# Patient Record
Sex: Male | Born: 1960
Health system: Southern US, Community
[De-identification: ages and names within clinical notes are randomized; demographics above are authoritative.]

## PROBLEM LIST (undated history)

## (undated) DIAGNOSIS — M0630 Rheumatoid nodule, unspecified site: Secondary | ICD-10-CM

## (undated) DIAGNOSIS — D696 Thrombocytopenia, unspecified: Secondary | ICD-10-CM

## (undated) DIAGNOSIS — Z973 Presence of spectacles and contact lenses: Secondary | ICD-10-CM

## (undated) DIAGNOSIS — R7303 Prediabetes: Secondary | ICD-10-CM

## (undated) DIAGNOSIS — E119 Type 2 diabetes mellitus without complications: Secondary | ICD-10-CM

## (undated) DIAGNOSIS — G629 Polyneuropathy, unspecified: Secondary | ICD-10-CM

## (undated) DIAGNOSIS — N529 Male erectile dysfunction, unspecified: Secondary | ICD-10-CM

## (undated) DIAGNOSIS — M8580 Other specified disorders of bone density and structure, unspecified site: Secondary | ICD-10-CM

## (undated) DIAGNOSIS — R7989 Other specified abnormal findings of blood chemistry: Secondary | ICD-10-CM

## (undated) DIAGNOSIS — M0510 Rheumatoid lung disease with rheumatoid arthritis of unspecified site: Secondary | ICD-10-CM

## (undated) DIAGNOSIS — M8589 Other specified disorders of bone density and structure, multiple sites: Secondary | ICD-10-CM

## (undated) DIAGNOSIS — K219 Gastro-esophageal reflux disease without esophagitis: Secondary | ICD-10-CM

## (undated) DIAGNOSIS — M159 Polyosteoarthritis, unspecified: Secondary | ICD-10-CM

## (undated) DIAGNOSIS — S92302G Fracture of unspecified metatarsal bone(s), left foot, subsequent encounter for fracture with delayed healing: Secondary | ICD-10-CM

## (undated) DIAGNOSIS — I499 Cardiac arrhythmia, unspecified: Secondary | ICD-10-CM

## (undated) DIAGNOSIS — K635 Polyp of colon: Secondary | ICD-10-CM

## (undated) DIAGNOSIS — E785 Hyperlipidemia, unspecified: Secondary | ICD-10-CM

## (undated) DIAGNOSIS — S82891A Other fracture of right lower leg, initial encounter for closed fracture: Secondary | ICD-10-CM

## (undated) DIAGNOSIS — M069 Rheumatoid arthritis, unspecified: Secondary | ICD-10-CM

## (undated) DIAGNOSIS — Z79899 Other long term (current) drug therapy: Secondary | ICD-10-CM

## (undated) DIAGNOSIS — E559 Vitamin D deficiency, unspecified: Secondary | ICD-10-CM

## (undated) DIAGNOSIS — E663 Overweight: Secondary | ICD-10-CM

## (undated) HISTORY — PX: COLONOSCOPY: SHX5424

## (undated) HISTORY — DX: Presence of spectacles and contact lenses: Z97.3

## (undated) HISTORY — DX: Other long term (current) drug therapy: Z79.899

## (undated) HISTORY — DX: Cardiac arrhythmia, unspecified: I49.9

## (undated) HISTORY — DX: Thrombocytopenia, unspecified: D69.6

## (undated) HISTORY — DX: Polyneuropathy, unspecified: G62.9

## (undated) HISTORY — DX: Overweight: E66.3

## (undated) HISTORY — DX: Prediabetes: R73.03

## (undated) HISTORY — DX: Rheumatoid nodule, unspecified site: M06.30

## (undated) HISTORY — DX: Male erectile dysfunction, unspecified: N52.9

## (undated) HISTORY — DX: Other specified abnormal findings of blood chemistry: R79.89

## (undated) HISTORY — DX: Hyperlipidemia, unspecified: E78.5

## (undated) HISTORY — DX: Polyp of colon: K63.5

## (undated) HISTORY — DX: Other specified disorders of bone density and structure, multiple sites: M85.89

## (undated) HISTORY — DX: Rheumatoid arthritis, unspecified: M06.9

## (undated) HISTORY — DX: Rheumatoid lung disease with rheumatoid arthritis of unspecified site: M05.10

## (undated) HISTORY — DX: Other fracture of right lower leg, initial encounter for closed fracture: S82.891A

## (undated) HISTORY — DX: Gastro-esophageal reflux disease without esophagitis: K21.9

## (undated) HISTORY — DX: Type 2 diabetes mellitus without complications: E11.9

## (undated) HISTORY — DX: Fracture of unspecified metatarsal bone(s), left foot, subsequent encounter for fracture with delayed healing: S92.302G

## (undated) HISTORY — PX: NO PAST SURGERIES: SHX2092

## (undated) HISTORY — DX: Polyosteoarthritis, unspecified: M15.9

## (undated) HISTORY — DX: Other specified disorders of bone density and structure, unspecified site: M85.80

## (undated) HISTORY — DX: Vitamin D deficiency, unspecified: E55.9

## (undated) HISTORY — PX: COLONOSCOPY: SHX174

---

## 1996-07-09 DIAGNOSIS — M069 Rheumatoid arthritis, unspecified: Secondary | ICD-10-CM | POA: Insufficient documentation

## 1996-07-09 HISTORY — DX: Rheumatoid arthritis, unspecified: M06.9

## 2007-07-10 DIAGNOSIS — G629 Polyneuropathy, unspecified: Secondary | ICD-10-CM

## 2007-07-10 DIAGNOSIS — I701 Atherosclerosis of renal artery: Secondary | ICD-10-CM | POA: Insufficient documentation

## 2007-07-10 HISTORY — DX: Polyneuropathy, unspecified: G62.9

## 2009-07-09 DIAGNOSIS — K635 Polyp of colon: Secondary | ICD-10-CM

## 2009-07-09 HISTORY — DX: Polyp of colon: K63.5

## 2010-08-10 ENCOUNTER — Ambulatory Visit (HOSPITAL_COMMUNITY): Payer: 59 | Attending: Internal Medicine

## 2010-08-10 DIAGNOSIS — M069 Rheumatoid arthritis, unspecified: Secondary | ICD-10-CM | POA: Insufficient documentation

## 2010-09-07 ENCOUNTER — Other Ambulatory Visit: Payer: Self-pay | Admitting: Internal Medicine

## 2010-09-07 ENCOUNTER — Encounter (HOSPITAL_COMMUNITY): Payer: 59 | Attending: Internal Medicine

## 2010-09-07 DIAGNOSIS — M069 Rheumatoid arthritis, unspecified: Secondary | ICD-10-CM | POA: Insufficient documentation

## 2010-09-07 LAB — CBC
MCH: 30.7 pg (ref 26.0–34.0)
MCHC: 34.3 g/dL (ref 30.0–36.0)
MCV: 89.4 fL (ref 78.0–100.0)
Platelets: 132 10*3/uL — ABNORMAL LOW (ref 150–400)
RDW: 13 % (ref 11.5–15.5)

## 2010-09-07 LAB — COMPREHENSIVE METABOLIC PANEL
AST: 28 U/L (ref 0–37)
Albumin: 3.7 g/dL (ref 3.5–5.2)
BUN: 16 mg/dL (ref 6–23)
Calcium: 8.9 mg/dL (ref 8.4–10.5)
Chloride: 109 mEq/L (ref 96–112)
Creatinine, Ser: 1 mg/dL (ref 0.4–1.5)
GFR calc Af Amer: >60 mL/min (ref >60)
Total Bilirubin: 1 mg/dL (ref 0.3–1.2)
Total Protein: 6.5 g/dL (ref 6.0–8.3)

## 2010-10-05 ENCOUNTER — Encounter (HOSPITAL_COMMUNITY): Payer: 59 | Attending: Internal Medicine

## 2010-10-05 DIAGNOSIS — M069 Rheumatoid arthritis, unspecified: Secondary | ICD-10-CM | POA: Insufficient documentation

## 2010-11-02 ENCOUNTER — Encounter (HOSPITAL_COMMUNITY): Payer: 59 | Attending: Internal Medicine

## 2010-11-02 ENCOUNTER — Other Ambulatory Visit: Payer: Self-pay | Admitting: Internal Medicine

## 2010-11-02 DIAGNOSIS — M069 Rheumatoid arthritis, unspecified: Secondary | ICD-10-CM | POA: Insufficient documentation

## 2010-11-02 LAB — COMPREHENSIVE METABOLIC PANEL
ALT: 41 U/L (ref 0–53)
AST: 32 U/L (ref 0–37)
CO2: 27 mEq/L (ref 19–32)
Chloride: 108 mEq/L (ref 96–112)
Creatinine, Ser: 1.17 mg/dL (ref 0.4–1.5)
GFR calc Af Amer: >60 mL/min (ref >60)
GFR calc non Af Amer: >60 mL/min (ref >60)
Glucose, Bld: 126 mg/dL — ABNORMAL HIGH (ref 70–99)
Sodium: 142 mEq/L (ref 135–145)
Total Bilirubin: 0.8 mg/dL (ref 0.3–1.2)

## 2010-11-02 LAB — CBC
HCT: 44.3 % (ref 39.0–52.0)
Hemoglobin: 15.6 g/dL (ref 13.0–17.0)
MCH: 30.6 pg (ref 26.0–34.0)
RBC: 5.09 MIL/uL (ref 4.22–5.81)

## 2010-11-30 ENCOUNTER — Encounter (HOSPITAL_COMMUNITY): Payer: 59

## 2010-12-06 ENCOUNTER — Encounter (HOSPITAL_COMMUNITY): Payer: 59 | Attending: Internal Medicine

## 2010-12-06 DIAGNOSIS — M069 Rheumatoid arthritis, unspecified: Secondary | ICD-10-CM | POA: Insufficient documentation

## 2011-01-03 ENCOUNTER — Ambulatory Visit (HOSPITAL_COMMUNITY): Payer: 59 | Attending: Internal Medicine

## 2011-01-03 DIAGNOSIS — M069 Rheumatoid arthritis, unspecified: Secondary | ICD-10-CM | POA: Insufficient documentation

## 2011-01-31 ENCOUNTER — Encounter (HOSPITAL_COMMUNITY): Payer: 59

## 2011-07-10 DIAGNOSIS — M8580 Other specified disorders of bone density and structure, unspecified site: Secondary | ICD-10-CM | POA: Insufficient documentation

## 2011-07-10 DIAGNOSIS — R7989 Other specified abnormal findings of blood chemistry: Secondary | ICD-10-CM

## 2011-07-10 DIAGNOSIS — D696 Thrombocytopenia, unspecified: Secondary | ICD-10-CM

## 2011-07-10 DIAGNOSIS — N529 Male erectile dysfunction, unspecified: Secondary | ICD-10-CM

## 2011-07-10 DIAGNOSIS — K219 Gastro-esophageal reflux disease without esophagitis: Secondary | ICD-10-CM | POA: Insufficient documentation

## 2011-07-10 HISTORY — DX: Gastro-esophageal reflux disease without esophagitis: K21.9

## 2011-07-10 HISTORY — DX: Male erectile dysfunction, unspecified: N52.9

## 2011-07-10 HISTORY — DX: Other specified abnormal findings of blood chemistry: R79.89

## 2011-07-10 HISTORY — DX: Other specified disorders of bone density and structure, unspecified site: M85.80

## 2011-07-10 HISTORY — DX: Thrombocytopenia, unspecified: D69.6

## 2014-03-09 DIAGNOSIS — S92302G Fracture of unspecified metatarsal bone(s), left foot, subsequent encounter for fracture with delayed healing: Secondary | ICD-10-CM

## 2014-03-09 HISTORY — DX: Fracture of unspecified metatarsal bone(s), left foot, subsequent encounter for fracture with delayed healing: S92.302G

## 2016-01-25 LAB — PSA: PSA: 0.6

## 2016-01-27 LAB — MICROALBUMIN, URINE: Microalb, Ur: 3

## 2016-04-18 LAB — TSH: TSH: 1.9 u[IU]/mL (ref <=5.90)

## 2016-07-30 DIAGNOSIS — E119 Type 2 diabetes mellitus without complications: Secondary | ICD-10-CM | POA: Diagnosis not present

## 2016-07-30 DIAGNOSIS — E785 Hyperlipidemia, unspecified: Secondary | ICD-10-CM | POA: Diagnosis not present

## 2016-07-30 LAB — BASIC METABOLIC PANEL
BUN: 20 mg/dL (ref 4–21)
CREATININE: 0.9 mg/dL (ref 0.6–1.3)
Glucose: 170 mg/dL

## 2016-07-30 LAB — LIPID PANEL
Cholesterol: 190 mg/dL (ref 0–200)
HDL: 35 mg/dL (ref 35–70)
LDL Cholesterol: 104 mg/dL
Triglycerides: 253 mg/dL — AB (ref 40–160)

## 2016-07-30 LAB — HEMOGLOBIN A1C: HEMOGLOBIN A1C: 6.9

## 2016-08-09 DIAGNOSIS — E119 Type 2 diabetes mellitus without complications: Secondary | ICD-10-CM | POA: Diagnosis not present

## 2016-08-09 DIAGNOSIS — Z23 Encounter for immunization: Secondary | ICD-10-CM | POA: Diagnosis not present

## 2016-08-09 DIAGNOSIS — M8580 Other specified disorders of bone density and structure, unspecified site: Secondary | ICD-10-CM | POA: Diagnosis not present

## 2016-08-09 DIAGNOSIS — E785 Hyperlipidemia, unspecified: Secondary | ICD-10-CM | POA: Diagnosis not present

## 2016-11-21 ENCOUNTER — Ambulatory Visit (INDEPENDENT_AMBULATORY_CARE_PROVIDER_SITE_OTHER): Payer: 59 | Admitting: Family Medicine

## 2016-11-21 ENCOUNTER — Encounter: Payer: Self-pay | Admitting: Family Medicine

## 2016-11-21 ENCOUNTER — Ambulatory Visit: Payer: Self-pay | Admitting: Family Medicine

## 2016-11-21 VITALS — BP 114/75 | HR 53 | Temp 98.1°F | Resp 20 | Ht 72.0 in | Wt 216.0 lb

## 2016-11-21 DIAGNOSIS — E785 Hyperlipidemia, unspecified: Secondary | ICD-10-CM

## 2016-11-21 DIAGNOSIS — Z6829 Body mass index (BMI) 29.0-29.9, adult: Secondary | ICD-10-CM

## 2016-11-21 DIAGNOSIS — E559 Vitamin D deficiency, unspecified: Secondary | ICD-10-CM

## 2016-11-21 DIAGNOSIS — E119 Type 2 diabetes mellitus without complications: Secondary | ICD-10-CM | POA: Diagnosis not present

## 2016-11-21 DIAGNOSIS — R7303 Prediabetes: Secondary | ICD-10-CM

## 2016-11-21 DIAGNOSIS — Z79899 Other long term (current) drug therapy: Secondary | ICD-10-CM | POA: Diagnosis not present

## 2016-11-21 DIAGNOSIS — M8580 Other specified disorders of bone density and structure, unspecified site: Secondary | ICD-10-CM | POA: Diagnosis not present

## 2016-11-21 DIAGNOSIS — D696 Thrombocytopenia, unspecified: Secondary | ICD-10-CM | POA: Diagnosis not present

## 2016-11-21 DIAGNOSIS — G609 Hereditary and idiopathic neuropathy, unspecified: Secondary | ICD-10-CM

## 2016-11-21 DIAGNOSIS — M059 Rheumatoid arthritis with rheumatoid factor, unspecified: Secondary | ICD-10-CM

## 2016-11-21 DIAGNOSIS — K219 Gastro-esophageal reflux disease without esophagitis: Secondary | ICD-10-CM

## 2016-11-21 NOTE — Patient Instructions (Signed)
It was a pleasure to meet you! I will call you with lab results. I have referred you to rheumatology for your RA.      Please help [place] help you:  We are honored you have chosen [facility] for your Primary Care home. Below you will find basic instructions that you may need to access in the future. Please help [place] help you by reading the instructions, which cover many of the frequent questions we experience.   Prescription refills and request:  -In order to allow more efficient response time, please call your pharmacy for all refills. They will forward the request electronically to [place]. This allows for the quickest possible response. Request left on a nurse line can take longer to refill, since these are checked as time allows between office patients and other phone calls.  - refill request can take up to 3-5 working days to complete.  - If request is sent electronically and request is appropiate, it is usually completed in 1-2 business days.  - all patients will need to be seen routinely for all chronic medical conditions requiring prescription medications (see follow-up below). If you are overdue for follow up on your condition, you will be asked to make an appointment and we will call in enough medication to cover you until your appointment (up to 30 days).  - all controlled substances will require a face to face visit to request/refill.  - if you desire your prescriptions to go through a new pharmacy, and have an active script at original pharmacy, you will need to call your pharmacy and have scripts transferred to new pharmacy. This is completed between the pharmacy locations and not by your provider.    Results: If any images or labs were ordered, it can take up to 1 week to get results depending on the test ordered and the lab/facility running and resulting the test. - Normal or stable results, which do not need further discussion, may be released to your mychart immediately with  attached note to you. A call may not be generated for normal results. Please make certain to sign up for mychart. If you have questions on how to activate your mychart you can call the front office.  - If your results need further discussion, our office will attempt to contact you via phone, and if unable to reach you after 2 attempts, we will release your abnormal result to your mychart with instructions.  - All results will be automatically released in mychart after 1 week.  - Your provider will provide you with explanation and instruction on all relevant material in your results. Please keep in mind, results and labs may appear confusing or abnormal to the untrained eye, but it does not mean they are actually abnormal for you personally. If you have any questions about your results that are not covered, or you desire more detailed explanation than what was provided, you should make an appointment with your provider to do so.   Our office handles many outgoing and incoming calls daily. If we have not contacted you within 1 week about your results, please check your mychart to see if there is a message first and if not, then contact our office.  In helping with this matter, you help decrease call volume, and therefore allow [place] to be able to respond to patients needs more efficiently.   Acute office visits (sick visit):  An acute visit is intended for a new problem and are scheduled in  shorter time slots to allow schedule openings for patients with new problems. This is the appropriate visit to discuss a new problem. In order to provide you with excellent quality medical care with proper time for you to explain your problem, have an exam and receive treatment with instructions, these appointments should be limited to one new problem per visit. If you experience a new problem, in which you desire to be addressed, please make an acute office visit, we save openings on the schedule to accommodate you. Please do  not save your new problem for any other type of visit, let us take care of it properly and quickly for you.   Follow up visits:  Depending on your condition(s) your provider will need to see you routinely in order to provide you with quality care and prescribe medication(s). Most chronic conditions (Example: hypertension, Diabetes, depression/anxiety... etc), require visits a couple times a year. Your provider will instruct you on proper follow up for your personal medical conditions and history. Please make certain to make follow up appointments for your condition as instructed. Failing to do so could result in lapse in your medication treatment/refills. If you request a refill, and are overdue to be seen on a condition, we will always provide you with a 30 day script (once) to allow you time to schedule.    Medicare wellness (well visit): - we have a wonderful Nurse Maudie Mercury), that will meet with you and provide you will yearly medicare wellness visits. These visits should occur yearly (can not be scheduled less than 1 calendar year apart) and cover preventive health, immunizations, advance directives and screenings you are entitled to yearly through your medicare benefits. Do not miss out on your entitled benefits, this is when medicare will pay for these benefits to be ordered for you.  These are strongly encouraged by your provider and is the appropriate type of visit to make certain you are up to date with all preventive health benefits. If you have not had your medicare wellness exam in the last 12 months, please make certain to schedule one by calling the office and schedule your medicare wellness with Maudie Mercury as soon as possible.   Yearly physical (well visit):  - Adults are recommended to be seen yearly for physicals. Check with your insurance and date of your last physical, most insurances require one calendar year between physicals. Physicals include all preventive health topics, screenings, medical  exam and labs that are appropriate for gender/age and history. You may have fasting labs needed at this visit. This is a well visit (not a sick visit), new problems should not be covered during this visit (see acute visit).  - Pediatric patients are seen more frequently when they are younger. Your provider will advise you on well child visit timing that is appropriate for your their age. - This is not a medicare wellness visit. Medicare wellness exams do not have an exam portion to the visit. Some medicare companies allow for a physical, some do not allow a yearly physical. If your medicare allows a yearly physical you can schedule the medicare wellness with our nurse Maudie Mercury and have your physical with your provider after, on the same day. Please check with insurance for your full benefits.   Late Policy/No Shows:  - all new patients should arrive 15-30 minutes earlier than appointment to allow [place] time  to  obtain all personal demographics,  insurance information and for you to complete office paperwork. - All established patients  should arrive 10-15 minutes earlier than appointment time to update all information and be checked in .  - In our best efforts to run on time, if you are late for your appointment you will be asked to either reschedule or if able, we will work you back into the schedule. There will be a wait time to work you back in the schedule,  depending on availability.  - If you are unable to make it to your appointment as scheduled, please call 24 hours ahead of time to allow [place] to fill the time slot with someone else who needs to be seen. If you do not cancel your appointment ahead of time, you may be charged a no show fee.

## 2016-11-21 NOTE — Progress Notes (Signed)
Patient ID: Bruce Kelly, male  DOB: 1960/12/02, 56 y.o.   MRN: [id number] Patient Care Team    Relationship Specialty Notifications Start End  [name], DO PCP - General Family Medicine  11/21/16   [facility]  Optometry  11/22/16     Chief Complaint  Patient presents with  . Establish Care    Subjective:  Bruce Kelly is a 56 y.o.  male present for new patient establishment. All past medical history, surgical history, allergies, family history, immunizations, medications and social history were obtained and updated in the electronic medical record today. No prior records have been  received at this time.  All recent labs, ED visits and hospitalizations within the last year were reviewed. Patient reports Dr. Alyson Ingles (his prior PCP) is out on medical leave for at least a year and he is in need of a new PCP.   Rheumatoid arthritis: Patient has a history of rheumatoid arthritis which started approximately 15 years ago. He reports approximately 10 year history of low dose prednisone use. He is currently on methotrexate 10 mg once a week, folic acid 1 mg daily, Golimumab 50 mg/0.5 mL injection every 30 days. He has followed faithfully with rheumatology at [facility], Dr. Dossie Der with other appointments/labs every 3 months. Patient feels his rheumatoid arthritis is rather controlled, but would like a referral to a new rheumatologist to establish. His next appointment would've been scheduled for June.   Prediabetes: Patient reports a history of "prediabetes". He states his last A1c was 6.9 in January 2018. He reports his A1c's ranged between 5.9-6.9. He has never been on medications, and controls with diet alone. He follows every 2 years with [facility] care.  Hyperlipidemia: Patient has a history of hyperlipidemia, currently prescribed Crestor 5 mg 3 times a week.  Osteopenia: 10/24/2011 bone density scan located within the care everywhere  TAB in the EMR resulted with osteopenia. Major osteoporotic fracture risk 13%, hip pressure 3.7%. Lumbar spine -1.5, left femoral neck -1.9. Patient reports he was on Boniva for 6 months, and supplements daily with vitamin D and calcium.  GERD: Patient has history of GERD currently takes Prilosec 20 mg daily and that is controlled.  Health maintenance:  Colonoscopy: completed ~2011, by a doctor at [facility] (does not recall name), resutls 3 polyps removed per patient. follow up patient states 10 years. Immunizations: Patient is uncertain his immunization record. Requesting prior records.  Infectious disease screening: HIV  and hep C screenings unknown. Would believe he has had them considering he's on methotrexate and immune modulator. DEXA: last completed April 2013, result osteopenia (-1.5 lumbar spine, -1.9 left hip). Patient reports 6 months use of Boniva, and now supplementing with vitamin D and calcium only. Should be monitored every 5-10 years. PSA: 0.600 01/25/2016  Depression screen PHQ 2/9 11/21/2016  Decreased Interest 0  Down, Depressed, Hopeless 0  PHQ - 2 Score 0   No flowsheet data found.   Current Exercise Habits: Structured exercise class, Type of exercise: strength training/weights, Time (Minutes): 30, Frequency (Times/Week): 2, Weekly Exercise (Minutes/Week): 60, Intensity: Mild Exercise limited by: None identified Fall Risk  11/21/2016  Falls in the past year? No   Immunization History  Administered Date(s) Administered  . Influenza-Unspecified 07/28/2015    No exam data present  Past Medical History:  Diagnosis Date  . Closed right ankle fracture   . Colon polyp 2011   x3 polyps, pts reports 10 year follow up  .  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 Elevated ferritin 2013   479 -->  return to normal  . Erectile dysfunction 2013   Had been on Cialis  . GERD (gastroesophageal reflux disease) 2013  . Hyperlipidemia LDL  goal <130   . Osteopenia 2013   prior h/o of Chronic steroids for 10 years; was on Boniva for 6 months. Now takes calcium/vitamin D daily.  . Peripheral neuropathy 2009   Did not respond to gabapentin by prior records  . Prediabetes   . Rheumatoid arthritis ([place]) 1998   had been on chronic steroids for about 10 years  . Rheumatoid lung ([place])    pt reports having thoracentesis for rheumatoid lung many years ago.   . Thrombocytopenia ([place]) 2013   Mildly low at 130  . Wears glasses    [facility]   Allergies  Allergen Reactions  . Penicillins     Doesn't know  . Sulfa Antibiotics     Doesn't know   Past Surgical History:  Procedure Laterality Date  . NO PAST SURGERIES     Family History  Problem Relation Age of Onset  . Arthritis Mother   . Diabetes Father   . Heart disease Father   . Arthritis Sister   . Diabetes Sister   . Alcohol abuse Brother   . Drug abuse Brother   . Breast cancer Paternal Aunt   . Early death Maternal Grandfather    Social History   Social History  . Marital status: Married    Spouse name: Mickel Baas  . Number of children: N/A  . Years of education: 59   Occupational History  . Not on file.   Social History Main Topics  . Smoking status: Former Smoker    Packs/day: 1.50    Years: 10.00    Types: Cigarettes    Quit date: 1999  . Smokeless tobacco: Never Used  . Alcohol use 2.4 oz/week    2 Cans of beer, 2 Shots of liquor per week  . Drug use: No  . Sexual activity: Yes    Partners: Female    Birth control/ protection: Pill   Other Topics Concern  . Not on file   Social History Narrative   Drinks caffeine, takes a daily vitamin.   Masters degree, Materials engineer.   Exercises routinely.   Wears his seatbelt, firearms locked in the home.   Feels  safe in his relationships.   Allergies as of 11/21/2016      Reactions   Penicillins    Doesn't know   Sulfa Antibiotics    Doesn't know      Medication List       Accurate as of  11/21/16 11:59 PM. Always use your most recent med list.          aspirin EC 81 MG tablet Take 81 mg by mouth daily.   b complex vitamins capsule Take 1 capsule by mouth daily.   calcium-vitamin D 500-200 MG-UNIT tablet Commonly known as:  OSCAL WITH D Take 1 tablet by mouth.   folic acid 1 MG tablet Commonly known as:  FOLVITE Take 1 tablet by mouth daily.   meloxicam 7.5 MG tablet Commonly known as:  MOBIC Take 2 tablets by mouth daily.   methotrexate 2.5 MG tablet Commonly known as:  RHEUMATREX Take 4 tablets by mouth once a week.   multivitamin capsule Take 1 capsule by mouth daily. Takes womens  for extra calcium   omeprazole 20 MG capsule Commonly known as:  PRILOSEC Take 1 capsule (20 mg total) by mouth daily.   rosuvastatin 5 MG tablet Commonly known as:  CRESTOR 3 times week   SIMPONI 50 MG/0.5ML Sosy Generic drug:  Golimumab Inject 50 Units into the skin every 30 (thirty) days.       All past medical history, surgical history, allergies, family history, immunizations andmedications were updated in the EMR today and reviewed under the history and medication portions of their EMR.    Recent Results (from the past 2160 hour(s))  Hemoglobin A1c     Status: Abnormal   Collection Time: 11/21/16  1:55 PM  Result Value Ref Range   Hgb A1c MFr Bld 7.5 (H) <5.7 %    Comment:   For someone without known diabetes, a hemoglobin A1c value of 6.5% or greater indicates that they may have diabetes and this should be confirmed with a follow-up test.   For someone with known diabetes, a value <7% indicates that their diabetes is well controlled and a value greater than or equal to 7% indicates suboptimal control. A1c targets should be individualized based on duration of diabetes, age, comorbid conditions, and other considerations.   Currently, no consensus exists for use of hemoglobin A1c for diagnosis of diabetes for children.      Mean Plasma Glucose 169 mg/dL     Patient was never admitted.   ROS: 14 pt review of systems performed and negative (unless mentioned in an HPI)  Objective: BP 114/75 (BP Location: Right Arm, Patient Position: Sitting, Cuff Size: Large)   Pulse (!) 53   Temp 98.1 F (36.7 C)   Resp 20   Ht 6' (1.829 m)   Wt 216 lb (98 kg)   SpO2 98%   BMI 29.29 kg/m  Gen: Afebrile. No acute distress. Nontoxic in appearance, well-developed, well-nourished,  pleasant Caucasian male HENT: AT. [place].  MMM, no oral lesions, adequate dentition.  Eyes:Pupils Equal Round Reactive to light, Extraocular movements intact,  Conjunctiva without redness, discharge or icterus. Neck/lymp/endocrine: Supple, no lymphadenopathy CV: RRR, no edema, +2/4 P posterior tibialis pulses. No carotid bruits. No JVD. Chest: CTAB, no wheeze, rhonchi or crackles.  Abd: Soft. NTND. BS present.  Skin: Warm and well-perfused. Skin intact. Neuro/Msk:  Normal gait. PERLA. EOMi. Alert. Oriented x3.   Psych: Normal affect, dress and demeanor. Normal speech. Normal thought content and judgment.   Assessment/plan: Bruce Kelly is a 56 y.o. male present for new patient establishment  Rheumatoid arthritis with positive rheumatoid factor, involving unspecified site [facility]) High-risk medication use-methotrexate - Approximately 15-20 year history of rheumatoid arthritis, with approximately 10 year history of steroid use. - Continue methotrexate 10 mg weekly, folate 1 mg daily,Golimumab monthly injections. - Patient desires referral to a new rheumatologist, will placed today and hopefully get him in before June which would've been his normal follow-up time. - Ambulatory referral to Rheumatology  Prediabetes/BMI 29--> type 2 diabetes Patient reports prediabetes history, KPN has a diagnosis of type 2 diabetes. With last A1c in January 2018 6.9,  meeting criteria. Patient reports he is diet controlled, no meds at this time.  Idiopathic peripheral neuropathy Seems to be  long-standing since 2009.  Did not respond to gabapentin by prior records.  Osteopenia, unspecified location/vitamin D deficiency DEXA scan April 2013, likely needs repeated every 5-10 years. Continue calcium and vitamin D supplementation Monitor vitamin D at physical yearly  Hyperlipidemia LDL goal <130 Last labs  January 2018 with continued elevated cholesterol. Had been intolerant to Lipitor. - rosuvastatin (CRESTOR) 5 MG tablet; 3 times week  Dispense: 45 tablet; Refill: 0  Gastroesophageal reflux disease, esophagitis presence not specified - omeprazole (PRILOSEC) 20 MG capsule; Take 1 capsule (20 mg total) by mouth daily.  Dispense: 90 capsule; Refill: 0   Note is dictated utilizing voice recognition software. Although note has been proof read prior to signing, occasional typographical errors still can be missed. If any questions arise, please do not hesitate to call for verification.   Greater than 45 minutes was spent with patient, greater than 50% of that time was spent face-to-face with patient counseling and/or coordinating care.    Electronically signed by: Howard Pouch, DO [place]

## 2016-11-22 ENCOUNTER — Encounter: Payer: Self-pay | Admitting: *Deleted

## 2016-11-22 ENCOUNTER — Telehealth: Payer: Self-pay | Admitting: Family Medicine

## 2016-11-22 ENCOUNTER — Encounter: Payer: Self-pay | Admitting: Family Medicine

## 2016-11-22 DIAGNOSIS — R7303 Prediabetes: Secondary | ICD-10-CM | POA: Insufficient documentation

## 2016-11-22 DIAGNOSIS — E119 Type 2 diabetes mellitus without complications: Secondary | ICD-10-CM | POA: Insufficient documentation

## 2016-11-22 DIAGNOSIS — E663 Overweight: Secondary | ICD-10-CM

## 2016-11-22 DIAGNOSIS — E1169 Type 2 diabetes mellitus with other specified complication: Secondary | ICD-10-CM | POA: Insufficient documentation

## 2016-11-22 DIAGNOSIS — E559 Vitamin D deficiency, unspecified: Secondary | ICD-10-CM | POA: Insufficient documentation

## 2016-11-22 DIAGNOSIS — E785 Hyperlipidemia, unspecified: Secondary | ICD-10-CM | POA: Insufficient documentation

## 2016-11-22 DIAGNOSIS — Z79899 Other long term (current) drug therapy: Secondary | ICD-10-CM | POA: Insufficient documentation

## 2016-11-22 HISTORY — DX: Overweight: E66.3

## 2016-11-22 HISTORY — DX: Type 2 diabetes mellitus without complications: E11.9

## 2016-11-22 HISTORY — DX: Vitamin D deficiency, unspecified: E55.9

## 2016-11-22 LAB — HEMOGLOBIN A1C
HEMOGLOBIN A1C: 7.5 % — AB (ref <5.7)
Mean Plasma Glucose: 169 mg/dL

## 2016-11-22 MED ORDER — OMEPRAZOLE 20 MG PO CPDR: 20.0000 mg | DELAYED_RELEASE_CAPSULE | Freq: Every day | ORAL | 0 refills | Status: DC

## 2016-11-22 MED ORDER — ROSUVASTATIN CALCIUM 5 MG PO TABS: ORAL_TABLET | ORAL | 0 refills | Status: DC

## 2016-11-22 NOTE — Telephone Encounter (Signed)
Please call pt: - he established yesterday with this office and A1c was obtained.  - His A1c results are 7.5. I am sorry to report to him this is definitely diabetic range and would recommend not only diet lower in sugar and carbohydrates, increasing exercise, but also starting a medication called metformin. I understand this may be a little overwhelming for him. - I would also encourage him to attend a diabetes education class. If he is interested in this, I will place a referral for him. - If he is agreeable to starting metformin, I will call in the starter/lower dose and we will follow-up in 3 months on his diabetes with repeat A1c. Does he have a local pharmacy he would like [place] to call this into while we taper dose? - If he is agreeable to starting medications, please call in a blood glucose monitor/kit and please have him start checking his blood glucoses fasting in the morning and record these readings, and bring them to his next office visit in 3 months. If he is uncertain how to check his blood glucoses, he can come in for a nurse visit for them to instruct or diabetes education would likely also instruct him. - If he would like to discuss lab, other medications etc. prior to start, he can schedule an appointment to discuss.

## 2016-11-22 NOTE — Telephone Encounter (Signed)
Spoke with patient he is willing to start metformin and attend a diabetes education class. He states he already has a glucometer and will start recording readings.

## 2016-11-23 MED ORDER — METFORMIN HCL 500 MG PO TABS: 500.0000 mg | ORAL_TABLET | Freq: Two times a day (BID) | ORAL | 0 refills | Status: DC

## 2016-11-23 NOTE — Telephone Encounter (Signed)
metformin 500 mg BID prescribed.  DM education referral placed.  F/U 3 months and will taper metformin as needed.

## 2016-11-23 NOTE — Telephone Encounter (Signed)
Patient aware.

## 2016-12-20 ENCOUNTER — Encounter: Payer: Self-pay | Admitting: Registered"

## 2016-12-20 ENCOUNTER — Encounter: Payer: 59 | Attending: Family Medicine | Admitting: Registered"

## 2016-12-20 DIAGNOSIS — E119 Type 2 diabetes mellitus without complications: Secondary | ICD-10-CM | POA: Diagnosis present

## 2016-12-20 DIAGNOSIS — E118 Type 2 diabetes mellitus with unspecified complications: Secondary | ICD-10-CM

## 2016-12-20 DIAGNOSIS — Z713 Dietary counseling and surveillance: Secondary | ICD-10-CM | POA: Insufficient documentation

## 2016-12-20 NOTE — Progress Notes (Signed)
Diabetes Self-Management Education  Visit Type: First/Initial  Appt. Start Time: 1440 pm Appt. End Time: 1600  12/20/2016  Mr. Bruce Kelly, identified by name and date of birth, is a 56 y.o. male with a diagnosis of Diabetes: Type 2.   ASSESSMENT Pt states he wants to keep BG under control and not have to take medication if possible. Pt presented log of his weight and BG he has been tracking since May 18. In the last couple of weeks his fasting numbers have been low to mid 100s. Pt states he has made a lot of changes since getting this diagnosis. Pt states he avoids carbs, cut back on beer, and quit eating donuts at work. Pt states he has lost 11 lbs.    Pt states his medication, methyltrexate, makes it hard for his body to heal and RD advised that he start daily foot exams.  Pt states he would like to be more active than he is, but is limited by pain due to RA. Pt does find that the recumbant bike is easier on his body, but has a hard time finding time to go to gym. Patient states he gets daily activity walking his dog and walking around during work.  Individualized Plan for Diabetes Self-Management Training:   Learning Objective:  Patient will have a greater understanding of diabetes self-management. Patient education plan is to attend individual and/or group sessions per assessed needs and concerns.  Patient Instructions  Plan:  Aim for 3-4 Carb Choices per meal (45-60 grams)   Aim for 0-2 Carbs per snack if hungry  Include protein iwith your meals and snacks Consider reading food labels for Total Carbohydrate Continue daily activity as tolerated Continue checking BG at alternate times per day as directed by MD  Continue taking medication as directed by MD Consider taking omega 3 supplement - look into [employer] work on making all your healthy lifestyle changes  Expected Outcomes:  Demonstrated interest in learning. Expect positive outcomes  Education material  provided: Living Well with Diabetes, A1C conversion sheet, My Plate and Carbohydrate counting sheet  If problems or questions, patient to contact team via:  Phone and MyChart  Future DSME appointment: PRN

## 2016-12-20 NOTE — Patient Instructions (Signed)
Plan:  Aim for 3-4 Carb Choices per meal (45-60 grams)   Aim for 0-2 Carbs per snack if hungry  Include protein iwith your meals and snacks Consider reading food labels for Total Carbohydrate Continue daily activity as tolerated Continue checking BG at alternate times per day as directed by MD  Continue taking medication as directed by MD Consider taking omega 3 supplement - look into [employer] work on making all your healthy lifestyle changes

## 2017-01-15 ENCOUNTER — Other Ambulatory Visit: Payer: Self-pay | Admitting: Family Medicine

## 2017-01-15 DIAGNOSIS — K219 Gastro-esophageal reflux disease without esophagitis: Secondary | ICD-10-CM

## 2017-01-16 ENCOUNTER — Other Ambulatory Visit: Payer: Self-pay | Admitting: Family Medicine

## 2017-01-16 DIAGNOSIS — M150 Primary generalized (osteo)arthritis: Secondary | ICD-10-CM | POA: Diagnosis not present

## 2017-01-16 DIAGNOSIS — Z79899 Other long term (current) drug therapy: Secondary | ICD-10-CM | POA: Diagnosis not present

## 2017-01-16 DIAGNOSIS — M0579 Rheumatoid arthritis with rheumatoid factor of multiple sites without organ or systems involvement: Secondary | ICD-10-CM | POA: Diagnosis not present

## 2017-02-14 ENCOUNTER — Other Ambulatory Visit: Payer: Self-pay | Admitting: Family Medicine

## 2017-03-05 LAB — HM DIABETES EYE EXAM

## 2017-03-08 ENCOUNTER — Encounter: Payer: Self-pay | Admitting: *Deleted

## 2017-04-05 ENCOUNTER — Other Ambulatory Visit: Payer: Self-pay | Admitting: Family Medicine

## 2017-04-05 DIAGNOSIS — K219 Gastro-esophageal reflux disease without esophagitis: Secondary | ICD-10-CM

## 2017-04-05 MED ORDER — METFORMIN HCL 500 MG PO TABS: 500.0000 mg | ORAL_TABLET | Freq: Two times a day (BID) | ORAL | 0 refills | Status: DC

## 2017-04-05 NOTE — Telephone Encounter (Signed)
Spoke with patient scheduled follow up appt. Patient states he has been out of metformin for awhile. Sent 30 day supply to patients local pharmacy.

## 2017-04-05 NOTE — Telephone Encounter (Signed)
Please call pt and schedule an appt for his new onset diabetes follow up.  He was seen for establishment 11/21/2016, diagnosed with new diabetes, started on metformin. He needs to have his a1c completed, preferably before medication refill so we can adjust the dose if needed. If he is out we can send in a 30 day only.  I do not see an appt scheduled and I have received a refill request.

## 2017-04-08 ENCOUNTER — Ambulatory Visit: Payer: 59 | Admitting: Family Medicine

## 2017-04-15 ENCOUNTER — Ambulatory Visit (INDEPENDENT_AMBULATORY_CARE_PROVIDER_SITE_OTHER): Payer: 59 | Admitting: Family Medicine

## 2017-04-15 ENCOUNTER — Encounter: Payer: Self-pay | Admitting: Family Medicine

## 2017-04-15 VITALS — BP 116/75 | HR 50 | Temp 98.2°F | Resp 20 | Ht 72.0 in | Wt 193.8 lb

## 2017-04-15 DIAGNOSIS — E119 Type 2 diabetes mellitus without complications: Secondary | ICD-10-CM | POA: Diagnosis not present

## 2017-04-15 DIAGNOSIS — E785 Hyperlipidemia, unspecified: Secondary | ICD-10-CM

## 2017-04-15 DIAGNOSIS — Z23 Encounter for immunization: Secondary | ICD-10-CM | POA: Diagnosis not present

## 2017-04-15 DIAGNOSIS — Z6826 Body mass index (BMI) 26.0-26.9, adult: Secondary | ICD-10-CM

## 2017-04-15 DIAGNOSIS — Z79899 Other long term (current) drug therapy: Secondary | ICD-10-CM | POA: Diagnosis not present

## 2017-04-15 HISTORY — DX: Other long term (current) drug therapy: Z79.899

## 2017-04-15 LAB — HEPATIC FUNCTION PANEL
ALT: 22 U/L (ref 0–53)
AST: 20 U/L (ref 0–37)
Albumin: 4.2 g/dL (ref 3.5–5.2)
Alkaline Phosphatase: 66 U/L (ref 39–117)
BILIRUBIN DIRECT: 0.1 mg/dL (ref 0.0–0.3)
BILIRUBIN TOTAL: 1 mg/dL (ref 0.2–1.2)
Total Protein: 7.1 g/dL (ref 6.0–8.3)

## 2017-04-15 LAB — LIPID PANEL
CHOLESTEROL: 179 mg/dL (ref 0–200)
HDL: 46.1 mg/dL (ref >39.00)
LDL Cholesterol: 100 mg/dL — ABNORMAL HIGH (ref 0–99)
NONHDL: 133.02
Total CHOL/HDL Ratio: 4
Triglycerides: 163 mg/dL — ABNORMAL HIGH (ref 0.0–149.0)
VLDL: 32.6 mg/dL (ref 0.0–40.0)

## 2017-04-15 MED ORDER — BLOOD GLUCOSE METER KIT: PACK | 0 refills | Status: DC

## 2017-04-15 MED ORDER — ROSUVASTATIN CALCIUM 5 MG PO TABS: ORAL_TABLET | ORAL | 3 refills | Status: DC

## 2017-04-15 MED ORDER — METFORMIN HCL 500 MG PO TABS: 500.0000 mg | ORAL_TABLET | Freq: Two times a day (BID) | ORAL | 1 refills | Status: DC

## 2017-04-15 NOTE — Progress Notes (Signed)
.     Patient ID: Bruce Kelly, male  DOB: 08/25/1960, 56 y.o.   MRN: [id number] Patient Care Team    Relationship Specialty Notifications Start End  [name], DO PCP - General Family Medicine  11/21/16   [facility]  11/22/16     Chief Complaint  Patient presents with  . Diabetes    Subjective:  Bruce Kelly is a 56 y.o.  male present for   Diabetes: Patient with a history of prediabetes prior to May. Diagnosed with diabetes May 2018. He has attended the nutrition classes. He reports compliance with metformin 500 mg twice a day, with the exception of the 2 weeks he was out of the medication.  Denies numbness, tingling of extremities, hypo/hyperglycemic events or non-healing wounds. Pt reports BG ranges 74-173. Averaging approximately 110-120. Patient has been strictly watching his diet and exercising routinely. He has lost almost 30 pounds. PNA series: We'll offer her next appointment Flu shot: Flu shot completed today. (recommneded yearly) BMP: Within normal limits, completed at his rheumatologist. Foot exam: We'll complete next visit Eye exam: 03/05/2017, [facility]. A1c: 7.5 --> 5.8 today.   Hyperlipidemia: Patient has a history of hyperlipidemia with elevated triglycerides, currently prescribed Crestor 5 mg 3 times a week and has started fish oil since cholesterol was checked in January 2018. He is dieting, exercising and has lost 30 pounds.    Depression screen [facility] 2/9 04/15/2017 12/20/2016 11/21/2016  Decreased Interest 0 0 0  Down, Depressed, Hopeless 0 0 0  PHQ - 2 Score 0 0 0   No flowsheet data found.    Fall Risk  12/20/2016 11/21/2016  Falls in the past year? No No   Immunization History  Administered Date(s) Administered  . Influenza,inj,Quad PF,6+ Mos 04/15/2017  . Influenza-Unspecified 07/28/2015    No exam data present  Past Medical History:  Diagnosis Date  . Closed right ankle fracture   . Colon  polyp 2011   x3 polyps, pts reports 10 year follow up  . 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 Elevated ferritin 2013   479 -->  return to normal  . Erectile dysfunction 2013   Had been on Cialis  . GERD (gastroesophageal reflux disease) 2013  . Hyperlipidemia LDL goal <130   . Osteopenia 2013   prior h/o of Chronic steroids for 10 years; was on Boniva for 6 months. Now takes calcium/vitamin D daily.  . Peripheral neuropathy 2009   Did not respond to gabapentin by prior records  . Prediabetes   . Rheumatoid arthritis ([place]) 1998   had been on chronic steroids for about 10 years  . Rheumatoid lung ([place])    pt reports having thoracentesis for rheumatoid lung many years ago.   . Thrombocytopenia ([place]) 2013   Mildly low at 130  . Wears glasses    [facility]   Allergies  Allergen Reactions  . Penicillins     Doesn't know  . Sulfa Antibiotics     Doesn't know   Past Surgical History:  Procedure Laterality Date  . NO PAST SURGERIES     Family History  Problem Relation Age of Onset  . Arthritis Mother   . Diabetes Father   . Heart disease Father   . Arthritis Sister   . Diabetes Sister   . Alcohol abuse Brother   . Drug abuse Brother   . Breast cancer Paternal  Aunt   . Early death Maternal Grandfather    Social History   Social History  . Marital status: Married    Spouse name: Bruce Kelly  . Number of children: N/A  . Years of education: 32   Occupational History  . Not on file.   Social History Main Topics  . Smoking status: Former Smoker    Packs/day: 1.50    Years: 10.00    Types: Cigarettes    Quit date: 1999  . Smokeless tobacco: Never Used  . Alcohol use 2.4 oz/week    2 Cans of beer, 2 Shots of liquor per week  . Drug use: No  . Sexual activity: Yes    Partners: Female    Birth control/ protection: Pill   Other Topics Concern  . Not on file   Social History Narrative   Drinks caffeine, takes a daily  vitamin.   Masters degree, Materials engineer.   Exercises routinely.   Wears his seatbelt, firearms locked in the home.   Feels  safe in his relationships.   Allergies as of 04/15/2017      Reactions   Penicillins    Doesn't know   Sulfa Antibiotics    Doesn't know      Medication List       Accurate as of 04/15/17 11:59 PM. Always use your most recent med list.          aspirin EC 81 MG tablet Take 81 mg by mouth daily.   b complex vitamins capsule Take 1 capsule by mouth daily.   blood glucose meter kit and supplies Dispense based on patient and insurance  Check blood sugar once daily.   calcium-vitamin D 500-200 MG-UNIT tablet Commonly known as:  OSCAL WITH D Take 1 tablet by mouth.   folic acid 1 MG tablet Commonly known as:  FOLVITE Take 1 tablet by mouth daily.   meloxicam 7.5 MG tablet Commonly known as:  MOBIC Take 2 tablets by mouth daily.   metFORMIN 500 MG tablet Commonly known as:  GLUCOPHAGE Take 1 tablet (500 mg total) by mouth 2 (two) times daily with a meal.   methotrexate 2.5 MG tablet Commonly known as:  RHEUMATREX Take 4 tablets by mouth once a week.   multivitamin capsule Take 1 capsule by mouth daily. Takes womens for extra calcium   omeprazole 20 MG capsule Commonly known as:  PRILOSEC TAKE 1 CAPSULE BY MOUTH  DAILY   rosuvastatin 5 MG tablet Commonly known as:  CRESTOR 3 times week   SIMPONI 50 MG/0.5ML Sosy Generic drug:  Golimumab Inject 50 Units into the skin every 30 (thirty) days.       All past medical history, surgical history, allergies, family history, immunizations andmedications were updated in the EMR today and reviewed under the history and medication portions of their EMR.    Recent Results (from the past 2160 hour(s))  HM DIABETES EYE EXAM     Status: None   Collection Time: 03/05/17 12:00 AM  Result Value Ref Range   HM Diabetic Eye Exam No Retinopathy No Retinopathy  Lipid panel     Status: Abnormal    Collection Time: 04/15/17  1:36 PM  Result Value Ref Range   Cholesterol 179 0 - 200 mg/dL    Comment: ATP III Classification       Desirable:  < 200 mg/dL               Borderline High:  200 - 239  mg/dL          High:  > = 240 mg/dL   Triglycerides 163.0 (H) 0.0 - 149.0 mg/dL    Comment: Normal:  <150 mg/dLBorderline High:  150 - 199 mg/dL   HDL 46.10 >39.00 mg/dL   VLDL 32.6 0.0 - 40.0 mg/dL   LDL Cholesterol 100 (H) 0 - 99 mg/dL   Total CHOL/HDL Ratio 4     Comment:                Men          Women1/2 Average Risk     3.4          3.3Average Risk          5.0          4.42X Average Risk          9.6          7.13X Average Risk          15.0          11.0                       NonHDL 133.02     Comment: NOTE:  Non-HDL goal should be 30 mg/dL higher than patient's LDL goal (i.e. LDL goal of < 70 mg/dL, would have non-HDL goal of < 100 mg/dL)  Hepatic function panel     Status: None   Collection Time: 04/15/17  1:36 PM  Result Value Ref Range   Total Bilirubin 1.0 0.2 - 1.2 mg/dL   Bilirubin, Direct 0.1 0.0 - 0.3 mg/dL   Alkaline Phosphatase 66 39 - 117 U/L   AST 20 0 - 37 U/L   ALT 22 0 - 53 U/L   Total Protein 7.1 6.0 - 8.3 g/dL   Albumin 4.2 3.5 - 5.2 g/dL    Patient was never admitted.   ROS: 14 pt review of systems performed and negative (unless mentioned in an HPI)  Objective: BP 116/75 (BP Location: Right Arm, Patient Position: Sitting, Cuff Size: Large)   Pulse (!) 50   Temp 98.2 F (36.8 C)   Resp 20   Ht 6' (1.829 m)   Wt 193 lb 12 oz (87.9 kg)   SpO2 97%   BMI 26.28 kg/m  Gen: Afebrile. No acute distress.  HENT: AT. [place].  MMM.  Eyes:Pupils Equal Round Reactive to light, Extraocular movements intact,  Conjunctiva without redness, discharge or icterus. CV: RRR no murmur, no edema, +2/4 P posterior tibialis pulses Chest: CTAB, no wheeze or crackles Abd: Soft. NTND. BS present. No Masses palpated.  Skin: No rashes, purpura or petechiae. Intact. Neuro:  Normal  gait. PERLA. EOMi. Alert. Oriented.   Assessment/plan: Bruce Kelly is a 56 y.o. male present for  type 2 diabetes - A1c 5.8 today. Continue with appointment 500 mg twice a day. - Doing great with diet and exercise has lost almost 30 pounds. - Glucose monitor prescribed for him today with supplies. - Continue checking fasting glucose couple times a week. - Flu shot completed today. - Pneumonia series will be started next office visit. Patient is interested. - Microalbumin and foot exam will Be completed next office visit. - Follow-up 4 months   Hyperlipidemia LDL goal <130/statin use. - Recheck lipids and liver function today. - Continue fish oil supplementation. - Continue: rosuvastatin (CRESTOR) 5 MG tablet; 3 times week  Dispense: 45 tablet; Refill: 0     Note  is dictated utilizing voice recognition software. Although note has been proof read prior to signing, occasional typographical errors still can be missed. If any questions arise, please do not hesitate to call for verification.    Electronically signed by: Howard Pouch, DO [place]

## 2017-04-15 NOTE — Patient Instructions (Signed)
We will call you with lab results once available.  New glucose monitor prescribed for you.   Follow up in 4 months.   Your a1c is 5.8 today. This is really good. Great job on the weight loss and exercising.

## 2017-04-16 LAB — POCT GLYCOSYLATED HEMOGLOBIN (HGB A1C): HEMOGLOBIN A1C: 5.8

## 2017-06-03 DIAGNOSIS — M0579 Rheumatoid arthritis with rheumatoid factor of multiple sites without organ or systems involvement: Secondary | ICD-10-CM | POA: Diagnosis not present

## 2017-06-03 DIAGNOSIS — Z79899 Other long term (current) drug therapy: Secondary | ICD-10-CM | POA: Diagnosis not present

## 2017-06-03 DIAGNOSIS — M150 Primary generalized (osteo)arthritis: Secondary | ICD-10-CM | POA: Diagnosis not present

## 2017-06-05 ENCOUNTER — Other Ambulatory Visit: Payer: Self-pay | Admitting: Physician Assistant

## 2017-06-05 DIAGNOSIS — M8589 Other specified disorders of bone density and structure, multiple sites: Secondary | ICD-10-CM

## 2017-08-14 ENCOUNTER — Ambulatory Visit: Payer: 59 | Admitting: Family Medicine

## 2017-08-27 ENCOUNTER — Ambulatory Visit: Payer: 59 | Admitting: Family Medicine

## 2017-08-29 ENCOUNTER — Encounter: Payer: Self-pay | Admitting: Family Medicine

## 2017-08-29 ENCOUNTER — Ambulatory Visit (INDEPENDENT_AMBULATORY_CARE_PROVIDER_SITE_OTHER): Payer: 59 | Admitting: Family Medicine

## 2017-08-29 VITALS — BP 112/75 | HR 63 | Temp 97.5°F | Ht 72.0 in | Wt 197.1 lb

## 2017-08-29 DIAGNOSIS — E785 Hyperlipidemia, unspecified: Secondary | ICD-10-CM

## 2017-08-29 DIAGNOSIS — R000 Tachycardia, unspecified: Secondary | ICD-10-CM | POA: Diagnosis not present

## 2017-08-29 DIAGNOSIS — E663 Overweight: Secondary | ICD-10-CM | POA: Diagnosis not present

## 2017-08-29 DIAGNOSIS — Z79899 Other long term (current) drug therapy: Secondary | ICD-10-CM

## 2017-08-29 DIAGNOSIS — Z23 Encounter for immunization: Secondary | ICD-10-CM | POA: Diagnosis not present

## 2017-08-29 DIAGNOSIS — E119 Type 2 diabetes mellitus without complications: Secondary | ICD-10-CM | POA: Diagnosis not present

## 2017-08-29 LAB — POCT GLYCOSYLATED HEMOGLOBIN (HGB A1C): HEMOGLOBIN A1C: 6.4

## 2017-08-29 LAB — MICROALBUMIN / CREATININE URINE RATIO
Creatinine,U: 130.3 mg/dL
MICROALB/CREAT RATIO: 0.5 mg/g (ref 0.0–30.0)
Microalb, Ur: <0.7 mg/dL (ref 0.0–1.9)

## 2017-08-29 MED ORDER — METFORMIN HCL 500 MG PO TABS: 500.0000 mg | ORAL_TABLET | Freq: Two times a day (BID) | ORAL | 1 refills | Status: DC

## 2017-08-29 NOTE — Patient Instructions (Addendum)
Get back to diet and exercise. Your a1c increased to 6.4 today.  Keep medicine the same for now.  Keep track of your heart racing symptoms and if worsening come back sooner. Otherwise we can address at your next visit with your recordings.

## 2017-08-29 NOTE — Progress Notes (Signed)
.     Patient ID: Bruce Kelly, male  DOB: 1960-07-26, 57 y.o.   MRN: [id number] Patient Care Team    Relationship Specialty Notifications Start End  [name], DO PCP - General Family Medicine  11/21/16   [facility]  11/22/16     Chief Complaint  Patient presents with  . Follow-up    DM    Subjective:  Bruce Kelly is a 57 y.o.  male present for   Diabetes:  Diagnosed with diabetes May 2018, prediabetic for some time. He has attended the nutrition classes. He reports he he has been forgetting to take his metformin every once in a while and not following his diet as good as he was prior to his last visit. Patient denies dizziness, hyperglycemic or hypoglycemic events. Patient denies changes numbness, tingling in the extremities or nonhealing wounds of feet. He has mild tingling in bilateral large toes. He has declined medications for this at this time. PNA series: Prevnar 13 provided today 08/29/2017, Pneumovax will be offered in one year Flu shot: Flu shot completed 2018 (recommneded yearly) BMP: Within normal limits, completed at his rheumatologist. Foot exam: Completed today 08/29/2017 Eye exam: 03/05/2017, [facility]. A1c: 7.5 --> 5.8 --> 6.4 today.   Hyperlipidemia/heart racing: Patient has a history of hyperlipidemia with elevated triglycerides, currently prescribed Crestor 5 mg 3 times a week and has started fish oil since cholesterol. He is tolerating meds. Patient reports he has had sensations of his heart racing approximately 1-2x months, last about 1-2 hours. Saw cardiology  2 years ago for this condition and he was told all studies were normal. He reports having a stress test at that time. He denies any associated symptoms with his heart racing, no chest pain, no dizziness, no syncope, no shortness of breath.   Depression screen [facility] 2/9 04/15/2017 12/20/2016 11/21/2016  Decreased Interest 0 0 0  Down, Depressed, Hopeless 0  0 0  PHQ - 2 Score 0 0 0   No flowsheet data found.    Fall Risk  12/20/2016 11/21/2016  Falls in the past year? No No   Immunization History  Administered Date(s) Administered  . Influenza,inj,Quad PF,6+ Mos 04/15/2017  . Influenza-Unspecified 07/28/2015  . Pneumococcal Conjugate-13 08/29/2017    No exam data present  Past Medical History:  Diagnosis Date  . Closed right ankle fracture   . Colon polyp 2011   x3 polyps, pts reports 10 year follow up  . 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 Elevated ferritin 2013   479 -->  return to normal  . Erectile dysfunction 2013   Had been on Cialis  . GERD (gastroesophageal reflux disease) 2013  . Hyperlipidemia LDL goal <130   . Osteopenia 2013   prior h/o of Chronic steroids for 10 years; was on Boniva for 6 months. Now takes calcium/vitamin D daily.  . Peripheral neuropathy 2009   Did not respond to gabapentin by prior records  . Prediabetes   . Rheumatoid arthritis ([place]) 1998   had been on chronic steroids for about 10 years  . Rheumatoid lung ([place])    pt reports having thoracentesis for rheumatoid lung many years ago.   . Thrombocytopenia ([place]) 2013   Mildly low at 130  . Wears glasses    [facility]   Allergies  Allergen Reactions  . Penicillins     Doesn't know  . Sulfa  Antibiotics     Doesn't know   Past Surgical History:  Procedure Laterality Date  . NO PAST SURGERIES     Family History  Problem Relation Age of Onset  . Arthritis Mother   . Diabetes Father   . Heart disease Father   . Arthritis Sister   . Diabetes Sister   . Alcohol abuse Brother   . Drug abuse Brother   . Breast cancer Paternal Aunt   . Early death Maternal Grandfather    Social History   Socioeconomic History  . Marital status: Married    Spouse name: Mickel Baas  . Number of children: Not on file  . Years of education: 34  . Highest education level: Not on file  Social Needs  .  Financial resource strain: Not on file  . Food insecurity - worry: Not on file  . Food insecurity - inability: Not on file  . Transportation needs - medical: Not on file  . Transportation needs - non-medical: Not on file  Occupational History  . Not on file  Tobacco Use  . Smoking status: Former Smoker    Packs/day: 1.50    Years: 10.00    Pack years: 15.00    Types: Cigarettes    Last attempt to quit: 1999    Years since quitting: 20.1  . Smokeless tobacco: Never Used  Substance and Sexual Activity  . Alcohol use: Yes    Alcohol/week: 2.4 oz    Types: 2 Cans of beer, 2 Shots of liquor per week  . Drug use: No  . Sexual activity: Yes    Partners: Female    Birth control/protection: Pill  Other Topics Concern  . Not on file  Social History Narrative   Drinks caffeine, takes a daily vitamin.   Masters degree, Materials engineer.   Exercises routinely.   Wears his seatbelt, firearms locked in the home.   Feels  safe in his relationships.   Allergies as of 08/29/2017      Reactions   Penicillins    Doesn't know   Sulfa Antibiotics    Doesn't know      Medication List        Accurate as of 08/29/17 12:56 PM. Always use your most recent med list.          aspirin EC 81 MG tablet Take 81 mg by mouth daily.   b complex vitamins capsule Take 1 capsule by mouth daily.   blood glucose meter kit and supplies Dispense based on patient and insurance  Check blood sugar once daily.   calcium-vitamin D 500-200 MG-UNIT tablet Commonly known as:  OSCAL WITH D Take 1 tablet by mouth.   folic acid 1 MG tablet Commonly known as:  FOLVITE Take 1 tablet by mouth daily.   meloxicam 7.5 MG tablet Commonly known as:  MOBIC Take 2 tablets by mouth daily.   metFORMIN 500 MG tablet Commonly known as:  GLUCOPHAGE Take 1 tablet (500 mg total) by mouth 2 (two) times daily with a meal.   methotrexate 2.5 MG tablet Commonly known as:  RHEUMATREX Take 4 tablets by mouth once a week.     multivitamin capsule Take 1 capsule by mouth daily. Takes womens for extra calcium   omeprazole 20 MG capsule Commonly known as:  PRILOSEC TAKE 1 CAPSULE BY MOUTH  DAILY   rosuvastatin 5 MG tablet Commonly known as:  CRESTOR 3 times week   SIMPONI 50 MG/0.5ML Sosy Generic drug:  Golimumab Inject  50 Units into the skin every 30 (thirty) days.       All past medical history, surgical history, allergies, family history, immunizations andmedications were updated in the EMR today and reviewed under the history and medication portions of their EMR.    Recent Results (from the past 2160 hour(s))  POCT HgB A1C     Status: Abnormal   Collection Time: 08/29/17  8:21 AM  Result Value Ref Range   Hemoglobin A1C 6.4     Patient was never admitted.   ROS: 14 pt review of systems performed and negative (unless mentioned in an HPI)  Objective: BP 112/75 (BP Location: Left Arm, Patient Position: Sitting, Cuff Size: Normal)   Pulse 63   Temp (!) 97.5 F (36.4 C) (Oral)   Ht 6' (1.829 m)   Wt 197 lb 1.9 oz (89.4 kg)   SpO2 93%   BMI 26.73 kg/m  Gen: Afebrile. No acute distress. Nontoxic in appearance, well-developed, well-nourished, Caucasian male HENT: AT. [place].  MMM.  Eyes:Pupils Equal Round Reactive to light, Extraocular movements intact,  Conjunctiva without redness, discharge or icterus. CV: RRR no murmur, no edema Chest: CTAB, no wheeze or crackles Abd: Soft. NTND. BS present.  Skin: no rashes, purpura or petechiae.  Neuro:  Normal gait. PERLA. EOMi. Alert. Oriented x3  Psych: Normal affect, dress and demeanor. Normal speech. Normal thought content and judgment..  Diabetic Foot Exam - Simple   Simple Foot Form Diabetic Foot exam was performed with the following findings:  Yes 08/29/2017 10:26 AM  Visual Inspection No deformities, no ulcerations, no other skin breakdown bilaterally:  Yes Sensation Testing See comments:  Yes Pulse Check Posterior Tibialis and Dorsalis pulse  intact bilaterally:  Yes Comments Mild neuropathy bilateral medial large toe.      Assessment/plan: Bruce Kelly is a 57 y.o. male present for  type 2 diabetes - Stable. A1c 5.8--> 6.4 today. Continue with metformin 500 mg twice a day. - Doing great with diet and exercise has lost almost 30 pounds. Needs to refocus, and start watching diet closely again. - Continue checking fasting glucose couple times a week. - Flu shot completed 2018 - Prevnar completed today 08/29/2017 - Microalbumin completed today 08/29/2017 - Foot exam completed today 08/29/2017 - Follow-up 3-4 months  Heart racing /Hyperlipidemia LDL goal <130/statin use. - Heart racing symptoms are new to this provider. Seems to be recurrence from approximately 2 years ago with a normal cardio vascular workup ([facility]?). Currently happening 1-2 times a month without any associated symptoms. Patient is to monitor closely, make a diary of these events and surrounding circumstances which we discussed today. If they are worsening, increasing in frequency were associated with other acute symptoms he is to be seen immediately, otherwise will follow-up in 3 months at his next appointment. - stable HLD  - Continue fish oil supplementation. - Continue: rosuvastatin (CRESTOR) 5 MG tablet; 3 times week    Note is dictated utilizing voice recognition software. Although note has been proof read prior to signing, occasional typographical errors still can be missed. If any questions arise, please do not hesitate to call for verification.    Electronically signed by: Howard Pouch, DO [place]

## 2017-09-20 DIAGNOSIS — M150 Primary generalized (osteo)arthritis: Secondary | ICD-10-CM | POA: Diagnosis not present

## 2017-09-20 DIAGNOSIS — I499 Cardiac arrhythmia, unspecified: Secondary | ICD-10-CM | POA: Diagnosis not present

## 2017-09-20 DIAGNOSIS — M0579 Rheumatoid arthritis with rheumatoid factor of multiple sites without organ or systems involvement: Secondary | ICD-10-CM | POA: Diagnosis not present

## 2017-09-20 DIAGNOSIS — R002 Palpitations: Secondary | ICD-10-CM | POA: Diagnosis not present

## 2017-09-20 DIAGNOSIS — Z79899 Other long term (current) drug therapy: Secondary | ICD-10-CM | POA: Diagnosis not present

## 2017-09-23 ENCOUNTER — Ambulatory Visit (INDEPENDENT_AMBULATORY_CARE_PROVIDER_SITE_OTHER): Payer: 59 | Admitting: Family Medicine

## 2017-09-23 ENCOUNTER — Encounter: Payer: Self-pay | Admitting: Family Medicine

## 2017-09-23 VITALS — BP 110/74 | HR 63 | Temp 97.3°F | Ht 72.0 in | Wt 204.2 lb

## 2017-09-23 DIAGNOSIS — E785 Hyperlipidemia, unspecified: Secondary | ICD-10-CM | POA: Insufficient documentation

## 2017-09-23 DIAGNOSIS — Z79899 Other long term (current) drug therapy: Secondary | ICD-10-CM | POA: Insufficient documentation

## 2017-09-23 DIAGNOSIS — K635 Polyp of colon: Secondary | ICD-10-CM | POA: Insufficient documentation

## 2017-09-23 DIAGNOSIS — M069 Rheumatoid arthritis, unspecified: Secondary | ICD-10-CM

## 2017-09-23 DIAGNOSIS — G629 Polyneuropathy, unspecified: Secondary | ICD-10-CM

## 2017-09-23 DIAGNOSIS — N529 Male erectile dysfunction, unspecified: Secondary | ICD-10-CM

## 2017-09-23 DIAGNOSIS — I499 Cardiac arrhythmia, unspecified: Secondary | ICD-10-CM

## 2017-09-23 DIAGNOSIS — R7989 Other specified abnormal findings of blood chemistry: Secondary | ICD-10-CM | POA: Insufficient documentation

## 2017-09-23 HISTORY — DX: Hyperlipidemia, unspecified: E78.5

## 2017-09-23 HISTORY — DX: Cardiac arrhythmia, unspecified: I49.9

## 2017-09-23 HISTORY — DX: Other long term (current) drug therapy: Z79.899

## 2017-09-23 HISTORY — DX: Polyneuropathy, unspecified: G62.9

## 2017-09-23 HISTORY — DX: Rheumatoid arthritis, unspecified: M06.9

## 2017-09-23 HISTORY — DX: Male erectile dysfunction, unspecified: N52.9

## 2017-09-23 NOTE — Progress Notes (Signed)
Bruce Kelly , 07-27-1960, 57 y.o., male MRN: [id number] Patient Care Team    Relationship Specialty Notifications Start End  [name], DO PCP - General Family Medicine  11/21/16   [facility]  11/22/16     Chief Complaint  Patient presents with  . Follow-up    UC Visit     Subjective: Pt presents for an OV with complaints of intermittent racing heart/palpitations of 2-3 months duration.  Associated symptoms include very brief chest pressure and feelings of flutter in his chest when it occurs.  He feels this is worsening over the last month.  He was at his rheumatologist office last week when he felt the sensation, and it was confirmed by exam his heart rate was irregular.  He was sent to emergency room, he went to the urgent care and by that time he was in normal sinus rhythm.  He reports right when he was walking out of his rheumatology office his heart rate normalized.  He denies any increase in caffeine use or any caffeine use that morning.  He states the morning prior to his appointment was normal, he was not rushed.  He had breakfast and slept well the night before.  Had been seen approximately 2 years ago for similar condition and was told his studies were all normal including a stress test.  He denies dizziness, syncope or shortness of breath.  He takes a daily baby aspirin.  Depression screen [facility] 2/9 04/15/2017 12/20/2016 11/21/2016  Decreased Interest 0 0 0  Down, Depressed, Hopeless 0 0 0  PHQ - 2 Score 0 0 0    Allergies  Allergen Reactions  . Penicillins     Doesn't know  . Sulfa Antibiotics     Doesn't know   Social History   Tobacco Use  . Smoking status: Former Smoker    Packs/day: 1.50    Years: 10.00    Pack years: 15.00    Types: Cigarettes    Last attempt to quit: 1999    Years since quitting: 20.2  . Smokeless tobacco: Never Used  Substance Use Topics  . Alcohol use: Yes    Alcohol/week: 2.4 oz    Types: 2  Cans of beer, 2 Shots of liquor per week   Past Medical History:  Diagnosis Date  . Closed right ankle fracture   . Colon polyp 2011   x3 polyps, pts reports 10 year follow up  . 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 Elevated ferritin 2013   479 -->  return to normal  . Erectile dysfunction 2013   Had been on Cialis  . GERD (gastroesophageal reflux disease) 2013  . Hyperlipidemia LDL goal <130   . Osteopenia 2013   prior h/o of Chronic steroids for 10 years; was on Boniva for 6 months. Now takes calcium/vitamin D daily.  . Peripheral neuropathy 2009   Did not respond to gabapentin by prior records  . Prediabetes   . Rheumatoid arthritis ([place]) 1998   had been on chronic steroids for about 10 years  . Rheumatoid lung ([place])    pt reports having thoracentesis for rheumatoid lung many years ago.   . Thrombocytopenia ([place]) 2013   Mildly low at 130  . Wears glasses    [facility]   Past Surgical History:  Procedure Laterality Date  . NO PAST SURGERIES     Family History  Problem Relation Age of Onset  . Arthritis Mother   . Diabetes Father   . Heart disease Father   . Arthritis Sister   . Diabetes Sister   . Alcohol abuse Brother   . Drug abuse Brother   . Breast cancer Paternal Aunt   . Early death Maternal Grandfather    Allergies as of 09/23/2017      Reactions   Penicillins    Doesn't know   Sulfa Antibiotics    Doesn't know      Medication List        Accurate as of 09/23/17 12:35 PM. Always use your most recent med list.          aspirin EC 81 MG tablet Take 81 mg by mouth daily.   b complex vitamins capsule Take 1 capsule by mouth daily.   blood glucose meter kit and supplies Dispense based on patient and insurance  Check blood sugar once daily.   calcium-vitamin D 500-200 MG-UNIT tablet Commonly known as:  OSCAL WITH D Take 1 tablet by mouth.   folic acid 1 MG tablet Commonly known as:   FOLVITE Take 1 tablet by mouth daily.   meloxicam 7.5 MG tablet Commonly known as:  MOBIC Take 2 tablets by mouth daily.   metFORMIN 500 MG tablet Commonly known as:  GLUCOPHAGE Take 1 tablet (500 mg total) by mouth 2 (two) times daily with a meal.   methotrexate 2.5 MG tablet Commonly known as:  RHEUMATREX Take 4 tablets by mouth once a week.   multivitamin capsule Take 1 capsule by mouth daily. Takes womens for extra calcium   omeprazole 20 MG capsule Commonly known as:  PRILOSEC TAKE 1 CAPSULE BY MOUTH  DAILY   rosuvastatin 5 MG tablet Commonly known as:  CRESTOR 3 times week   SIMPONI 50 MG/0.5ML Sosy Generic drug:  Golimumab Inject 50 Units into the skin every 30 (thirty) days.       All past medical history, surgical history, allergies, family history, immunizations andmedications were updated in the EMR today and reviewed under the history and medication portions of their EMR.     ROS: Negative, with the exception of above mentioned in HPI   Objective:  BP 110/74 (BP Location: Left Arm, Patient Position: Sitting, Cuff Size: Normal)   Pulse 63   Temp (!) 97.3 F (36.3 C) (Oral)   Ht 6' (1.829 m)   Wt 204 lb 3.2 oz (92.6 kg)   SpO2 95%   BMI 27.69 kg/m  Body mass index is 27.69 kg/m. Gen: Afebrile. No acute distress. Nontoxic in appearance, well developed, well nourished.  HENT: AT. [place].  MMM Eyes:Pupils Equal Round Reactive to light, Extraocular movements intact,  Conjunctiva without redness, discharge or icterus. Neck/lymp/endocrine: Supple, no lymphadenopathy CV: RRR no murmur, no edema Chest: CTAB, no wheeze or crackles. Good air movement, normal resp effort.  Neuro:  Normal gait. PERLA. EOMi. Alert. Oriented x3  Psych: Normal affect, dress and demeanor. Normal speech. Normal thought content and judgment.  No exam data present No results found. No results found for this or any previous visit (from the past 24 hour(s)).  Assessment/Plan: [name] is a 57 y.o. male present for OV for  Irregular heart beat -Irregular heartbeat possibly paroxysmal A. fib.  Unable to capture on EKG last week when sent to urgent care from rheumatology.  Occurring more frequently.  Patient to continue monitoring, writing down events and length of time occurs plus any  possible triggers or symptoms during those times. - Ambulatory referral to Cardiology for further evaluation, will likely need an event or Holter monitor.   Reviewed expectations re: course of current medical issues.  Discussed self-management of symptoms.  Outlined signs and symptoms indicating need for more acute intervention.  Patient verbalized understanding and all questions were answered.  Patient received an After-Visit Summary.    Orders Placed This Encounter  Procedures  . Ambulatory referral to Cardiology     Note is dictated utilizing voice recognition software. Although note has been proof read prior to signing, occasional typographical errors still can be missed. If any questions arise, please do not hesitate to call for verification.   electronically signed by:  Howard Pouch, DO  [place]

## 2017-09-23 NOTE — Patient Instructions (Signed)
I have referred you to cardiology to try to capture events and make recommendation.  Continue aspirin daily.    Atrial Fibrillation Atrial fibrillation is a type of heartbeat that is irregular or fast (rapid). If you have this condition, your heart keeps quivering in a weird (chaotic) way. This condition can make it so your heart cannot pump blood normally. Having this condition gives a person more risk for stroke, heart failure, and other heart problems. There are different types of atrial fibrillation. Talk with your doctor to learn about the type that you have. Follow these instructions at home:  Take over-the-counter and prescription medicines only as told by your doctor.  If your doctor prescribed a blood-thinning medicine, take it exactly as told. Taking too much of it can cause bleeding. If you do not take enough of it, you will not have the protection that you need against stroke and other problems.  Do not use any tobacco products. These include cigarettes, chewing tobacco, and e-cigarettes. If you need help quitting, ask your doctor.  If you have apnea (obstructive sleep apnea), manage it as told by your doctor.  Do not drink alcohol.  Do not drink beverages that have caffeine. These include coffee, soda, and tea.  Maintain a healthy weight. Do not use diet pills unless your doctor says they are safe for you. Diet pills may make heart problems worse.  Follow diet instructions as told by your doctor.  Exercise regularly as told by your doctor.  Keep all follow-up visits as told by your doctor. This is important. Contact a doctor if:  You notice a change in the speed, rhythm, or strength of your heartbeat.  You are taking a blood-thinning medicine and you notice more bruising.  You get tired more easily when you move or exercise. Get help right away if:  You have pain in your chest or your belly (abdomen).  You have sweating or weakness.  You feel sick to your stomach  (nauseous).  You notice blood in your throw up (vomit), poop (stool), or pee (urine).  You are short of breath.  You suddenly have swollen feet and ankles.  You feel dizzy.  Your suddenly get weak or numb in your face, arms, or legs, especially if it happens on one side of your body.  You have trouble talking, trouble understanding, or both.  Your face or your eyelid droops on one side. These symptoms may be an emergency. Do not wait to see if the symptoms will go away. Get medical help right away. Call your local emergency services (911 in the [place] Do not drive yourself to the hospital. This information is not intended to replace advice given to you by your health care provider. Make sure you discuss any questions you have with your health care provider. Document Released: 04/03/2008 Document Revised: 12/01/2015 Document Reviewed: 10/20/2014 Elsevier Interactive Patient Education  [employer].

## 2017-09-25 DIAGNOSIS — R002 Palpitations: Secondary | ICD-10-CM | POA: Insufficient documentation

## 2017-09-25 NOTE — Progress Notes (Signed)
Cardiology Office Note:    Date:  09/26/2017   ID:  Bruce Bruce Kelly, DOB 1960-12-02, MRN [id number]  PCP:  [name], DO  Cardiologist:  Shirlee More, MD   Referring MD: [name], DO  ASSESSMENT:    1. Palpitation   2. High risk medication use   3. Type 2 diabetes mellitus without complication, without long-term current use of insulin ([place])   4. Hyperlipidemia LDL goal <130    PLAN:    In order of problems listed above:  1. His symptoms are significant persistent suggestive of and he is at risk of atrial fibrillation.  Unfortunately they are infrequent not captured we explored the options of event monitor versus iPhone adapter to capture EKG he prefers the latter will utilize it for 3 months and if he captures atrial fibrillation will contact me.  For completeness I will check his electrolytes magnesium I asked him to avoid over-the-counter medications that are proarrhythmic. 2. His biologic agent puts him at risk for adverse cardiovascular events of heart failure and asked him to have echocardiogram performed.  I do not think that he has heart failure cardiomyopathy or needs to interrupt his treatment providers his structure and function are normal. 3. Stable well managed with diet weight loss and metformin 4. Stable continue with statin  Next appointment 3 months   Medication Adjustments/Labs and Tests Ordered: Current medicines are reviewed at length with the patient today.  Concerns regarding medicines are outlined above.  Orders Placed This Encounter  Procedures  . Basic metabolic panel  . Magnesium  . ECHOCARDIOGRAM COMPLETE   No orders of the defined types were placed in this encounter.    Chief Complaint  Patient presents with  . Irregular Heart Beat    History of Present Illness:    Bruce Bruce Kelly is a 57 y.o. male with Bruce Kelly, T2 DM, and hyperlipidemiawho is being seen today for the evaluation of palpitation at the request of Kuneff, [place], DO.  He has  a long history Of palpitations dating back years the episodes occur every few months and last from hours and at times most of the day he describes the pulse is irregular fasting chaotic and unfortunately is never been captured.  He recently had an episode but by the time the EKG was done it had resolved.  That EKG was normal daily including QT interval.  He takes no over-the-counter proarrhythmic agents and was seen by a cardiologist several years ago not of the epic system who had a treadmill stress test done in his office that was normal.  He is at risk for atrial fibrillation with his diabetes and age and her risk for structural heart disease with biologic agent for rheumatoid arthritis he will utilize an iPhone adapter to capture symptomatic episodes and will undergo echocardiogram to exclude structural heart disease.  I do not think he needs interrupt his treatment for rheumatoid arthritis I will repeat an ischemia evaluation at this time.  Simponi adverse effects: HEART FAILURE  Heart failure can occur or get worse in people who use TNF blockers, including SIMPONI. If you develop new or worsening heart failure with SIMPONI, you may need treatment in a hospital, and it may result in death. Your doctor will closely monitor you if you have heart failure. Tell your doctor right away if you get new or worsening symptoms of heart failure like shortness of breath, swelling of your lower legs or feet, or sudden weight gain.   Past  Medical History:  Diagnosis Date  . Closed right ankle fracture   . Colon polyp 2011   x3 polyps, pts reports 10 year follow up  . 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 ED (erectile dysfunction) 09/23/2017  . Elevated ferritin 2013   479 -->  return to normal  . Encounter for long-term (current) use of other medications 09/23/2017  . Erectile dysfunction 2013   Had been on Cialis  . GERD (gastroesophageal reflux disease) 2013  .  Hyperlipidemia LDL goal <130   . Irregular heart rate 09/23/2017  . Neuropathy 09/23/2017  . On statin therapy 04/15/2017  . Osteopenia 2013   prior h/o of Chronic steroids for 10 years; was on Boniva for 6 months. Now takes calcium/vitamin D daily.  . Other and unspecified hyperlipidemia 09/23/2017  . Overweight (BMI 25.0-29.9) 11/22/2016  . Peripheral neuropathy 2009   Did not respond to gabapentin by prior records  . Prediabetes   . Bruce Kelly (rheumatoid arthritis) ([place]) 09/23/2017  . Rheumatoid arthritis ([place]) 1998   had been on chronic steroids for about 10 years  . Rheumatoid lung ([place])    pt reports having thoracentesis for rheumatoid lung many years ago.   . Thrombocytopenia ([place]) 2013   Mildly low at 130  . Type 2 diabetes mellitus without complication, without long-term current use of insulin (Bruce Kelly) 11/22/2016  . Vitamin D deficiency 11/22/2016  . Wears glasses    [facility]    Past Surgical History:  Procedure Laterality Date  . NO PAST SURGERIES      Current Medications: Current Meds  Medication Sig  . aspirin EC 81 MG tablet Take 81 mg by mouth daily.  Marland Kitchen b complex vitamins capsule Take 1 capsule by mouth daily.  . blood glucose meter kit and supplies Dispense based on patient and insurance  Check blood sugar once daily.  . calcium-vitamin D (OSCAL WITH D) 500-200 MG-UNIT tablet Take 1 tablet by mouth.  . folic acid (FOLVITE) 1 MG tablet Take 1 tablet by mouth daily.  . Golimumab (SIMPONI) 50 MG/0.5ML SOSY Inject 50 Units into the skin every 30 (thirty) days.  . meloxicam (MOBIC) 7.5 MG tablet Take 2 tablets by mouth daily.  . metFORMIN (GLUCOPHAGE) 500 MG tablet Take 1 tablet (500 mg total) by mouth 2 (two) times daily with a meal.  . methotrexate (RHEUMATREX) 2.5 MG tablet Take 4 tablets by mouth once a week.  . Multiple Vitamin (MULTIVITAMIN) capsule Take 1 capsule by mouth daily. Takes womens for extra calcium  . omeprazole (PRILOSEC) 20 MG capsule TAKE 1 CAPSULE BY  MOUTH  DAILY  . rosuvastatin (CRESTOR) 5 MG tablet 3 times week     Allergies:   Penicillins and Sulfa antibiotics   Social History   Socioeconomic History  . Marital status: Married    Spouse name: Mickel Baas  . Number of children: Not on file  . Years of education: 31  . Highest education level: Not on file  Occupational History  . Not on file  Social Needs  . Financial resource strain: Not on file  . Food insecurity:    Worry: Not on file    Inability: Not on file  . Transportation needs:    Medical: Not on file    Non-medical: Not on file  Tobacco Use  . Smoking status: Former Smoker    Packs/day: 1.50    Years: 10.00    Pack years: 15.00  Types: Cigarettes    Last attempt to quit: 1999    Years since quitting: 20.2  . Smokeless tobacco: Never Used  Substance and Sexual Activity  . Alcohol use: Yes    Alcohol/week: 2.4 oz    Types: 2 Cans of beer, 2 Shots of liquor per week  . Drug use: No  . Sexual activity: Yes    Partners: Female    Birth control/protection: Pill  Lifestyle  . Physical activity:    Days per week: Not on file    Minutes per session: Not on file  . Stress: Not on file  Relationships  . Social connections:    Talks on phone: Not on file    Gets together: Not on file    Attends religious service: Not on file    Active member of club or organization: Not on file    Attends meetings of clubs or organizations: Not on file    Relationship status: Not on file  Other Topics Concern  . Not on file  Social History Narrative   Drinks caffeine, takes a daily vitamin.   Masters degree, Materials engineer.   Exercises routinely.   Wears his seatbelt, firearms locked in the home.   Feels  safe in his relationships.     Family History: The patient's family history includes Alcohol abuse in his brother; Arthritis in his mother and sister; Breast cancer in his paternal aunt; Diabetes in his father and sister; Drug abuse in his brother; Early death in his  maternal grandfather; Heart disease in his father.  ROS:   Review of Systems  Constitution: Negative.  HENT: Negative.   Eyes: Negative.   Cardiovascular: Positive for irregular heartbeat and palpitations. Negative for chest pain, claudication, dyspnea on exertion, leg swelling, near-syncope, orthopnea, paroxysmal nocturnal dyspnea and syncope.  Respiratory: Negative.   Endocrine: Negative.   Hematologic/Lymphatic: Negative.   Skin: Negative.   Musculoskeletal: Positive for joint pain.  Gastrointestinal: Negative.   Genitourinary: Negative.   Neurological: Negative.   Psychiatric/Behavioral: Negative.   Allergic/Immunologic: Negative.    Please see the history of present illness.     All other systems reviewed and are negative.  EKGs/Labs/Other Studies Reviewed:    The following studies were reviewed today:   EKG:  EKG 09/20/17 [place] normal EKG QTc 383 m sce  Recent Labs: 04/15/2017: ALT 22  Recent Lipid Panel    Component Value Date/Time   CHOL 179 04/15/2017 1336   TRIG 163.0 (H) 04/15/2017 1336   HDL 46.10 04/15/2017 1336   CHOLHDL 4 04/15/2017 1336   VLDL 32.6 04/15/2017 1336   LDLCALC 100 (H) 04/15/2017 1336    Physical Exam:    VS:  BP 104/80 (BP Location: Left Arm, Patient Position: Sitting, Cuff Size: Normal)   Pulse (!) 56   Ht 6' (1.829 m)   Wt 203 lb (92.1 kg)   SpO2 97%   BMI 27.53 kg/m     Wt Readings from Last 3 Encounters:  09/26/17 203 lb (92.1 kg)  09/23/17 204 lb 3.2 oz (92.6 kg)  08/29/17 197 lb 1.9 oz (89.4 kg)     GEN:  Well nourished, well developed in no acute distress HEENT: Normal NECK: No JVD; No carotid bruits LYMPHATICS: No lymphadenopathy CARDIAC: RRR, no murmurs, rubs, gallops RESPIRATORY:  Clear to auscultation without rales, wheezing or rhonchi  ABDOMEN: Soft, non-tender, non-distended MUSCULOSKELETAL:  No edema; No deformity  SKIN: Warm and dry NEUROLOGIC:  Alert and oriented x 3 PSYCHIATRIC:  Normal affect      Signed, Shirlee More, MD  09/26/2017 11:28 AM    [facility]

## 2017-09-26 ENCOUNTER — Ambulatory Visit (INDEPENDENT_AMBULATORY_CARE_PROVIDER_SITE_OTHER): Payer: 59 | Admitting: Cardiology

## 2017-09-26 ENCOUNTER — Encounter: Payer: Self-pay | Admitting: Cardiology

## 2017-09-26 VITALS — BP 104/80 | HR 56 | Ht 72.0 in | Wt 203.0 lb

## 2017-09-26 DIAGNOSIS — R002 Palpitations: Secondary | ICD-10-CM | POA: Diagnosis not present

## 2017-09-26 DIAGNOSIS — Z79899 Other long term (current) drug therapy: Secondary | ICD-10-CM | POA: Diagnosis not present

## 2017-09-26 DIAGNOSIS — E119 Type 2 diabetes mellitus without complications: Secondary | ICD-10-CM | POA: Diagnosis not present

## 2017-09-26 DIAGNOSIS — E785 Hyperlipidemia, unspecified: Secondary | ICD-10-CM

## 2017-09-26 NOTE — Patient Instructions (Addendum)
Medication Instructions:  Your physician recommends that you continue on your current medications as directed. Please refer to the Current Medication list given to you today.   Labwork: Your physician recommends that you return for lab work TODAY BMET and Mag   Testing/Procedures: Your physician has requested that you have an echocardiogram. Echocardiography is a painless test that uses sound waves to create images of your heart. It provides your doctor with information about the size and shape of your heart and how well your heart's chambers and valves are working. This procedure takes approximately one hour. There are no restrictions for this procedure.    Follow-Up: Your physician wants you to follow-up in:3 months. You will receive a reminder letter in the mail two months in advance. If you don't receive a letter, please call our office to schedule the follow-up appointment.   Any Other Special Instructions Will Be Listed Below (If Applicable).     If you need a refill on your cardiac medications before your next appointment, please call your pharmacy.    1. Avoid all over-the-counter antihistamines except Claritin/Loratadine and Zyrtec/Cetrizine. 2. Avoid all combination including cold sinus allergies flu decongestant and sleep medications 3. You can use Robitussin DM Mucinex and Mucinex DM for cough. 4. can use Tylenol aspirin ibuprofen and naproxen but no combinations such as sleep or sinus.   KardiaMobile [email or web address]        FDA-cleared, clinical grade mobile EKG monitor: [name] is the most clinically-validated mobile EKG used by the world's leading cardiac care medical professionals With Basic service, know instantly if your heart rhythm is normal or if atrial fibrillation is detected, and email the last single EKG recording to yourself or your doctor Premium service, available for purchase through the Kardia app for $9.99 per month  or $99 per year, includes unlimited history and storage of your EKG recordings, a monthly EKG summary report to share with your doctor, along with the ability to track your blood pressure, activity and weight Includes one KardiaMobile phone clip FREE SHIPPING: Standard delivery 1-3 business days. Orders placed by 11:00am PST will ship that afternoon. Otherwise, will ship next business day. All orders ship via [employer] from [place], [place]

## 2017-09-26 NOTE — Addendum Note (Signed)
Addended by: Jerl Santos R on: 09/26/2017 04:56 PM   Modules accepted: Orders

## 2017-09-27 ENCOUNTER — Telehealth: Payer: Self-pay | Admitting: Cardiology

## 2017-09-27 LAB — BASIC METABOLIC PANEL
BUN/Creatinine Ratio: 18 (ref 9–20)
BUN: 17 mg/dL (ref 6–24)
CO2: 24 mmol/L (ref 20–29)
CREATININE: 0.94 mg/dL (ref 0.76–1.27)
Calcium: 9.4 mg/dL (ref 8.7–10.2)
Chloride: 101 mmol/L (ref 96–106)
GFR calc Af Amer: 103 mL/min/{1.73_m2} (ref >59)
GFR calc non Af Amer: 89 mL/min/{1.73_m2} (ref >59)
GLUCOSE: 140 mg/dL — AB (ref 65–99)
Potassium: 4.5 mmol/L (ref 3.5–5.2)
SODIUM: 140 mmol/L (ref 134–144)

## 2017-09-27 LAB — MAGNESIUM: Magnesium: 2 mg/dL (ref 1.6–2.3)

## 2017-09-27 NOTE — Telephone Encounter (Signed)
Left message to return call 

## 2017-09-27 NOTE — Telephone Encounter (Signed)
Advised patient we do not have a 12 digit code for the company to fax the EKG to our office. Advised patient he can have the company interpret it or not and then fax that result to the office. Patient verbalized understanding. No further questions.

## 2017-09-27 NOTE — Telephone Encounter (Signed)
Needs a 12 digit code for [employer]

## 2017-10-04 ENCOUNTER — Other Ambulatory Visit (HOSPITAL_BASED_OUTPATIENT_CLINIC_OR_DEPARTMENT_OTHER): Payer: 59

## 2017-10-25 ENCOUNTER — Ambulatory Visit (HOSPITAL_BASED_OUTPATIENT_CLINIC_OR_DEPARTMENT_OTHER)
Admission: RE | Admit: 2017-10-25 | Discharge: 2017-10-25 | Disposition: A | Discharge status: Home / Self Care | Payer: 59 | Source: Ambulatory Visit | Attending: Cardiology | Admitting: Cardiology

## 2017-10-25 DIAGNOSIS — R002 Palpitations: Secondary | ICD-10-CM | POA: Insufficient documentation

## 2017-10-25 DIAGNOSIS — I340 Nonrheumatic mitral (valve) insufficiency: Secondary | ICD-10-CM | POA: Insufficient documentation

## 2017-10-25 DIAGNOSIS — E785 Hyperlipidemia, unspecified: Secondary | ICD-10-CM | POA: Diagnosis not present

## 2017-10-25 DIAGNOSIS — E119 Type 2 diabetes mellitus without complications: Secondary | ICD-10-CM | POA: Diagnosis not present

## 2017-10-25 NOTE — Progress Notes (Signed)
Echocardiogram 2D Echocardiogram has been performed.  Bruce Kelly 10/25/2017, 9:05 AM

## 2017-11-06 ENCOUNTER — Telehealth: Payer: Self-pay

## 2017-11-06 NOTE — Telephone Encounter (Signed)
-----   Message from [name], MD sent at 11/06/2017 12:25 PM EDT ----- 50 mg BID needs an EKG 4-5 days after start ----- Message ----- From: Jossie Ng, RN Sent: 11/06/2017  11:34 AM To: [name], MD  What dose of flecainide. We never started pending the echo results.  ----- Message ----- From: [name], MD Sent: 09/30/2017   7:23 PM To: Jossie Ng, RN  His Kardia shows PAF, I would like to start on Flecanide if echo is good, when is it scheduled?

## 2017-11-06 NOTE — Telephone Encounter (Signed)
Left message to return call for echo results and starting flecainide 50 mg twice daily and EKG need for 4-5 days.

## 2017-11-07 MED ORDER — FLECAINIDE ACETATE 50 MG PO TABS: 50.0000 mg | ORAL_TABLET | Freq: Two times a day (BID) | ORAL | 11 refills | Status: DC

## 2017-11-07 NOTE — Addendum Note (Signed)
Addended by: Warner Mccreedy E on: 11/07/2017 02:04 PM   Modules accepted: Orders

## 2017-11-07 NOTE — Telephone Encounter (Signed)
Patient advised of stable echocardiogram results per Dr. Bettina Gavia. Patient advised to start flecainide 50 mg twice daily and return for an EKG 4-5 days after starting. Due to patients travel schedule for work he will start the medication on 11/14/17 and come in for an EKG on 11/18/17 at 8:30 am. Patient verbalized understanding. No further questions.

## 2017-11-18 ENCOUNTER — Ambulatory Visit (INDEPENDENT_AMBULATORY_CARE_PROVIDER_SITE_OTHER): Payer: 59 | Admitting: Cardiology

## 2017-11-18 VITALS — HR 54

## 2017-11-18 DIAGNOSIS — I499 Cardiac arrhythmia, unspecified: Secondary | ICD-10-CM

## 2017-11-18 NOTE — Patient Instructions (Signed)
Medication Instructions:  Your physician recommends that you continue on your current medications as directed. Please refer to the Current Medication list given to you today.  Labwork: None  Testing/Procedures: None  Follow-Up: Your physician recommends that you schedule a follow-up appointment in: as directed previously  Any Other Special Instructions Will Be Listed Below (If Applicable).     If you need a refill on your cardiac medications before your next appointment, please call your pharmacy.   [facility]  Beautifull Cisar A, RN, BSN  

## 2017-11-18 NOTE — Progress Notes (Signed)
Patient presented today for EKG post initiation of flecainide 50 mg BID. Patient states that he is feeling well and tolerating the new medication. EKG was reviewed by Dr. Bettina Gavia. No changes to be made; follow up as previously directed.

## 2017-12-05 ENCOUNTER — Ambulatory Visit (INDEPENDENT_AMBULATORY_CARE_PROVIDER_SITE_OTHER): Payer: 59 | Admitting: Family Medicine

## 2017-12-05 ENCOUNTER — Encounter: Payer: Self-pay | Admitting: Family Medicine

## 2017-12-05 VITALS — BP 109/73 | HR 52 | Temp 97.5°F | Resp 20 | Ht 72.0 in | Wt 208.2 lb

## 2017-12-05 DIAGNOSIS — Z23 Encounter for immunization: Secondary | ICD-10-CM

## 2017-12-05 DIAGNOSIS — I482 Chronic atrial fibrillation: Secondary | ICD-10-CM

## 2017-12-05 DIAGNOSIS — K219 Gastro-esophageal reflux disease without esophagitis: Secondary | ICD-10-CM | POA: Diagnosis not present

## 2017-12-05 DIAGNOSIS — Z125 Encounter for screening for malignant neoplasm of prostate: Secondary | ICD-10-CM

## 2017-12-05 DIAGNOSIS — E119 Type 2 diabetes mellitus without complications: Secondary | ICD-10-CM

## 2017-12-05 DIAGNOSIS — Z1159 Encounter for screening for other viral diseases: Secondary | ICD-10-CM | POA: Diagnosis not present

## 2017-12-05 DIAGNOSIS — E559 Vitamin D deficiency, unspecified: Secondary | ICD-10-CM | POA: Diagnosis not present

## 2017-12-05 DIAGNOSIS — R002 Palpitations: Secondary | ICD-10-CM | POA: Diagnosis not present

## 2017-12-05 DIAGNOSIS — Z0000 Encounter for general adult medical examination without abnormal findings: Secondary | ICD-10-CM | POA: Diagnosis not present

## 2017-12-05 DIAGNOSIS — E663 Overweight: Secondary | ICD-10-CM | POA: Diagnosis not present

## 2017-12-05 DIAGNOSIS — E785 Hyperlipidemia, unspecified: Secondary | ICD-10-CM | POA: Diagnosis not present

## 2017-12-05 DIAGNOSIS — Z79899 Other long term (current) drug therapy: Secondary | ICD-10-CM

## 2017-12-05 DIAGNOSIS — Z114 Encounter for screening for human immunodeficiency virus [HIV]: Secondary | ICD-10-CM | POA: Diagnosis not present

## 2017-12-05 DIAGNOSIS — G609 Hereditary and idiopathic neuropathy, unspecified: Secondary | ICD-10-CM

## 2017-12-05 DIAGNOSIS — I4821 Permanent atrial fibrillation: Secondary | ICD-10-CM

## 2017-12-05 LAB — LIPID PANEL
Cholesterol: 173 mg/dL (ref 0–200)
HDL: 43.4 mg/dL (ref >39.00)
LDL Cholesterol: 103 mg/dL — ABNORMAL HIGH (ref 0–99)
NonHDL: 129.91
Total CHOL/HDL Ratio: 4
Triglycerides: 135 mg/dL (ref 0.0–149.0)
VLDL: 27 mg/dL (ref 0.0–40.0)

## 2017-12-05 LAB — CBC WITH DIFFERENTIAL/PLATELET
BASOS ABS: 0 10*3/uL (ref 0.0–0.1)
Basophils Relative: 0.9 % (ref 0.0–3.0)
Eosinophils Absolute: 0.2 10*3/uL (ref 0.0–0.7)
Eosinophils Relative: 3.4 % (ref 0.0–5.0)
HEMATOCRIT: 43.6 % (ref 39.0–52.0)
Hemoglobin: 15.2 g/dL (ref 13.0–17.0)
LYMPHS PCT: 28.5 % (ref 12.0–46.0)
Lymphs Abs: 1.5 10*3/uL (ref 0.7–4.0)
MCHC: 34.8 g/dL (ref 30.0–36.0)
MCV: 90.9 fl (ref 78.0–100.0)
MONOS PCT: 9.5 % (ref 3.0–12.0)
Monocytes Absolute: 0.5 10*3/uL (ref 0.1–1.0)
NEUTROS ABS: 3 10*3/uL (ref 1.4–7.7)
NEUTROS PCT: 57.7 % (ref 43.0–77.0)
PLATELETS: 141 10*3/uL — AB (ref 150.0–400.0)
RBC: 4.8 Mil/uL (ref 4.22–5.81)
RDW: 13.7 % (ref 11.5–15.5)
WBC: 5.2 10*3/uL (ref 4.0–10.5)

## 2017-12-05 LAB — COMPREHENSIVE METABOLIC PANEL
ALBUMIN: 4.1 g/dL (ref 3.5–5.2)
ALK PHOS: 66 U/L (ref 39–117)
ALT: 24 U/L (ref 0–53)
AST: 22 U/L (ref 0–37)
BILIRUBIN TOTAL: 0.6 mg/dL (ref 0.2–1.2)
BUN: 20 mg/dL (ref 6–23)
CO2: 28 mEq/L (ref 19–32)
Calcium: 9.3 mg/dL (ref 8.4–10.5)
Chloride: 104 mEq/L (ref 96–112)
Creatinine, Ser: 0.95 mg/dL (ref 0.40–1.50)
GFR: 86.42 mL/min (ref >60.00)
GLUCOSE: 142 mg/dL — AB (ref 70–99)
Potassium: 4.5 mEq/L (ref 3.5–5.1)
SODIUM: 139 meq/L (ref 135–145)
TOTAL PROTEIN: 7.2 g/dL (ref 6.0–8.3)

## 2017-12-05 LAB — PSA: PSA: 0.45 ng/mL (ref 0.10–4.00)

## 2017-12-05 LAB — VITAMIN D 25 HYDROXY (VIT D DEFICIENCY, FRACTURES): VITD: 26.17 ng/mL — ABNORMAL LOW (ref 30.00–100.00)

## 2017-12-05 LAB — HEMOGLOBIN A1C: Hgb A1c MFr Bld: 6.8 % — ABNORMAL HIGH (ref 4.6–6.5)

## 2017-12-05 LAB — TSH: TSH: 1.67 u[IU]/mL (ref 0.35–4.50)

## 2017-12-05 NOTE — Patient Instructions (Signed)
I will refill your medicine once we get your results back.  Check with rheumatology on the Shingrix vaccine.   Health Maintenance, Male A healthy lifestyle and preventive care is important for your health and wellness. Ask your health care provider about what schedule of regular examinations is right for you. What should I know about weight and diet? Eat a Healthy Diet  Eat plenty of vegetables, fruits, whole grains, low-fat dairy products, and lean protein.  Do not eat a lot of foods high in solid fats, added sugars, or salt.  Maintain a Healthy Weight Regular exercise can help you achieve or maintain a healthy weight. You should:  Do at least 150 minutes of exercise each week. The exercise should increase your heart rate and make you sweat (moderate-intensity exercise).  Do strength-training exercises at least twice a week.  Watch Your Levels of Cholesterol and Blood Lipids  Have your blood tested for lipids and cholesterol every 5 years starting at 59 years of age. If you are at high risk for heart disease, you should start having your blood tested when you are 59 years old. You may need to have your cholesterol levels checked more often if: ? Your lipid or cholesterol levels are high. ? You are older than 59 years of age. ? You are at high risk for heart disease.  What should I know about cancer screening? Many types of cancers can be detected early and may often be prevented. Lung Cancer  You should be screened every year for lung cancer if: ? You are a current smoker who has smoked for at least 30 years. ? You are a former smoker who has quit within the past 15 years.  Talk to your health care provider about your screening options, when you should start screening, and how often you should be screened.  Colorectal Cancer  Routine colorectal cancer screening usually begins at 59 years of age and should be repeated every 5-10 years until you are 59 years old. You may need to  be screened more often if early forms of precancerous polyps or small growths are found. Your health care provider may recommend screening at an earlier age if you have risk factors for colon cancer.  Your health care provider may recommend using home test kits to check for hidden blood in the stool.  A small camera at the end of a tube can be used to examine your colon (sigmoidoscopy or colonoscopy). This checks for the earliest forms of colorectal cancer.  Prostate and Testicular Cancer  Depending on your age and overall health, your health care provider may do certain tests to screen for prostate and testicular cancer.  Talk to your health care provider about any symptoms or concerns you have about testicular or prostate cancer.  Skin Cancer  Check your skin from head to toe regularly.  Tell your health care provider about any new moles or changes in moles, especially if: ? There is a change in a mole's size, shape, or color. ? You have a mole that is larger than a pencil eraser.  Always use sunscreen. Apply sunscreen liberally and repeat throughout the day.  Protect yourself by wearing long sleeves, pants, a wide-brimmed hat, and sunglasses when outside.  What should I know about heart disease, diabetes, and high blood pressure?  If you are 75-13 years of age, have your blood pressure checked every 3-5 years. If you are 80 years of age or older, have your blood pressure checked  every year. You should have your blood pressure measured twice-once when you are at a hospital or clinic, and once when you are not at a hospital or clinic. Record the average of the two measurements. To check your blood pressure when you are not at a hospital or clinic, you can use: ? An automated blood pressure machine at a pharmacy. ? A home blood pressure monitor.  Talk to your health care provider about your target blood pressure.  If you are between 36-72 years old, ask your health care provider if  you should take aspirin to prevent heart disease.  Have regular diabetes screenings by checking your fasting blood sugar level. ? If you are at a normal weight and have a low risk for diabetes, have this test once every three years after the age of 35. ? If you are overweight and have a high risk for diabetes, consider being tested at a younger age or more often.  A one-time screening for abdominal aortic aneurysm (AAA) by ultrasound is recommended for men aged 69-75 years who are current or former smokers. What should I know about preventing infection? Hepatitis B If you have a higher risk for hepatitis B, you should be screened for this virus. Talk with your health care provider to find out if you are at risk for hepatitis B infection. Hepatitis C Blood testing is recommended for:  Everyone born from [birth year] through 1967.  Anyone with known risk factors for hepatitis C.  Sexually Transmitted Diseases (STDs)  You should be screened each year for STDs including gonorrhea and chlamydia if: ? You are sexually active and are younger than 59 years of age. ? You are older than 59 years of age and your health care provider tells you that you are at risk for this type of infection. ? Your sexual activity has changed since you were last screened and you are at an increased risk for chlamydia or gonorrhea. Ask your health care provider if you are at risk.  Talk with your health care provider about whether you are at high risk of being infected with HIV. Your health care provider may recommend a prescription medicine to help prevent HIV infection.  What else can I do?  Schedule regular health, dental, and eye exams.  Stay current with your vaccines (immunizations).  Do not use any tobacco products, such as cigarettes, chewing tobacco, and e-cigarettes. If you need help quitting, ask your health care provider.  Limit alcohol intake to no more than 2 drinks per day. One drink equals 12 ounces of  beer, 5 ounces of wine, or 1 ounces of hard liquor.  Do not use street drugs.  Do not share needles.  Ask your health care provider for help if you need support or information about quitting drugs.  Tell your health care provider if you often feel depressed.  Tell your health care provider if you have ever been abused or do not feel safe at home. This information is not intended to replace advice given to you by your health care provider. Make sure you discuss any questions you have with your health care provider. Document Released: 12/22/2007 Document Revised: 02/22/2016 Document Reviewed: 03/29/2015 [employer]vier Interactive Patient Education  [employer].

## 2017-12-05 NOTE — Progress Notes (Signed)
Patient ID: Bruce Kelly, male  DOB: Dec 07, 1960, 57 y.o.   MRN: [id number] Patient Care Team    Relationship Specialty Notifications Start End  [name], DO PCP - General Family Medicine  11/21/16   Wilnette Kales [facility]  Optometry  11/22/16   Marvene Staff  Family Medicine  09/26/17   [name], MD Consulting Physician Cardiology  12/06/17     Chief Complaint  Patient presents with  . Annual Exam    Subjective:  Bruce Kelly is a 57 y.o. male present for CPE. All past medical history, surgical history, allergies, family history, immunizations, medications and social history were updated in the electronic medical record today. All recent labs, ED visits and hospitalizations within the last year were reviewed.  Atrial fibrillation: Started on flecainide May 2. Following with Dr. Bettina Gavia. Still having a.fib by monitor and symptoms.   Diabetes:   Patient reports he has been have to travel a lot for work and has had a poor diet and has missed many doses of metformin.  Diagnosed with diabetes May 2018, prediabetic for some time. He has attended the nutrition classes. Patient denies dizziness, hyperglycemic or hypoglycemic events. Patient denies numbness, tingling in the extremities or nonhealing wounds of feet.  PNA series: Prevnar 13 provided today 08/29/2017, Pneumovax will be offered in one year Flu shot: Flu shot completed 2018 (recommneded yearly) BMP: Within normal limits, completed at his rheumatologist. Foot exam: Completed today 08/29/2017 Eye exam: 03/05/2017, [facility]. A1c: 7.5 --> 5.8 --> 6.4 --> repeated with labs   Health maintenance:  Colonoscopy: last screen 2011, recommend follow up 10 years. Immunizations:  tdap updated today, influenza up-to-date 2018, Prevnar completed 08/29/2017, Pneumovax due February 2020, discussed Shingrix vaccination and he will check with rheumatology. Infectious disease screening: HIV and Hep C and  agreeable to testing today. PSA:  Lab Results  Component Value Date   PSA 0.45 12/05/2017   PSA 0.600 01/25/2016  , pt was counseled on prostate cancer screenings.  Assistive device: None Oxygen use: None* Patient has a Dental home. Hospitalizations/ED visits: Reviewed  Depression screen [facility] 2/9 12/05/2017 04/15/2017 12/20/2016 11/21/2016  Decreased Interest 0 0 0 0  Down, Depressed, Hopeless 0 0 0 0  PHQ - 2 Score 0 0 0 0   No flowsheet data found.   Current Exercise Habits: The patient does not participate in regular exercise at present Exercise limited by: None identified Fall Risk  12/05/2017 12/05/2017 12/20/2016 11/21/2016  Falls in the past year? No No No No      Immunization History  Administered Date(s) Administered  . Influenza,inj,Quad PF,6+ Mos 04/15/2017  . Influenza-Unspecified 07/28/2015  . Pneumococcal Conjugate-13 08/29/2017  . Tdap 12/05/2017     Past Medical History:  Diagnosis Date  . Closed right ankle fracture   . Colon polyp 2011   x3 polyps, pts reports 10 year follow up  . 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 ED (erectile dysfunction) 09/23/2017  . Elevated ferritin 2013   479 -->  return to normal  . Encounter for long-term (current) use of other medications 09/23/2017  . Erectile dysfunction 2013   Had been on Cialis  . GERD (gastroesophageal reflux disease) 2013  . Hyperlipidemia LDL goal <130   . Irregular heart rate 09/23/2017  . Neuropathy 09/23/2017  . On statin therapy 04/15/2017  . Osteopenia 2013   prior h/o of Chronic  steroids for 10 years; was on Boniva for 6 months. Now takes calcium/vitamin D daily.  . Other and unspecified hyperlipidemia 09/23/2017  . Overweight (BMI 25.0-29.9) 11/22/2016  . Peripheral neuropathy 2009   Did not respond to gabapentin by prior records  . Prediabetes   . RA (rheumatoid arthritis) ([place]) 09/23/2017  . Rheumatoid arthritis ([place]) 1998   had been on chronic  steroids for about 10 years  . Rheumatoid lung ([place])    pt reports having thoracentesis for rheumatoid lung many years ago.   . Thrombocytopenia ([place]) 2013   Mildly low at 130  . Type 2 diabetes mellitus without complication, without long-term current use of insulin ([place]) 11/22/2016  . Vitamin D deficiency 11/22/2016  . Wears glasses    [facility]   Allergies  Allergen Reactions  . Penicillins     Doesn't know  . Sulfa Antibiotics     Doesn't know   Past Surgical History:  Procedure Laterality Date  . NO PAST SURGERIES     Family History  Problem Relation Age of Onset  . Arthritis Mother   . Diabetes Father   . Heart disease Father   . Arthritis Sister   . Diabetes Sister   . Alcohol abuse Brother   . Drug abuse Brother   . Breast cancer Paternal Aunt   . Early death Maternal Grandfather    Social History   Socioeconomic History  . Marital status: Married    Spouse name: Mickel Baas  . Number of children: Not on file  . Years of education: 69  . Highest education level: Not on file  Occupational History  . Not on file  Social Needs  . Financial resource strain: Not on file  . Food insecurity:    Worry: Not on file    Inability: Not on file  . Transportation needs:    Medical: Not on file    Non-medical: Not on file  Tobacco Use  . Smoking status: Former Smoker    Packs/day: 1.50    Years: 10.00    Pack years: 15.00    Types: Cigarettes    Last attempt to quit: 1999    Years since quitting: 20.4  . Smokeless tobacco: Never Used  Substance and Sexual Activity  . Alcohol use: Yes    Alcohol/week: 2.4 oz    Types: 2 Cans of beer, 2 Shots of liquor per week  . Drug use: No  . Sexual activity: Yes    Partners: Female    Birth control/protection: Pill  Lifestyle  . Physical activity:    Days per week: Not on file    Minutes per session: Not on file  . Stress: Not on file  Relationships  . Social connections:    Talks on phone: Not on file    Gets  together: Not on file    Attends religious service: Not on file    Active member of club or organization: Not on file    Attends meetings of clubs or organizations: Not on file    Relationship status: Not on file  . Intimate partner violence:    Fear of current or ex partner: Not on file    Emotionally abused: Not on file    Physically abused: Not on file    Forced sexual activity: Not on file  Other Topics Concern  . Not on file  Social History Narrative   Drinks caffeine, takes a daily vitamin.   Masters degree, Materials engineer.  Exercises routinely.   Wears his seatbelt, firearms locked in the home.   Feels  safe in his relationships.   Allergies as of 12/05/2017      Reactions   Penicillins    Doesn't know   Sulfa Antibiotics    Doesn't know      Medication List        Accurate as of 12/05/17 11:59 PM. Always use your most recent med list.          aspirin EC 81 MG tablet Take 81 mg by mouth daily.   b complex vitamins capsule Take 1 capsule by mouth daily.   blood glucose meter kit and supplies Dispense based on patient and insurance  Check blood sugar once daily.   calcium-vitamin D 500-200 MG-UNIT tablet Commonly known as:  OSCAL WITH D Take 1 tablet by mouth.   flecainide 50 MG tablet Commonly known as:  TAMBOCOR Take 1 tablet (50 mg total) by mouth 2 (two) times daily.   folic acid 1 MG tablet Commonly known as:  FOLVITE Take 1 tablet by mouth daily.   meloxicam 7.5 MG tablet Commonly known as:  MOBIC Take 2 tablets by mouth daily.   metFORMIN 500 MG tablet Commonly known as:  GLUCOPHAGE Take 1 tablet (500 mg total) by mouth 2 (two) times daily with a meal.   methotrexate 2.5 MG tablet Commonly known as:  RHEUMATREX Take 4 tablets by mouth once a week.   multivitamin capsule Take 1 capsule by mouth daily. Takes womens for extra calcium   omeprazole 20 MG capsule Commonly known as:  PRILOSEC TAKE 1 CAPSULE BY MOUTH  DAILY   rosuvastatin 5  MG tablet Commonly known as:  CRESTOR 3 times week   SIMPONI 50 MG/0.5ML Sosy Generic drug:  Golimumab Inject 50 Units into the skin every 30 (thirty) days.      All past medical history, surgical history, allergies, family history, immunizations andmedications were updated in the EMR today and reviewed under the history and medication portions of their EMR.     Recent Results (from the past 2160 hour(s))  Basic metabolic panel     Status: Abnormal   Collection Time: 09/26/17  9:19 AM  Result Value Ref Range   Glucose 140 (H) 65 - 99 mg/dL   BUN 17 6 - 24 mg/dL   Creatinine, Ser 0.94 0.76 - 1.27 mg/dL   GFR calc non Af Amer 89 >59 mL/min/1.73   GFR calc Af Amer 103 >59 mL/min/1.73   BUN/Creatinine Ratio 18 9 - 20   Sodium 140 134 - 144 mmol/L   Potassium 4.5 3.5 - 5.2 mmol/L   Chloride 101 96 - 106 mmol/L   CO2 24 20 - 29 mmol/L   Calcium 9.4 8.7 - 10.2 mg/dL  Magnesium     Status: None   Collection Time: 09/26/17  9:19 AM  Result Value Ref Range   Magnesium 2.0 1.6 - 2.3 mg/dL  CBC w/Diff     Status: Abnormal   Collection Time: 12/05/17  9:10 AM  Result Value Ref Range   WBC 5.2 4.0 - 10.5 K/uL   RBC 4.80 4.22 - 5.81 Mil/uL   Hemoglobin 15.2 13.0 - 17.0 g/dL   HCT 43.6 39.0 - 52.0 %   MCV 90.9 78.0 - 100.0 fl   MCHC 34.8 30.0 - 36.0 g/dL   RDW 13.7 11.5 - 15.5 %   Platelets 141.0 (L) 150.0 - 400.0 K/uL   Neutrophils Relative % 57.7  43.0 - 77.0 %   Lymphocytes Relative 28.5 12.0 - 46.0 %   Monocytes Relative 9.5 3.0 - 12.0 %   Eosinophils Relative 3.4 0.0 - 5.0 %   Basophils Relative 0.9 0.0 - 3.0 %   Neutro Abs 3.0 1.4 - 7.7 K/uL   Lymphs Abs 1.5 0.7 - 4.0 K/uL   Monocytes Absolute 0.5 0.1 - 1.0 K/uL   Eosinophils Absolute 0.2 0.0 - 0.7 K/uL   Basophils Absolute 0.0 0.0 - 0.1 K/uL  Lipid panel     Status: Abnormal   Collection Time: 12/05/17  9:10 AM  Result Value Ref Range   Cholesterol 173 0 - 200 mg/dL    Comment: ATP III Classification       Desirable:  <  200 mg/dL               Borderline High:  200 - 239 mg/dL          High:  > = 240 mg/dL   Triglycerides 135.0 0.0 - 149.0 mg/dL    Comment: Normal:  <150 mg/dLBorderline High:  150 - 199 mg/dL   HDL 43.40 >39.00 mg/dL   VLDL 27.0 0.0 - 40.0 mg/dL   LDL Cholesterol 103 (H) 0 - 99 mg/dL   Total CHOL/HDL Ratio 4     Comment:                Men          Women1/2 Average Risk     3.4          3.3Average Risk          5.0          4.42X Average Risk          9.6          7.13X Average Risk          15.0          11.0                       NonHDL 129.91     Comment: NOTE:  Non-HDL goal should be 30 mg/dL higher than patient's LDL goal (i.e. LDL goal of < 70 mg/dL, would have non-HDL goal of < 100 mg/dL)  HgB A1c     Status: Abnormal   Collection Time: 12/05/17  9:10 AM  Result Value Ref Range   Hgb A1c MFr Bld 6.8 (H) 4.6 - 6.5 %    Comment: Glycemic Control Guidelines for People with Diabetes:Non Diabetic:  <6%Goal of Therapy: <7%Additional Action Suggested:  >8%   PSA     Status: None   Collection Time: 12/05/17  9:10 AM  Result Value Ref Range   PSA 0.45 0.10 - 4.00 ng/mL    Comment: Test performed using Access Hybritech PSA Assay, a parmagnetic partical, chemiluminecent immunoassay.  HIV antibody (with reflex)     Status: None   Collection Time: 12/05/17  9:10 AM  Result Value Ref Range   HIV 1&2 Ab, 4th Generation NON-REACTIVE NON-REACTI    Comment: HIV-1 antigen and HIV-1/HIV-2 antibodies were not detected. There is no laboratory evidence of HIV infection. Marland Kitchen PLEASE NOTE: This information has been disclosed to you from records whose confidentiality may be protected by state law.  If your state requires such protection, then the state law prohibits you from making any further disclosure of the information without the specific written consent of the person to whom it pertains, or  as otherwise permitted by law. A general authorization for the release of medical or other information is  NOT sufficient for this purpose. . For additional information please refer to http://education.questdiagn[facility].com/faq/FAQ106 (This link is being provided for informational/ educational purposes only.) . Marland Kitchen The performance of this assay has not been clinically validated in patients less than 83 years old. .   Hepatitis C Antibody     Status: None   Collection Time: 12/05/17  9:10 AM  Result Value Ref Range   Hepatitis C Ab NON-REACTIVE NON-REACTI   SIGNAL TO CUT-OFF 0.02 <1.00    Comment: . HCV antibody was non-reactive. There is no laboratory  evidence of HCV infection. . In most cases, no further action is required. However, if recent HCV exposure is suspected, a test for HCV RNA (test code [id number]) is suggested. . For additional information please refer to http://education.quest[facility].com/faq/FAQ22v1 (This link is being provided for informational/ educational purposes only.) .   Vitamin D (25 hydroxy)     Status: Abnormal   Collection Time: 12/05/17  9:10 AM  Result Value Ref Range   VITD 26.17 (L) 30.00 - 100.00 ng/mL  TSH     Status: None   Collection Time: 12/05/17  9:10 AM  Result Value Ref Range   TSH 1.67 0.35 - 4.50 uIU/mL  Comp Met (CMET)     Status: Abnormal   Collection Time: 12/05/17  9:10 AM  Result Value Ref Range   Sodium 139 135 - 145 mEq/L   Potassium 4.5 3.5 - 5.1 mEq/L   Chloride 104 96 - 112 mEq/L   CO2 28 19 - 32 mEq/L   Glucose, Bld 142 (H) 70 - 99 mg/dL   BUN 20 6 - 23 mg/dL   Creatinine, Ser 0.95 0.40 - 1.50 mg/dL   Total Bilirubin 0.6 0.2 - 1.2 mg/dL   Alkaline Phosphatase 66 39 - 117 U/L   AST 22 0 - 37 U/L   ALT 24 0 - 53 U/L   Total Protein 7.2 6.0 - 8.3 g/dL   Albumin 4.1 3.5 - 5.2 g/dL   Calcium 9.3 8.4 - 10.5 mg/dL   GFR 86.42 >60.00 mL/min    No results found.   ROS: 14 pt review of systems performed and negative (unless mentioned in an HPI)  Objective: BP 109/73 (BP Location: Left Arm, Patient Position: Sitting,  Cuff Size: Large)   Pulse (!) 52   Temp (!) 97.5 F (36.4 C)   Resp 20   Ht 6' (1.829 m)   Wt 208 lb 3.2 oz (94.4 kg)   SpO2 98%   BMI 28.24 kg/m  Gen: Afebrile. No acute distress. Nontoxic in appearance, well-developed, well-nourished, does not, overweight, Caucasian male HENT: AT. [place]. Bilateral TM visualized and normal in appearance, normal external auditory canal. MMM, no oral lesions, adequate dentition. Bilateral nares within normal limits. Throat without erythema, ulcerations or exudates.  No cough on exam, no hoarseness on exam. Eyes:Pupils Equal Round Reactive to light, Extraocular movements intact,  Conjunctiva without redness, discharge or icterus. Neck/lymp/endocrine: Supple, no lymphadenopathy, no thyromegaly CV: RRR no murmur, no edema, +2/4 P posterior tibialis pulses.  No carotid bruits. No JVD. Chest: CTAB, no wheeze, rhonchi or crackles.  Normal respiratory effort.  Good air movement. Abd: Soft.  Flat. NTND. BS present.  No masses palpated. No hepatosplenomegaly. No rebound tenderness or guarding. Skin: No rashes, purpura or petechiae. Warm and well-perfused. Skin intact. Neuro/Msk:  Normal gait. PERLA. EOMi. Alert. Oriented x3.  Cranial  nerves II through XII intact. Muscle strength 5/5 upper/lower extremity. DTRs equal bilaterally. Psych: Normal affect, dress and demeanor. Normal speech. Normal thought content and judgment.  No exam data present  Assessment/plan: Corde Antonini is a 57 y.o. male present for CPE. Screening for HIV (human immunodeficiency virus) - HIV antibody (with reflex) Encounter for hepatitis C screening test for low risk patient - Hepatitis C Antibody Hyperlipidemia LDL goal <130/overweight/on statin therapy - Continue Crestor 5 mg daily and last lipid panel suggest need for increase - CBC w/Diff - Lipid panel Vitamin D deficiency - Vitamin D (25 hydroxy) High risk medication use - Comp Met (CMET) Screening for prostate cancer -  PSA Palpitation/ atrial fibrillation [facility]) Following with cardiology.  Started on flecainide a few weeks ago, still having palpitations and A.Fib - Comp Met (CMET) Immunization due - Tdap vaccine greater than or equal to 7yo IM Encounter for preventive health examination Patient was encouraged to exercise greater than 150 minutes a week. Patient was encouraged to choose a diet filled with fresh fruits and vegetables, and lean meats. AVS provided to patient today for education/recommendation on gender specific health and safety maintenance. Colonoscopy: last screen 2011, recommend follow up 10 years. Immunizations:  tdap updated today, influenza up-to-date 2018, Prevnar completed 08/29/2017, Pneumovax due February 2020, discussed Shingrix vaccination and he will check with rheumatology. Infectious disease screening: HIV and Hep C and agreeable to testing today. type 2 diabetes - Stable. A1c 5.8--> 6.4--> collected today. Continue with metformin 500 mg twice a day. -  Needs to refocus, and start watching diet closely again. - Continue checking fasting glucose couple times a week.  He has stopped checking. - Flu shot completed 2018 - Prevnar completed today 08/29/2017 - Microalbumin completed today 08/29/2017 - Foot exam completed today 08/29/2017 - Follow-up 3-4 months   Return in about 4 months (around 04/07/2018) for diabetes.  Note is dictated utilizing voice recognition software. Although note has been proof read prior to signing, occasional typographical errors still can be missed. If any questions arise, please do not hesitate to call for verification.  Electronically signed by: Bruce Pouch, DO [place]

## 2017-12-06 ENCOUNTER — Encounter: Payer: Self-pay | Admitting: Family Medicine

## 2017-12-06 LAB — HEPATITIS C ANTIBODY
Hepatitis C Ab: NONREACTIVE
SIGNAL TO CUT-OFF: 0.02 (ref <1.00)

## 2017-12-06 LAB — HIV ANTIBODY (ROUTINE TESTING W REFLEX): HIV 1&2 Ab, 4th Generation: NONREACTIVE

## 2017-12-06 MED ORDER — ROSUVASTATIN CALCIUM 5 MG PO TABS: ORAL_TABLET | ORAL | 3 refills | Status: DC

## 2017-12-06 MED ORDER — OMEPRAZOLE 20 MG PO CPDR: 20.0000 mg | DELAYED_RELEASE_CAPSULE | Freq: Every day | ORAL | 3 refills | Status: DC

## 2017-12-06 MED ORDER — METFORMIN HCL 500 MG PO TABS: 500.0000 mg | ORAL_TABLET | Freq: Two times a day (BID) | ORAL | 1 refills | Status: DC

## 2017-12-09 ENCOUNTER — Telehealth: Payer: Self-pay | Admitting: Family Medicine

## 2017-12-09 NOTE — Telephone Encounter (Signed)
Please inform patient the following information: His blood glucose was elevated at 142 fasting and his a1c was 6.8, which is elevated from prior. Take metformin routinely and try to watch diet and exercise.  The rest of his labs are stable.   - His vit D was a little low at 26, I would suggest he increase his vit d to 600-800u daily.

## 2017-12-09 NOTE — Telephone Encounter (Signed)
Spoke with patient reviewed lab results and instructions. Patient verbalized understanding. 

## 2017-12-17 DIAGNOSIS — I480 Paroxysmal atrial fibrillation: Secondary | ICD-10-CM | POA: Insufficient documentation

## 2017-12-17 NOTE — Progress Notes (Signed)
Cardiology Office Note:    Date:  12/18/2017   ID:  Bruce Kelly, DOB [birth year], MRN [id number]  PCP:  [name], DO  Cardiologist:  Shirlee More, MD    Referring MD: [name], DO    ASSESSMENT:    1. High risk medication use   2. PAF (paroxysmal atrial fibrillation) (HCC)    PLAN:    In order of problems listed above:  1. PAF on flecanide   Next appointment: After EP evaluation   Medication Adjustments/Labs and Tests Ordered: Current medicines are reviewed at length with the patient today.  Concerns regarding medicines are outlined above.  No orders of the defined types were placed in this encounter.  No orders of the defined types were placed in this encounter.   No chief complaint on file.   History of Present Illness:    Bruce Kelly is a 57 y.o. male with a hx of PAF last seen 09/26/17. He has documented PAF and started flecanide 11/18/17.  ASSESSMENT:    09/26/17   1. Palpitation   2. High risk medication use   3. Type 2 diabetes mellitus without complication, without long-term current use of insulin ([place])   4. Hyperlipidemia LDL goal <130    PLAN:    1. His symptoms are significant persistent suggestive of and he is at risk of atrial fibrillation.  Unfortunately they are infrequent not captured we explored the options of event monitor versus iPhone adapter to capture EKG he prefers the latter will utilize it for 3 months and if he captures atrial fibrillation will contact me.  For completeness I will check his electrolytes magnesium I asked him to avoid over-the-counter medications that are proarrhythmic. 2. His biologic agent puts him at risk for adverse cardiovascular events of heart failure and asked him to have echocardiogram performed.  I do not think that he has heart failure cardiomyopathy or needs to interrupt his treatment providers his structure and function are normal. 3. Stable well managed with diet weight loss and  metformin 4. Stable continue with statin  Compliance with diet, lifestyle and medications: Yes  He is under intense stress at work.  He has had recurrences of documented atrial fibrillation with his heart rate monitor lasting up to 8 hours.  Clinically I think is failed antiarrhythmic therapy I asked him to stop aspirin start an anticoagulant referral to EP for consideration of EP intervention for paroxysmal atrial fibrillation.  He has had no syncope TIA. Past Medical History:  Diagnosis Date  . Closed right ankle fracture   . Colon polyp 2011   x3 polyps, pts reports 10 year follow up  . 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 ED (erectile dysfunction) 09/23/2017  . Elevated ferritin 2013   479 -->  return to normal  . Encounter for long-term (current) use of other medications 09/23/2017  . Erectile dysfunction 2013   Had been on Cialis  . GERD (gastroesophageal reflux disease) 2013  . Hyperlipidemia LDL goal <130   . Irregular heart rate 09/23/2017  . Neuropathy 09/23/2017  . On statin therapy 04/15/2017  . Osteopenia 2013   prior h/o of Chronic steroids for 10 years; was on Boniva for 6 months. Now takes calcium/vitamin D daily.  . Other and unspecified hyperlipidemia 09/23/2017  . Overweight (BMI 25.0-29.9) 11/22/2016  . Peripheral neuropathy 2009   Did not respond to gabapentin by prior records  . Prediabetes   .  RA (rheumatoid arthritis) ([place]) 09/23/2017  . Rheumatoid arthritis ([place]) 1998   had been on chronic steroids for about 10 years  . Rheumatoid lung ([place])    pt reports having thoracentesis for rheumatoid lung many years ago.   . Thrombocytopenia ([place]) 2013   Mildly low at 130  . Type 2 diabetes mellitus without complication, without long-term current use of insulin ([place]) 11/22/2016  . Vitamin D deficiency 11/22/2016  . Wears glasses    [facility]    Past Surgical History:  Procedure Laterality Date  . NO PAST SURGERIES       Current Medications: Current Meds  Medication Sig  . aspirin EC 81 MG tablet Take 81 mg by mouth daily.  Marland Kitchen b complex vitamins capsule Take 1 capsule by mouth daily.  . blood glucose meter kit and supplies Dispense based on patient and insurance  Check blood sugar once daily.  . calcium-vitamin D (OSCAL WITH D) 500-200 MG-UNIT tablet Take 1 tablet by mouth daily with breakfast.   . flecainide (TAMBOCOR) 50 MG tablet Take 1 tablet (50 mg total) by mouth 2 (two) times daily.  . folic acid (FOLVITE) 1 MG tablet Take 1 tablet by mouth daily.  . Golimumab (SIMPONI) 50 MG/0.5ML SOSY Inject 50 Units into the skin every 30 (thirty) days.  . meloxicam (MOBIC) 7.5 MG tablet Take 2 tablets by mouth daily.  . metFORMIN (GLUCOPHAGE) 500 MG tablet Take 1 tablet (500 mg total) by mouth 2 (two) times daily with a meal.  . methotrexate (RHEUMATREX) 2.5 MG tablet Take 4 tablets by mouth once a week.  . Multiple Vitamin (MULTIVITAMIN) capsule Take 1 capsule by mouth daily. Takes womens for extra calcium  . omeprazole (PRILOSEC) 20 MG capsule Take 1 capsule (20 mg total) by mouth daily.  . rosuvastatin (CRESTOR) 5 MG tablet 3 times week     Allergies:   Penicillins and Sulfa antibiotics   Social History   Socioeconomic History  . Marital status: Married    Spouse name: Bruce Kelly  . Number of children: Not on file  . Years of education: 47  . Highest education level: Not on file  Occupational History  . Not on file  Social Needs  . Financial resource strain: Not on file  . Food insecurity:    Worry: Not on file    Inability: Not on file  . Transportation needs:    Medical: Not on file    Non-medical: Not on file  Tobacco Use  . Smoking status: Former Smoker    Packs/day: 1.50    Years: 10.00    Pack years: 15.00    Types: Cigarettes    Last attempt to quit: 1999    Years since quitting: 20.4  . Smokeless tobacco: Never Used  Substance and Sexual Activity  . Alcohol use: Yes     Alcohol/week: 2.4 oz    Types: 2 Cans of beer, 2 Shots of liquor per week  . Drug use: No  . Sexual activity: Yes    Partners: Female    Birth control/protection: Pill  Lifestyle  . Physical activity:    Days per week: Not on file    Minutes per session: Not on file  . Stress: Not on file  Relationships  . Social connections:    Talks on phone: Not on file    Gets together: Not on file    Attends religious service: Not on file    Active member of club or organization:  Not on file    Attends meetings of clubs or organizations: Not on file    Relationship status: Not on file  Other Topics Concern  . Not on file  Social History Narrative   Drinks caffeine, takes a daily vitamin.   Masters degree, Materials engineer.   Exercises routinely.   Wears his seatbelt, firearms locked in the home.   Feels  safe in his relationships.     Family History: The patient's family history includes Alcohol abuse in his brother; Arthritis in his mother and sister; Breast cancer in his paternal aunt; Diabetes in his father and sister; Drug abuse in his brother; Early death in his maternal grandfather; Heart disease in his father. ROS:   Please see the history of present illness.    All other systems reviewed and are negative.  EKGs/Labs/Other Studies Reviewed:    The following studies were reviewed today:  I reviewed the Kardia monitor strips with documented recurrences of atrial fibrillation with rapid rate  Recent Labs: 09/26/2017: Magnesium 2.0 12/05/2017: ALT 24; BUN 20; Creatinine, Ser 0.95; Hemoglobin 15.2; Platelets 141.0; Potassium 4.5; Sodium 139; TSH 1.67  Recent Lipid Panel    Component Value Date/Time   CHOL 173 12/05/2017 0910   TRIG 135.0 12/05/2017 0910   HDL 43.40 12/05/2017 0910   CHOLHDL 4 12/05/2017 0910   VLDL 27.0 12/05/2017 0910   LDLCALC 103 (H) 12/05/2017 0910    Physical Exam:    VS:  BP 122/70 (BP Location: Right Arm, Patient Position: Sitting, Cuff Size: Normal)    Pulse (!) 55   Ht 6' (1.829 m)   Wt 210 lb (95.3 kg)   SpO2 97%   BMI 28.48 kg/m     Wt Readings from Last 3 Encounters:  12/18/17 210 lb (95.3 kg)  12/05/17 208 lb 3.2 oz (94.4 kg)  09/26/17 203 lb (92.1 kg)     GEN:  Well nourished, well developed in no acute distress HEENT: Normal NECK: No JVD; No carotid bruits LYMPHATICS: No lymphadenopathy CARDIAC: RRR, no murmurs, rubs, gallops RESPIRATORY:  Clear to auscultation without rales, wheezing or rhonchi  ABDOMEN: Soft, non-tender, non-distended MUSCULOSKELETAL:  No edema; No deformity  SKIN: Warm and dry NEUROLOGIC:  Alert and oriented x 3 PSYCHIATRIC:  Normal affect    Signed, Shirlee More, MD  12/18/2017 8:25 AM    [place]

## 2017-12-18 ENCOUNTER — Ambulatory Visit (INDEPENDENT_AMBULATORY_CARE_PROVIDER_SITE_OTHER): Payer: 59 | Admitting: Cardiology

## 2017-12-18 ENCOUNTER — Encounter: Payer: Self-pay | Admitting: Cardiology

## 2017-12-18 VITALS — BP 122/70 | HR 55 | Ht 72.0 in | Wt 210.0 lb

## 2017-12-18 DIAGNOSIS — Z79899 Other long term (current) drug therapy: Secondary | ICD-10-CM

## 2017-12-18 DIAGNOSIS — I480 Paroxysmal atrial fibrillation: Secondary | ICD-10-CM | POA: Diagnosis not present

## 2017-12-18 MED ORDER — APIXABAN 5 MG PO TABS: 5.0000 mg | ORAL_TABLET | Freq: Two times a day (BID) | ORAL | 3 refills | Status: DC

## 2017-12-18 NOTE — Patient Instructions (Signed)
Medication Instructions:  Your physician has recommended you make the following change in your medication:  STOP aspirin START apixaban (Eliquis) 5 mg twice daily  Labwork: None  Testing/Procedures: You had an EKG today.  Follow-Up: Your physician recommends that you schedule a follow-up appointment pending consult with Dr. Curt Bears.  You have been referred to Dr. Curt Bears with electrophysiology. You will be contacted to schedule an appointment.  Any Other Special Instructions Will Be Listed Below (If Applicable).     If you need a refill on your cardiac medications before your next appointment, please call your pharmacy.

## 2017-12-20 DIAGNOSIS — I499 Cardiac arrhythmia, unspecified: Secondary | ICD-10-CM | POA: Diagnosis not present

## 2017-12-20 DIAGNOSIS — M150 Primary generalized (osteo)arthritis: Secondary | ICD-10-CM | POA: Diagnosis not present

## 2017-12-20 DIAGNOSIS — M0579 Rheumatoid arthritis with rheumatoid factor of multiple sites without organ or systems involvement: Secondary | ICD-10-CM | POA: Diagnosis not present

## 2017-12-24 ENCOUNTER — Ambulatory Visit (INDEPENDENT_AMBULATORY_CARE_PROVIDER_SITE_OTHER): Payer: 59 | Admitting: Cardiology

## 2017-12-24 ENCOUNTER — Encounter: Payer: Self-pay | Admitting: Cardiology

## 2017-12-24 VITALS — BP 132/74 | HR 130 | Ht 72.0 in | Wt 205.0 lb

## 2017-12-24 DIAGNOSIS — I480 Paroxysmal atrial fibrillation: Secondary | ICD-10-CM | POA: Diagnosis not present

## 2017-12-24 DIAGNOSIS — E785 Hyperlipidemia, unspecified: Secondary | ICD-10-CM | POA: Diagnosis not present

## 2017-12-24 MED ORDER — AMIODARONE HCL 200 MG PO TABS: ORAL_TABLET | ORAL | 1 refills | Status: DC

## 2017-12-24 NOTE — Patient Instructions (Addendum)
Medication Instructions:  Your physician has recommended you make the following change in your medication:  1.START Amiodarone   Take 1 tablet (200 mg total) twice a day for 2 weeks, then  Take 1 tablet (200 mg total) once a day  Labwork: None ordered  Testing/Procedures: Your physician has requested that you have cardiac CT within 7 days PRIOR to ablation. Cardiac computed tomography (CT) is a painless test that uses an x-ray machine to take clear, detailed pictures of your heart. For further information please visit [email or web address]. Please follow instruction sheet as given.  Your physician has recommended that you have an ablation. Catheter ablation is a medical procedure used to treat some cardiac arrhythmias (irregular heartbeats). During catheter ablation, a long, thin, flexible tube is put into a blood vessel in your groin (upper thigh), or neck. This tube is called an ablation catheter. It is then guided to your heart through the blood vessel. Radio frequency waves destroy small areas of heart tissue where abnormal heartbeats may cause an arrhythmia to start. Please see the instructions below.   Instructions for your ablation: 1. Please arrive at the [facility], Main Entrance "A", of [facility] at 5:30 a.m. on 02/07/2018. 2. Do not eat or drink after midnight the night prior to the procedure. 3. Do not miss any doses of ELIQUIS prior to the morning of the procedure.  4. Do not take any medications the morning of the procedure. 5. You chest will need to be shaved for this procedure (if needed). We ask that you shave these areas yourself at home 1-2 days prior to the procedure. If you are uncomfortable/unable, then hospital staff will shave these areas the morning of your procedure. 6. Plan for an overnight stay in the hospital. 7. You will need someone to drive you home at discharge.   Follow-Up: Keep your follow up on 01/29/2018 @ 3:45 pm, with Dr. Curt Bears, at the [facility].  Your physician recommends that you schedule a follow-up appointment in: 4 weeks, after your procedure on 02/07/2018, with Roderic Palau in the AF[facility] clinic.  Your physician recommends that you schedule a follow-up appointment in: 3 months, after your procedure on 02/07/2018, with Dr. Curt Bears    * If you need a refill on your cardiac medications before your next appointment, please call your pharmacy.   *Please note that any paperwork needing to be filled out by the provider will need to be addressed at the front desk prior to seeing the provider. Please note that any FMLA, disability or other documents regarding health condition is subject to a $25.00 charge that must be received prior to completion of paperwork in the form of a money order or check.  Thank you for choosing [facility]!!   Trinidad Curet, RN [phone]  Any Other Special Instructions Will Be Listed Below (If Applicable).  CT INSTRUCTIONS Please arrive at the [facility] main entrance of [facility] at _______ AM (30-45 minutes prior to test start time)  [facility] [place], [place] [address] [phone]  Proceed to the [facility] Radiology Department (First Floor).  Please follow these instructions carefully (unless otherwise directed):  Hold all erectile dysfunction medications at least 48 hours prior to test.  On the Night Before the Test: . Drink plenty of water. . Do not consume any caffeinated/decaffeinated beverages or chocolate 12 hours prior to your test. . Do not take any antihistamines 12 hours prior to  your test. . If you take Metformin do not take 24 hours prior to test. . If the patient has contrast allergy: ? Patient will need a prescription for Prednisone and very clear instructions (as follows): 1. Prednisone 50 mg - take 13 hours prior to test 2. Take another Prednisone 50 mg 7 hours prior to test 3. Take another Prednisone 50 mg 1  hour prior to test 4. Take Benadryl 50 mg 1 hour prior to test . Patient must complete all four doses of above prophylactic medications. . Patient will need a ride after test due to Benadryl.  On the Day of the Test: . Drink plenty of water. Do not drink any water within one hour of the test. . Do not eat any food 4 hours prior to the test. . You may take your regular medications prior to the test. . IF NOT ON A BETA BLOCKER - Take 50 mg of lopressor (metoprolol) one hour before the test. . HOLD Furosemide morning of the test.  After the Test: . Drink plenty of water. . After receiving IV contrast, you may experience a mild flushed feeling. This is normal. . On occasion, you may experience a mild rash up to 24 hours after the test. This is not dangerous. If this occurs, you can take Benadryl 25 mg and increase your fluid intake. . If you experience trouble breathing, this can be serious. If it is severe call 911 IMMEDIATELY. If it is mild, please call our office. . If you take any of these medications: Glipizide/Metformin, Avandament, Glucavance, please do not take 48 hours after completing test.    Cardiac Ablation Cardiac ablation is a procedure to disable (ablate) a small amount of heart tissue in very specific places. The heart has many electrical connections. Sometimes these connections are abnormal and can cause the heart to beat very fast or irregularly. Ablating some of the problem areas can improve the heart rhythm or return it to normal. Ablation may be done for people who:  Have Wolff-Parkinson-White syndrome.  Have fast heart rhythms (tachycardia).  Have taken medicines for an abnormal heart rhythm (arrhythmia) that were not effective or caused side effects.  Have a high-risk heartbeat that may be life-threatening.  During the procedure, a small incision is made in the neck or the groin, and a long, thin, flexible tube (catheter) is inserted into the incision and moved to  the heart. Small devices (electrodes) on the tip of the catheter will send out electrical currents. A type of X-ray (fluoroscopy) will be used to help guide the catheter and to provide images of the heart. Tell a health care provider about:  Any allergies you have.  All medicines you are taking, including vitamins, herbs, eye drops, creams, and over-the-counter medicines.  Any problems you or family members have had with anesthetic medicines.  Any blood disorders you have.  Any surgeries you have had.  Any medical conditions you have, such as kidney failure.  Whether you are pregnant or may be pregnant. What are the risks? Generally, this is a safe procedure. However, problems may occur, including:  Infection.  Bruising and bleeding at the catheter insertion site.  Bleeding into the chest, especially into the sac that surrounds the heart. This is a serious complication.  Stroke or blood clots.  Damage to other structures or organs.  Allergic reaction to medicines or dyes.  Need for a permanent pacemaker if the normal electrical system is damaged. A pacemaker is a Animal nutritionist  that sends electrical signals to the heart and helps your heart beat normally.  The procedure not being fully effective. This may not be recognized until months later. Repeat ablation procedures are sometimes required.  What happens before the procedure?  Follow instructions from your health care provider about eating or drinking restrictions.  Ask your health care provider about: ? Changing or stopping your regular medicines. This is especially important if you are taking diabetes medicines or blood thinners. ? Taking medicines such as aspirin and ibuprofen. These medicines can thin your blood. Do not take these medicines before your procedure if your health care provider instructs you not to.  Plan to have someone take you home from the hospital or clinic.  If you will be going home right after  the procedure, plan to have someone with you for 24 hours. What happens during the procedure?  To lower your risk of infection: ? Your health care team will wash or sanitize their hands. ? Your skin will be washed with soap. ? Hair may be removed from the incision area.  An IV tube will be inserted into one of your veins.  You will be given a medicine to help you relax (sedative).  The skin on your neck or groin will be numbed.  An incision will be made in your neck or your groin.  A needle will be inserted through the incision and into a large vein in your neck or groin.  A catheter will be inserted into the needle and moved to your heart.  Dye may be injected through the catheter to help your surgeon see the area of the heart that needs treatment.  Electrical currents will be sent from the catheter to ablate heart tissue in desired areas. There are three types of energy that may be used to ablate heart tissue: ? Heat (radiofrequency energy). ? Laser energy. ? Extreme cold (cryoablation).  When the necessary tissue has been ablated, the catheter will be removed.  Pressure will be held on the catheter insertion area to prevent excessive bleeding.  A bandage (dressing) will be placed over the catheter insertion area. The procedure may vary among health care providers and hospitals. What happens after the procedure?  Your blood pressure, heart rate, breathing rate, and blood oxygen level will be monitored until the medicines you were given have worn off.  Your catheter insertion area will be monitored for bleeding. You will need to lie still for a few hours to ensure that you do not bleed from the catheter insertion area.  Do not drive for 24 hours or as long as directed by your health care provider. Summary  Cardiac ablation is a procedure to disable (ablate) a small amount of heart tissue in very specific places. Ablating some of the problem areas can improve the heart  rhythm or return it to normal.  During the procedure, electrical currents will be sent from the catheter to ablate heart tissue in desired areas. This information is not intended to replace advice given to you by your health care provider. Make sure you discuss any questions you have with your health care provider. Document Released: 11/11/2008 Document Revised: 05/14/2016 Document Reviewed: 05/14/2016 Elsevier Interactive Patient Education  2018 [employer].   Amiodarone tablets What is this medicine? AMIODARONE (a MEE oh da rone) is an antiarrhythmic drug. It helps make your heart beat regularly. Because of the side effects caused by this medicine, it is only used when other medicines have not worked.  It is usually used for heartbeat problems that may be life threatening. This medicine may be used for other purposes; ask your health care provider or pharmacist if you have questions. COMMON BRAND NAME(S): Cordarone, Pacerone What should I tell my health care provider before I take this medicine? They need to know if you have any of these conditions: -liver disease -lung disease -other heart problems -thyroid disease -an unusual or allergic reaction to amiodarone, iodine, other medicines, foods, dyes, or preservatives -pregnant or trying to get pregnant -breast-feeding How should I use this medicine? Take this medicine by mouth with a glass of water. Follow the directions on the prescription label. You can take this medicine with or without food. However, you should always take it the same way each time. Take your doses at regular intervals. Do not take your medicine more often than directed. Do not stop taking except on the advice of your doctor or health care professional. A special MedGuide will be given to you by the pharmacist with each prescription and refill. Be sure to read this information carefully each time. Talk to your pediatrician regarding the use of this medicine in  children. Special care may be needed. Overdosage: If you think you have taken too much of this medicine contact a poison control center or emergency room at once. NOTE: This medicine is only for you. Do not share this medicine with others. What if I miss a dose? If you miss a dose, take it as soon as you can. If it is almost time for your next dose, take only that dose. Do not take double or extra doses. What may interact with this medicine? Do not take this medicine with any of the following medications: -abarelix -apomorphine -arsenic trioxide -certain antibiotics like erythromycin, gemifloxacin, levofloxacin, pentamidine -certain medicines for depression like amoxapine, tricyclic antidepressants -certain medicines for fungal infections like fluconazole, itraconazole, ketoconazole, posaconazole, voriconazole -certain medicines for irregular heart beat like disopyramide, dofetilide, dronedarone, ibutilide, propafenone, sotalol -certain medicines for malaria like chloroquine, halofantrine -cisapride -droperidol -haloperidol -hawthorn -maprotiline -methadone -phenothiazines like chlorpromazine, mesoridazine, thioridazine -pimozide -ranolazine -red yeast rice -vardenafil -ziprasidone This medicine may also interact with the following medications: -antiviral medicines for HIV or AIDS -certain medicines for blood pressure, heart disease, irregular heart beat -certain medicines for cholesterol like atorvastatin, cerivastatin, lovastatin, simvastatin -certain medicines for hepatitis C like sofosbuvir and ledipasvir; sofosbuvir -certain medicines for seizures like phenytoin -certain medicines for thyroid problems -certain medicines that treat or prevent blood clots like warfarin -cholestyramine -cimetidine -clopidogrel -cyclosporine -dextromethorphan -diuretics -fentanyl -general anesthetics -grapefruit juice -lidocaine -loratadine -methotrexate -other medicines that prolong  the QT interval (cause an abnormal heart rhythm) -procainamide -quinidine -rifabutin, rifampin, or rifapentine -St. John's Wort -trazodone This list may not describe all possible interactions. Give your health care provider a list of all the medicines, herbs, non-prescription drugs, or dietary supplements you use. Also tell them if you smoke, drink alcohol, or use illegal drugs. Some items may interact with your medicine. What should I watch for while using this medicine? Your condition will be monitored closely when you first begin therapy. Often, this drug is first started in a hospital or other monitored health care setting. Once you are on maintenance therapy, visit your doctor or health care professional for regular checks on your progress. Because your condition and use of this medicine carry some risk, it is a good idea to carry an identification card, necklace or bracelet with details of your condition, medications, and doctor or  health care professional. Dennis Bast may get drowsy or dizzy. Do not drive, use machinery, or do anything that needs mental alertness until you know how this medicine affects you. Do not stand or sit up quickly, especially if you are an older patient. This reduces the risk of dizzy or fainting spells. This medicine can make you more sensitive to the sun. Keep out of the sun. If you cannot avoid being in the sun, wear protective clothing and use sunscreen. Do not use sun lamps or tanning beds/booths. You should have regular eye exams before and during treatment. Call your doctor if you have blurred vision, see halos, or your eyes become sensitive to light. Your eyes may get dry. It may be helpful to use a lubricating eye solution or artificial tears solution. If you are going to have surgery or a procedure that requires contrast dyes, tell your doctor or health care professional that you are taking this medicine. What side effects may I notice from receiving this  medicine? Side effects that you should report to your doctor or health care professional as soon as possible: -allergic reactions like skin rash, itching or hives, swelling of the face, lips, or tongue -blue-gray coloring of the skin -blurred vision, seeing blue green halos, increased sensitivity of the eyes to light -breathing problems -chest pain -dark urine -fast, irregular heartbeat -feeling faint or light-headed -intolerance to heat or cold -nausea or vomiting -pain and swelling of the scrotum -pain, tingling, numbness in feet, hands -redness, blistering, peeling or loosening of the skin, including inside the mouth -spitting up blood -stomach pain -sweating -unusual or uncontrolled movements of body -unusually weak or tired -weight gain or loss -yellowing of the eyes or skin Side effects that usually do not require medical attention (report to your doctor or health care professional if they continue or are bothersome): -change in sex drive or performance -constipation -dizziness -headache -loss of appetite -trouble sleeping This list may not describe all possible side effects. Call your doctor for medical advice about side effects. You may report side effects to FDA at 1-800-FDA-1088. Where should I keep my medicine? Keep out of the reach of children. Store at room temperature between 20 and 25 degrees C (68 and 77 degrees F). Protect from light. Keep container tightly closed. Throw away any unused medicine after the expiration date. NOTE: This sheet is a summary. It may not cover all possible information. If you have questions about this medicine, talk to your doctor, pharmacist, or health care provider.  2018 Elsevier/Gold Standard (2013-09-28 19:48:11)

## 2017-12-24 NOTE — Progress Notes (Signed)
Electrophysiology Office Note   Date:  12/24/2017   ID:  Bruce Kelly, DOB 1960/08/13, MRN [id number]  PCP:  [name], DO  Cardiologist:  Bettina Gavia Primary Electrophysiologist:  [name], MD    No chief complaint on file.    History of Present Illness: Bruce Kelly is a 57 y.o. male who is being seen today for the evaluation of atrial fibrillation at the request of Shirlee More. Presenting today for electrophysiology evaluation.  He has a history of hyperlipidemia, atrial fibrillation, and type 2 diabetes.  He is currently on flecainide and he is having unfortunately breakthrough atrial fibrillation.  His symptoms from his atrial fibrillation are weakness, fatigue.  Over the past few weeks he has had quite a bit more atrial fibrillation with episodes occurring every other day and lasting up to 8 hours.     Today, he denies symptoms of palpitations, chest pain, shortness of breath, orthopnea, PND, lower extremity edema, claudication, dizziness, presyncope, syncope, bleeding, or neurologic sequela. The patient is tolerating medications without difficulties.    Past Medical History:  Diagnosis Date  . Closed right ankle fracture   . Colon polyp 2011   x3 polyps, pts reports 10 year follow up  . 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 ED (erectile dysfunction) 09/23/2017  . Elevated ferritin 2013   479 -->  return to normal  . Encounter for long-term (current) use of other medications 09/23/2017  . Erectile dysfunction 2013   Had been on Cialis  . GERD (gastroesophageal reflux disease) 2013  . Hyperlipidemia LDL goal <130   . Irregular heart rate 09/23/2017  . Neuropathy 09/23/2017  . On statin therapy 04/15/2017  . Osteopenia 2013   prior h/o of Chronic steroids for 10 years; was on Boniva for 6 months. Now takes calcium/vitamin D daily.  . Other and unspecified hyperlipidemia 09/23/2017  . Overweight (BMI 25.0-29.9) 11/22/2016   . Peripheral neuropathy 2009   Did not respond to gabapentin by prior records  . Prediabetes   . RA (rheumatoid arthritis) ([place]) 09/23/2017  . Rheumatoid arthritis ([place]) 1998   had been on chronic steroids for about 10 years  . Rheumatoid lung ([place])    pt reports having thoracentesis for rheumatoid lung many years ago.   . Thrombocytopenia ([place]) 2013   Mildly low at 130  . Type 2 diabetes mellitus without complication, without long-term current use of insulin ([place]) 11/22/2016  . Vitamin D deficiency 11/22/2016  . Wears glasses    [facility]   Past Surgical History:  Procedure Laterality Date  . NO PAST SURGERIES       Current Outpatient Medications  Medication Sig Dispense Refill  . apixaban (ELIQUIS) 5 MG TABS tablet Take 1 tablet (5 mg total) by mouth 2 (two) times daily. 180 tablet 3  . b complex vitamins capsule Take 1 capsule by mouth daily.    . blood glucose meter kit and supplies Dispense based on patient and insurance  Check blood sugar once daily. 1 each 0  . calcium-vitamin D (OSCAL WITH D) 500-200 MG-UNIT tablet Take 1 tablet by mouth daily with breakfast.     . folic acid (FOLVITE) 1 MG tablet Take 1 tablet by mouth daily.    . Golimumab (SIMPONI) 50 MG/0.5ML SOSY Inject 50 Units into the skin every 30 (thirty) days.    . meloxicam (MOBIC) 7.5 MG tablet Take 2 tablets by mouth daily.    Marland Kitchen  metFORMIN (GLUCOPHAGE) 500 MG tablet Take 1 tablet (500 mg total) by mouth 2 (two) times daily with a meal. 180 tablet 1  . methotrexate (RHEUMATREX) 2.5 MG tablet Take 4 tablets by mouth once a week.    . Multiple Vitamin (MULTIVITAMIN) capsule Take 1 capsule by mouth daily. Takes womens for extra calcium    . omeprazole (PRILOSEC) 20 MG capsule Take 1 capsule (20 mg total) by mouth daily. 90 capsule 3  . rosuvastatin (CRESTOR) 5 MG tablet 3 times week 45 tablet 3  . [START ON 12/28/2017] amiodarone (PACERONE) 200 MG tablet Take 1 tablet (200 mg total) twice a day for 2 weeks,  then reduce and take 1 tablet (200 mg total) once daily 90 tablet 1   No current facility-administered medications for this visit.     Allergies:   Penicillins and Sulfa antibiotics   Social History:  The patient  reports that he quit smoking about 20 years ago. His smoking use included cigarettes. He has a 15.00 pack-year smoking history. He has never used smokeless tobacco. He reports that he drinks about 2.4 oz of alcohol per week. He reports that he does not use drugs.   Family History:  The patient's family history includes Alcohol abuse in his brother; Arthritis in his mother and sister; Breast cancer in his paternal aunt; Diabetes in his father and sister; Drug abuse in his brother; Early death in his maternal grandfather; Heart disease in his father.    ROS:  Please see the history of present illness.   Otherwise, review of systems is positive for palpitations.   All other systems are reviewed and negative.    PHYSICAL EXAM: VS:  BP 132/74   Pulse (!) 130   Ht 6' (1.829 m)   Wt 205 lb (93 kg)   SpO2 97%   BMI 27.80 kg/m  , BMI Body mass index is 27.8 kg/m. GEN: Well nourished, well developed, in no acute distress  HEENT: normal  Neck: no JVD, carotid bruits, or masses Cardiac: iRRR; no murmurs, rubs, or gallops,no edema  Respiratory:  clear to auscultation bilaterally, normal work of breathing GI: soft, nontender, nondistended, + BS MS: no deformity or atrophy  Skin: warm and dry Neuro:  Strength and sensation are intact Psych: euthymic mood, full affect  EKG:  EKG is ordered today. Personal review of the ekg ordered shows AF, rate 130  Recent Labs: 09/26/2017: Magnesium 2.0 12/05/2017: ALT 24; BUN 20; Creatinine, Ser 0.95; Hemoglobin 15.2; Platelets 141.0; Potassium 4.5; Sodium 139; TSH 1.67    Lipid Panel     Component Value Date/Time   CHOL 173 12/05/2017 0910   TRIG 135.0 12/05/2017 0910   HDL 43.40 12/05/2017 0910   CHOLHDL 4 12/05/2017 0910   VLDL 27.0  12/05/2017 0910   LDLCALC 103 (H) 12/05/2017 0910     Wt Readings from Last 3 Encounters:  12/24/17 205 lb (93 kg)  12/18/17 210 lb (95.3 kg)  12/05/17 208 lb 3.2 oz (94.4 kg)      Other studies Reviewed: Additional studies/ records that were reviewed today include: TTE 10/25/17  Review of the above records today demonstrates:  - Left ventricle: The cavity size was normal. Wall thickness was   normal. Systolic function was normal. The estimated ejection   fraction was in the range of 55% to 60%. Wall motion was normal;   there were no regional wall motion abnormalities. Features are   consistent with a pseudonormal left ventricular filling pattern,  with concomitant abnormal relaxation and increased filling   pressure (grade 2 diastolic dysfunction). - Ascending aorta: The ascending aorta was moderately dilated. - Mitral valve: There was mild regurgitation. - Left atrium: The atrium was mildly dilated. - Right ventricle: The cavity size was moderately dilated. Wall   thickness was normal. - Right atrium: The atrium was moderately dilated.   ASSESSMENT AND PLAN:  1.  Paroxysmal atrial fibrillation: Has recently been put on Eliquis.  Is also on flecainide.  Having quite a bit of atrial fibrillation over the last few weeks.  We Dhyana Bastone stop his flecainide today and start him on amiodarone for short time for hopefully rhythm control.  His heart rate is quite rapid on his EKG today.  I did discuss with him options of ablation.  Risks and benefits were discussed.  Risks include bleeding, tamponade, heart block, stroke, damage to surrounding organs.  The patient understands these risks and is agreed to the procedure.  This patients CHA2DS2-VASc Score and unadjusted Ischemic Stroke Rate (% per year) is equal to 0.6 % stroke rate/year from a score of 1  Above score calculated as 1 point each if present [CHF, HTN, DM, Vascular=MI/PAD/Aortic Plaque, Age if 65-74, or Male] Above score  calculated as 2 points each if present [Age > 75, or Stroke/TIA/TE]  2. Hyperlipidemia: continue crestor    Current medicines are reviewed at length with the patient today.   The patient does not have concerns regarding his medicines.  The following changes were made today: Increase flecainide, start Toprol-XL  Labs/ tests ordered today include:  Orders Placed This Encounter  Procedures  . CT CARDIAC MORPH/PULM VEIN W/CM&W/O CA SCORE  . CT CORONARY FRACTIONAL FLOW RESERVE DATA PREP  . CT CORONARY FRACTIONAL FLOW RESERVE FLUID ANALYSIS  . EKG 12-Lead   Case discussed with primary cardiology  Disposition:   FU with Kesley Gaffey 1 months  Signed, Tudor Chandley Meredith Leeds, MD  12/24/2017 4:42 PM     [place] [phone] (office) [phone] (fax)

## 2018-01-29 ENCOUNTER — Ambulatory Visit (INDEPENDENT_AMBULATORY_CARE_PROVIDER_SITE_OTHER): Payer: 59 | Admitting: Cardiology

## 2018-01-29 ENCOUNTER — Encounter: Payer: Self-pay | Admitting: Cardiology

## 2018-01-29 VITALS — BP 130/80 | HR 55 | Ht 72.0 in | Wt 206.4 lb

## 2018-01-29 DIAGNOSIS — Z01812 Encounter for preprocedural laboratory examination: Secondary | ICD-10-CM | POA: Diagnosis not present

## 2018-01-29 DIAGNOSIS — E785 Hyperlipidemia, unspecified: Secondary | ICD-10-CM

## 2018-01-29 DIAGNOSIS — I480 Paroxysmal atrial fibrillation: Secondary | ICD-10-CM | POA: Diagnosis not present

## 2018-01-29 NOTE — Progress Notes (Signed)
Electrophysiology Office Note   Date:  01/29/2018   ID:  Bruce Kelly, DOB 1960/09/26, MRN [id number]  PCP:  Bruce Hillock, DO  Cardiologist:  Bruce Kelly Primary Electrophysiologist:  Bruce Haw, MD    Chief Complaint  Patient presents with  . Follow-up    PAF/H&P pre procedure labs     History of Present Illness: Bruce Kelly is a 57 y.o. male who is being seen today for the evaluation of atrial fibrillation at the request of Bruce Kelly. Presenting today for electrophysiology evaluation.  He has a history of hyperlipidemia, atrial fibrillation, and type 2 diabetes.  He is currently on flecainide and he is having unfortunately breakthrough atrial fibrillation.  His symptoms from his atrial fibrillation are weakness, fatigue.  Over the past few weeks he has had quite a bit Kelly atrial fibrillation with episodes occurring every other day and lasting up to 8 hours.  Today, denies symptoms of palpitations, chest pain, shortness of breath, orthopnea, PND, lower extremity edema, claudication, dizziness, presyncope, syncope, bleeding, or neurologic sequela. The patient is tolerating medications without difficulties.  Overall he is feeling well.  He is started amiodarone and this is greatly improved his atrial fibrillation symptoms.  He is having episodes of atrial fibrillation once every 2 weeks.   Past Medical History:  Diagnosis Date  . Closed right ankle fracture   . Colon polyp 2011   x3 polyps, pts reports 10 year follow up  . 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 ED (erectile dysfunction) 09/23/2017  . Elevated ferritin 2013   479 -->  return to normal  . Encounter for long-term (current) use of other medications 09/23/2017  . Erectile dysfunction 2013   Had been on Cialis  . GERD (gastroesophageal reflux disease) 2013  . Hyperlipidemia LDL goal <130   . Irregular heart rate 09/23/2017  . Neuropathy 09/23/2017  . On statin  therapy 04/15/2017  . Osteopenia 2013   prior h/o of Chronic steroids for 10 years; was on Boniva for 6 months. Now takes calcium/vitamin D daily.  . Other and unspecified hyperlipidemia 09/23/2017  . Overweight (BMI 25.0-29.9) 11/22/2016  . Peripheral neuropathy 2009   Did not respond to gabapentin by prior records  . Prediabetes   . RA (rheumatoid arthritis) ([place]) 09/23/2017  . Rheumatoid arthritis ([place]) 1998   had been on chronic steroids for about 10 years  . Rheumatoid lung ([place])    pt reports having thoracentesis for rheumatoid lung many years ago.   . Thrombocytopenia ([place]) 2013   Mildly low at 130  . Type 2 diabetes mellitus without complication, without long-term current use of insulin ([place]) 11/22/2016  . Vitamin D deficiency 11/22/2016  . Wears glasses    [facility]   Past Surgical History:  Procedure Laterality Date  . NO PAST SURGERIES       Current Outpatient Medications  Medication Sig Dispense Refill  . amiodarone (PACERONE) 200 MG tablet Take 1 tablet (200 mg total) twice a day for 2 weeks, then reduce and take 1 tablet (200 mg total) once daily 90 tablet 1  . apixaban (ELIQUIS) 5 MG TABS tablet Take 1 tablet (5 mg total) by mouth 2 (two) times daily. 180 tablet 3  . b complex vitamins capsule Take 1 capsule by mouth daily.    . blood glucose meter kit and supplies Dispense based on patient and insurance  Check blood sugar once daily. 1 each  0  . calcium-vitamin D (OSCAL WITH D) 500-200 MG-UNIT tablet Take 1 tablet by mouth daily with breakfast.     . folic acid (FOLVITE) 1 MG tablet Take 1 tablet by mouth daily.    . Golimumab (SIMPONI) 50 MG/0.5ML SOSY Inject 50 Units into the skin every 30 (thirty) days.    . meloxicam (MOBIC) 7.5 MG tablet Take 2 tablets by mouth daily.    . metFORMIN (GLUCOPHAGE) 500 MG tablet Take 1 tablet (500 mg total) by mouth 2 (two) times daily with a meal. 180 tablet 1  . methotrexate (RHEUMATREX) 2.5 MG tablet Take 4 tablets by mouth  once a week.    . Multiple Vitamin (MULTIVITAMIN) capsule Take 1 capsule by mouth daily. Takes womens for extra calcium    . omeprazole (PRILOSEC) 20 MG capsule Take 1 capsule (20 mg total) by mouth daily. 90 capsule 3  . rosuvastatin (CRESTOR) 5 MG tablet 3 times week 45 tablet 3   No current facility-administered medications for this visit.     Allergies:   Penicillins and Sulfa antibiotics   Social History:  The patient  reports that he quit smoking about 20 years ago. His smoking use included cigarettes. He has a 15.00 pack-year smoking history. He has never used smokeless tobacco. He reports that he drinks about 2.4 oz of alcohol per week. He reports that he does not use drugs.   Family History:  The patient's family history includes Alcohol abuse in his brother; Arthritis in his mother and sister; Breast cancer in his paternal aunt; Diabetes in his father and sister; Drug abuse in his brother; Early death in his maternal grandfather; Heart disease in his father.   ROS:  Please see the history of present illness.   Otherwise, review of systems is positive for palpitations.   All other systems are reviewed and negative.   PHYSICAL EXAM: VS:  BP 130/80   Pulse (!) 55   Ht 6' (1.829 m)   Wt 206 lb 6.4 oz (93.6 kg)   SpO2 98%   BMI 27.99 kg/m  , BMI Body mass index is 27.99 kg/m. GEN: Well nourished, well developed, in no acute distress  HEENT: normal  Neck: no JVD, carotid bruits, or masses Cardiac: RRR; no murmurs, rubs, or gallops,no edema  Respiratory:  clear to auscultation bilaterally, normal work of breathing GI: soft, nontender, nondistended, + BS MS: no deformity or atrophy  Skin: warm and dry Neuro:  Strength and sensation are intact Psych: euthymic mood, full affect  EKG:  EKG is not ordered today. Personal review of the ekg ordered 12/24/17 shows AF, rate 130   Recent Labs: 09/26/2017: Magnesium 2.0 12/05/2017: ALT 24; BUN 20; Creatinine, Ser 0.95; Hemoglobin 15.2;  Platelets 141.0; Potassium 4.5; Sodium 139; TSH 1.67    Lipid Panel     Component Value Date/Time   CHOL 173 12/05/2017 0910   TRIG 135.0 12/05/2017 0910   HDL 43.40 12/05/2017 0910   CHOLHDL 4 12/05/2017 0910   VLDL 27.0 12/05/2017 0910   LDLCALC 103 (H) 12/05/2017 0910     Wt Readings from Last 3 Encounters:  01/29/18 206 lb 6.4 oz (93.6 kg)  12/24/17 205 lb (93 kg)  12/18/17 210 lb (95.3 kg)      Other studies Reviewed: Additional studies/ records that were reviewed today include: TTE 10/25/17  Review of the above records today demonstrates:  - Left ventricle: The cavity size was normal. Wall thickness was   normal. Systolic function  was normal. The estimated ejection   fraction was in the range of 55% to 60%. Wall motion was normal;   there were no regional wall motion abnormalities. Features are   consistent with a pseudonormal left ventricular filling pattern,   with concomitant abnormal relaxation and increased filling   pressure (grade 2 diastolic dysfunction). - Ascending aorta: The ascending aorta was moderately dilated. - Mitral valve: There was mild regurgitation. - Left atrium: The atrium was mildly dilated. - Right ventricle: The cavity size was moderately dilated. Wall   thickness was normal. - Right atrium: The atrium was moderately dilated.   ASSESSMENT AND PLAN:  1.  Paroxysmal atrial fibrillation: On eliquis and amiodarone. Plan for AF ablation on 02/07/18.  He is in normal rhythm today and the amiodarone is greatly improved his symptoms.  We did discuss the ablation.  Risks and benefits were discussed and include bleeding, tamponade, heart block, stroke, damage to surrounding organs.  He understands these risks and is agreed to the procedure.  This patients CHA2DS2-VASc Score and unadjusted Ischemic Stroke Rate (% per year) is equal to 0.6 % stroke rate/year from a score of 1  Above score calculated as 1 point each if present [CHF, HTN, DM,  Vascular=MI/PAD/Aortic Plaque, Age if 65-74, or Male] Above score calculated as 2 points each if present [Age > 75, or Stroke/TIA/TE]   2. Hyperlipidemia: Continue Crestor    Current medicines are reviewed at length with the patient today.   The patient does not have concerns regarding his medicines.  The following changes were made today: None  Labs/ tests ordered today include:  Orders Placed This Encounter  Procedures  . Basic Metabolic Panel (BMET)  . CBC    Disposition:   FU with Bruce Kelly 1 months  Signed, Bruce Zane Meredith Leeds, MD  01/29/2018 4:19 PM     [address] [place] [phone] (office) [phone] (fax)

## 2018-01-29 NOTE — Patient Instructions (Addendum)
Medication Instructions:  Your physician recommends that you continue on your current medications as directed. Please refer to the Current Medication list given to you today.  Labwork: Pre procedure lab work today: BMET & CBC  Testing/Procedures: Your physician has recommended that you have an ablation. Catheter ablation is a medical procedure used to treat some cardiac arrhythmias (irregular heartbeats). During catheter ablation, a long, thin, flexible tube is put into a blood vessel in your groin (upper thigh), or neck. This tube is called an ablation catheter. It is then guided to your heart through the blood vessel. Radio frequency waves destroy small areas of heart tissue where abnormal heartbeats may cause an arrhythmia to start. Please see the instructions below.              Instructions for your ablation: 1. Please arrive at the [facility], Main Entrance "A", of [facility] at 5:30 a.m. on 02/07/2018. 2. Do not eat or drink after midnight the night prior to the procedure. 3. Do not miss any doses of ELIQUIS prior to the morning of the procedure.  4. Do not take any medications the morning of the procedure. 5. You chest will need to be shaved for this procedure (if needed). We ask that you shave these areas yourself at home 1-2 days prior to the procedure. If you are uncomfortable/unable, then hospital staff will shave these areas the morning of your procedure. 6. Plan for an overnight stay in the hospital. 7. You will need someone to drive you home at discharge.   Follow-Up: Keep your follow up on 03/12/2018 @ 3:30 pm with Dr. Curt Bears in the [facility] office.  Keep your follow up on 05/19/2018 @ 3:30 pm with Dr. Curt Bears in the [facility] office.   * If you need a refill on your cardiac medications before your next appointment, please call your pharmacy.   *Please note that any paperwork needing to be filled out by the provider will need to be addressed at the front  desk prior to seeing the provider. Please note that any FMLA, disability or other documents regarding health condition is subject to a $25.00 charge that must be received prior to completion of paperwork in the form of a money order or check.  Thank you for choosing [facility]!!   Trinidad Curet, RN [phone]  Any Other Special Instructions Will Be Listed Below (If Applicable).  CT INSTRUCTIONS Please arrive at the [facility] main entrance of [facility] at _______ AM (30-45 minutes prior to test start time)  [facility] [place], [place] [id number] [phone]  Proceed to the [facility] Radiology Department (First Floor).  Please follow these instructions carefully (unless otherwise directed):  Hold all erectile dysfunction medications at least 48 hours prior to test.  On the Night Before the Test:  Drink plenty of water.  Do not consume any caffeinated/decaffeinated beverages or chocolate 12 hours prior to your test.  Do not take any antihistamines 12 hours prior to your test.  If you take Metformin do not take 24 hours prior to test.  On the Day of the Test:  Drink plenty of water. Do not drink any water within one hour of the test.  Do not eat any food 4 hours prior to the test.  You may take your regular medications prior to the test.  IF NOT ON A BETA BLOCKER - Take 50 mg of lopressor (metoprolol) one hour before the  test.  HOLD Furosemide morning of the test.  After the Test:  Drink plenty of water.  After receiving IV contrast, you may experience a mild flushed feeling. This is normal.  On occasion, you may experience a mild rash up to 24 hours after the test. This is not dangerous. If this occurs, you can take Benadryl 25 mg and increase your fluid intake.  If you experience trouble breathing, this can be serious. If it is severe call 911 IMMEDIATELY. If it is mild, please call our office.  If you  take any of these medications: Glipizide/Metformin, Avandament, Glucavance, please do not take 48 hours after completing test.    Cardiac Ablation Cardiac ablation is a procedure to disable (ablate) a small amount of heart tissue in very specific places. The heart has many electrical connections. Sometimes these connections are abnormal and can cause the heart to beat very fast or irregularly. Ablating some of the problem areas can improve the heart rhythm or return it to normal. Ablation may be done for people who:  Have Wolff-Parkinson-White syndrome.  Have fast heart rhythms (tachycardia).  Have taken medicines for an abnormal heart rhythm (arrhythmia) that were not effective or caused side effects.  Have a high-risk heartbeat that may be life-threatening.   During the procedure, a small incision is made in the neck or the groin, and a long, thin, flexible tube (catheter) is inserted into the incision and moved to the heart. Small devices (electrodes) on the tip of the catheter will send out electrical currents. A type of X-ray (fluoroscopy) will be used to help guide the catheter and to provide images of the heart. Tell a health care provider about:  Any allergies you have.  All medicines you are taking, including vitamins, herbs, eye drops, creams, and over-the-counter medicines.  Any problems you or family members have had with anesthetic medicines.  Any blood disorders you have.  Any surgeries you have had.  Any medical conditions you have, such as kidney failure.  Whether you are pregnant or may be pregnant. What are the risks? Generally, this is a safe procedure. However, problems may occur, including:  Infection.  Bruising and bleeding at the catheter insertion site.  Bleeding into the chest, especially into the sac that surrounds the heart. This is a serious complication.  Stroke or blood clots.  Damage to other structures or organs.  Allergic reaction to  medicines or dyes.  Need for a permanent pacemaker if the normal electrical system is damaged. A pacemaker is a small computer that sends electrical signals to the heart and helps your heart beat normally.  The procedure not being fully effective. This may not be recognized until months later. Repeat ablation procedures are sometimes required.  What happens before the procedure?  Follow instructions from your health care provider about eating or drinking restrictions.  Ask your health care provider about: ? Changing or stopping your regular medicines. This is especially important if you are taking diabetes medicines or blood thinners. ? Taking medicines such as aspirin and ibuprofen. These medicines can thin your blood. Do not take these medicines before your procedure if your health care provider instructs you not to.  Plan to have someone take you home from the hospital or clinic.  If you will be going home right after the procedure, plan to have someone with you for 24 hours. What happens during the procedure?  To lower your risk of infection: ? Your health care team will wash or  sanitize their hands. ? Your skin will be washed with soap. ? Hair may be removed from the incision area.  An IV tube will be inserted into one of your veins.  You will be given a medicine to help you relax (sedative).  The skin on your neck or groin will be numbed.  An incision will be made in your neck or your groin.  A needle will be inserted through the incision and into a large vein in your neck or groin.  A catheter will be inserted into the needle and moved to your heart.  Dye may be injected through the catheter to help your surgeon see the area of the heart that needs treatment.  Electrical currents will be sent from the catheter to ablate heart tissue in desired areas. There are three types of energy that may be used to ablate heart tissue: ? Heat (radiofrequency energy). ? Laser  energy. ? Extreme cold (cryoablation).  When the necessary tissue has been ablated, the catheter will be removed.  Pressure will be held on the catheter insertion area to prevent excessive bleeding.  A bandage (dressing) will be placed over the catheter insertion area. The procedure may vary among health care providers and hospitals. What happens after the procedure?  Your blood pressure, heart rate, breathing rate, and blood oxygen level will be monitored until the medicines you were given have worn off.  Your catheter insertion area will be monitored for bleeding. You will need to lie still for a few hours to ensure that you do not bleed from the catheter insertion area.  Do not drive for 24 hours or as long as directed by your health care provider. Summary  Cardiac ablation is a procedure to disable (ablate) a small amount of heart tissue in very specific places. Ablating some of the problem areas can improve the heart rhythm or return it to normal.  During the procedure, electrical currents will be sent from the catheter to ablate heart tissue in desired areas. This information is not intended to replace advice given to you by your health care provider. Make sure you discuss any questions you have with your health care provider. Document Released: 11/11/2008 Document Revised: 05/14/2016 Document Reviewed: 05/14/2016 Elsevier Interactive Patient Education  2018 [employer].   Amiodarone tablets What is this medicine? AMIODARONE (a MEE oh da rone) is an antiarrhythmic drug. It helps make your heart beat regularly. Because of the side effects caused by this medicine, it is only used when other medicines have not worked. It is usually used for heartbeat problems that may be life threatening. This medicine may be used for other purposes; ask your health care provider or pharmacist if you have questions. COMMON BRAND NAME(S): Cordarone, Pacerone What should I tell my health care  provider before I take this medicine? They need to know if you have any of these conditions: -liver disease -lung disease -other heart problems -thyroid disease -an unusual or allergic reaction to amiodarone, iodine, other medicines, foods, dyes, or preservatives -pregnant or trying to get pregnant -breast-feeding How should I use this medicine? Take this medicine by mouth with a glass of water. Follow the directions on the prescription label. You can take this medicine with or without food. However, you should always take it the same way each time. Take your doses at regular intervals. Do not take your medicine more often than directed. Do not stop taking except on the advice of your doctor or health care professional. A  special MedGuide will be given to you by the pharmacist with each prescription and refill. Be sure to read this information carefully each time. Talk to your pediatrician regarding the use of this medicine in children. Special care may be needed. Overdosage: If you think you have taken too much of this medicine contact a poison control center or emergency room at once. NOTE: This medicine is only for you. Do not share this medicine with others. What if I miss a dose? If you miss a dose, take it as soon as you can. If it is almost time for your next dose, take only that dose. Do not take double or extra doses. What may interact with this medicine? Do not take this medicine with any of the following medications: -abarelix -apomorphine -arsenic trioxide -certain antibiotics like erythromycin, gemifloxacin, levofloxacin, pentamidine -certain medicines for depression like amoxapine, tricyclic antidepressants -certain medicines for fungal infections like fluconazole, itraconazole, ketoconazole, posaconazole, voriconazole -certain medicines for irregular heart beat like disopyramide, dofetilide, dronedarone, ibutilide, propafenone, sotalol -certain medicines for malaria like  chloroquine, halofantrine -cisapride -droperidol -haloperidol -hawthorn -maprotiline -methadone -phenothiazines like chlorpromazine, mesoridazine, thioridazine -pimozide -ranolazine -red yeast rice -vardenafil -ziprasidone This medicine may also interact with the following medications: -antiviral medicines for HIV or AIDS -certain medicines for blood pressure, heart disease, irregular heart beat -certain medicines for cholesterol like atorvastatin, cerivastatin, lovastatin, simvastatin -certain medicines for hepatitis C like sofosbuvir and ledipasvir; sofosbuvir -certain medicines for seizures like phenytoin -certain medicines for thyroid problems -certain medicines that treat or prevent blood clots like warfarin -cholestyramine -cimetidine -clopidogrel -cyclosporine -dextromethorphan -diuretics -fentanyl -general anesthetics -grapefruit juice -lidocaine -loratadine -methotrexate -other medicines that prolong the QT interval (cause an abnormal heart rhythm) -procainamide -quinidine -rifabutin, rifampin, or rifapentine -St. John's Wort -trazodone This list may not describe all possible interactions. Give your health care provider a list of all the medicines, herbs, non-prescription drugs, or dietary supplements you use. Also tell them if you smoke, drink alcohol, or use illegal drugs. Some items may interact with your medicine. What should I watch for while using this medicine? Your condition will be monitored closely when you first begin therapy. Often, this drug is first started in a hospital or other monitored health care setting. Once you are on maintenance therapy, visit your doctor or health care professional for regular checks on your progress. Because your condition and use of this medicine carry some risk, it is a good idea to carry an identification card, necklace or bracelet with details of your condition, medications, and doctor or health care professional. Bruce Kelly  may get drowsy or dizzy. Do not drive, use machinery, or do anything that needs mental alertness until you know how this medicine affects you. Do not stand or sit up quickly, especially if you are an older patient. This reduces the risk of dizzy or fainting spells. This medicine can make you more sensitive to the sun. Keep out of the sun. If you cannot avoid being in the sun, wear protective clothing and use sunscreen. Do not use sun lamps or tanning beds/booths. You should have regular eye exams before and during treatment. Call your doctor if you have blurred vision, see halos, or your eyes become sensitive to light. Your eyes may get dry. It may be helpful to use a lubricating eye solution or artificial tears solution. If you are going to have surgery or a procedure that requires contrast dyes, tell your doctor or health care professional that you are taking this medicine. What side  effects may I notice from receiving this medicine? Side effects that you should report to your doctor or health care professional as soon as possible: -allergic reactions like skin rash, itching or hives, swelling of the face, lips, or tongue -blue-gray coloring of the skin -blurred vision, seeing blue green halos, increased sensitivity of the eyes to light -breathing problems -chest pain -dark urine -fast, irregular heartbeat -feeling faint or light-headed -intolerance to heat or cold -nausea or vomiting -pain and swelling of the scrotum -pain, tingling, numbness in feet, hands -redness, blistering, peeling or loosening of the skin, including inside the mouth -spitting up blood -stomach pain -sweating -unusual or uncontrolled movements of body -unusually weak or tired -weight gain or loss -yellowing of the eyes or skin Side effects that usually do not require medical attention (report to your doctor or health care professional if they continue or are bothersome): -change in sex drive or  performance -constipation -dizziness -headache -loss of appetite -trouble sleeping This list may not describe all possible side effects. Call your doctor for medical advice about side effects. You may report side effects to FDA at 1-800-FDA-1088. Where should I keep my medicine? Keep out of the reach of children. Store at room temperature between 20 and 25 degrees C (68 and 77 degrees F). Protect from light. Keep container tightly closed. Throw away any unused medicine after the expiration date. NOTE: This sheet is a summary. It may not cover all possible information. If you have questions about this medicine, talk to your doctor, pharmacist, or health care provider.  2018 Elsevier/Gold Standard (2013-09-28 19:48:11)

## 2018-01-30 LAB — CBC
Hematocrit: 46 % (ref 37.5–51.0)
Hemoglobin: 15.6 g/dL (ref 13.0–17.7)
MCH: 31.1 pg (ref 26.6–33.0)
MCHC: 33.9 g/dL (ref 31.5–35.7)
MCV: 92 fL (ref 79–97)
Platelets: 148 10*3/uL — ABNORMAL LOW (ref 150–450)
RBC: 5.02 x10E6/uL (ref 4.14–5.80)
RDW: 14.1 % (ref 12.3–15.4)
WBC: 5.8 10*3/uL (ref 3.4–10.8)

## 2018-01-30 LAB — BASIC METABOLIC PANEL
BUN / CREAT RATIO: 18 (ref 9–20)
BUN: 18 mg/dL (ref 6–24)
CO2: 25 mmol/L (ref 20–29)
Calcium: 9.2 mg/dL (ref 8.7–10.2)
Chloride: 99 mmol/L (ref 96–106)
Creatinine, Ser: 1.01 mg/dL (ref 0.76–1.27)
GFR, EST AFRICAN AMERICAN: 94 mL/min/{1.73_m2} (ref >59)
GFR, EST NON AFRICAN AMERICAN: 82 mL/min/{1.73_m2} (ref >59)
Glucose: 244 mg/dL — ABNORMAL HIGH (ref 65–99)
Potassium: 4.2 mmol/L (ref 3.5–5.2)
Sodium: 138 mmol/L (ref 134–144)

## 2018-02-05 ENCOUNTER — Ambulatory Visit (HOSPITAL_COMMUNITY): Payer: 59

## 2018-02-05 ENCOUNTER — Ambulatory Visit (HOSPITAL_COMMUNITY)
Admission: RE | Admit: 2018-02-05 | Discharge: 2018-02-05 | Disposition: A | Discharge status: Home / Self Care | Payer: 59 | Source: Ambulatory Visit | Attending: Cardiology | Admitting: Cardiology

## 2018-02-05 DIAGNOSIS — I480 Paroxysmal atrial fibrillation: Secondary | ICD-10-CM | POA: Diagnosis not present

## 2018-02-05 DIAGNOSIS — I4891 Unspecified atrial fibrillation: Secondary | ICD-10-CM

## 2018-02-05 MED ORDER — IOPAMIDOL (ISOVUE-370) INJECTION 76%
INTRAVENOUS | Status: AC
  Administered 2018-02-05: 80 mL
  Filled 2018-02-05: qty 100

## 2018-02-07 ENCOUNTER — Ambulatory Visit (HOSPITAL_COMMUNITY)
Admission: RE | Disposition: A | Discharge status: Home / Self Care | Payer: Self-pay | Source: Ambulatory Visit | Attending: Cardiology

## 2018-02-07 ENCOUNTER — Ambulatory Visit (HOSPITAL_COMMUNITY): Payer: 59 | Admitting: Certified Registered Nurse Anesthetist

## 2018-02-07 ENCOUNTER — Other Ambulatory Visit: Payer: Self-pay

## 2018-02-07 ENCOUNTER — Encounter (HOSPITAL_COMMUNITY): Payer: Self-pay | Admitting: Anesthesiology

## 2018-02-07 ENCOUNTER — Ambulatory Visit (HOSPITAL_COMMUNITY)
Admission: RE | Admit: 2018-02-07 | Discharge: 2018-02-07 | Disposition: A | Discharge status: Home / Self Care | Payer: 59 | Source: Ambulatory Visit | Attending: Cardiology | Admitting: Cardiology

## 2018-02-07 DIAGNOSIS — E1142 Type 2 diabetes mellitus with diabetic polyneuropathy: Secondary | ICD-10-CM | POA: Insufficient documentation

## 2018-02-07 DIAGNOSIS — Z79899 Other long term (current) drug therapy: Secondary | ICD-10-CM | POA: Diagnosis not present

## 2018-02-07 DIAGNOSIS — E785 Hyperlipidemia, unspecified: Secondary | ICD-10-CM | POA: Insufficient documentation

## 2018-02-07 DIAGNOSIS — M069 Rheumatoid arthritis, unspecified: Secondary | ICD-10-CM | POA: Insufficient documentation

## 2018-02-07 DIAGNOSIS — K219 Gastro-esophageal reflux disease without esophagitis: Secondary | ICD-10-CM | POA: Insufficient documentation

## 2018-02-07 DIAGNOSIS — Z9889 Other specified postprocedural states: Secondary | ICD-10-CM | POA: Insufficient documentation

## 2018-02-07 DIAGNOSIS — I480 Paroxysmal atrial fibrillation: Secondary | ICD-10-CM | POA: Diagnosis present

## 2018-02-07 DIAGNOSIS — N529 Male erectile dysfunction, unspecified: Secondary | ICD-10-CM | POA: Insufficient documentation

## 2018-02-07 DIAGNOSIS — Z86010 Personal history of colonic polyps: Secondary | ICD-10-CM | POA: Insufficient documentation

## 2018-02-07 DIAGNOSIS — E559 Vitamin D deficiency, unspecified: Secondary | ICD-10-CM | POA: Diagnosis not present

## 2018-02-07 DIAGNOSIS — Z87891 Personal history of nicotine dependence: Secondary | ICD-10-CM | POA: Diagnosis not present

## 2018-02-07 DIAGNOSIS — Z7984 Long term (current) use of oral hypoglycemic drugs: Secondary | ICD-10-CM | POA: Insufficient documentation

## 2018-02-07 DIAGNOSIS — E119 Type 2 diabetes mellitus without complications: Secondary | ICD-10-CM | POA: Diagnosis not present

## 2018-02-07 DIAGNOSIS — I4891 Unspecified atrial fibrillation: Secondary | ICD-10-CM | POA: Diagnosis not present

## 2018-02-07 HISTORY — PX: ABLATION OF DYSRHYTHMIC FOCUS: SHX254

## 2018-02-07 HISTORY — PX: ATRIAL FIBRILLATION ABLATION: EP1191

## 2018-02-07 LAB — GLUCOSE, CAPILLARY
Glucose-Capillary: 150 mg/dL — ABNORMAL HIGH (ref 70–99)
Glucose-Capillary: 112 mg/dL — ABNORMAL HIGH (ref 70–99)
Glucose-Capillary: 144 mg/dL — ABNORMAL HIGH (ref 70–99)

## 2018-02-07 LAB — POCT ACTIVATED CLOTTING TIME
ACTIVATED CLOTTING TIME: 296 s
Activated Clotting Time: 329 seconds
Activated Clotting Time: 186 seconds

## 2018-02-07 SURGERY — ATRIAL FIBRILLATION ABLATION
Anesthesia: General

## 2018-02-07 MED ORDER — SODIUM CHLORIDE 0.9 % IV SOLN
INTRAVENOUS | Status: DC
  Administered 2018-02-07 (×2): via INTRAVENOUS

## 2018-02-07 MED ORDER — BUPIVACAINE HCL (PF) 0.25 % IJ SOLN
INTRAMUSCULAR | Status: AC
  Filled 2018-02-07: qty 30

## 2018-02-07 MED ORDER — METHOTREXATE 2.5 MG PO TABS
10.0000 mg | ORAL_TABLET | ORAL | Status: DC
  Filled 2018-02-07: qty 4

## 2018-02-07 MED ORDER — MIDAZOLAM HCL 2 MG/2ML IJ SOLN
INTRAMUSCULAR | Status: DC | PRN
  Administered 2018-02-07: 2 mg via INTRAVENOUS

## 2018-02-07 MED ORDER — HEPARIN (PORCINE) IN NACL 1000-0.9 UT/500ML-% IV SOLN
INTRAVENOUS | Status: AC
  Filled 2018-02-07: qty 500

## 2018-02-07 MED ORDER — OFF THE BEAT BOOK
Freq: Once | Status: DC
  Filled 2018-02-07: qty 1

## 2018-02-07 MED ORDER — FENTANYL CITRATE (PF) 100 MCG/2ML IJ SOLN
INTRAMUSCULAR | Status: DC | PRN
  Administered 2018-02-07 (×2): 50 ug via INTRAVENOUS

## 2018-02-07 MED ORDER — AMIODARONE HCL 200 MG PO TABS: 200.0000 mg | ORAL_TABLET | Freq: Every day | ORAL | Status: DC

## 2018-02-07 MED ORDER — ACETAMINOPHEN 325 MG PO TABS: 650.0000 mg | ORAL_TABLET | ORAL | Status: DC | PRN

## 2018-02-07 MED ORDER — INSULIN ASPART 100 UNIT/ML ~~LOC~~ SOLN: 0.0000 [IU] | Freq: Three times a day (TID) | SUBCUTANEOUS | Status: DC

## 2018-02-07 MED ORDER — GOLIMUMAB 50 MG/0.5ML ~~LOC~~ SOSY: 50.0000 [IU] | PREFILLED_SYRINGE | SUBCUTANEOUS | Status: DC

## 2018-02-07 MED ORDER — HEPARIN (PORCINE) IN NACL 1000-0.9 UT/500ML-% IV SOLN
INTRAVENOUS | Status: DC | PRN
  Administered 2018-02-07 (×5): 500 mL

## 2018-02-07 MED ORDER — BUPIVACAINE HCL (PF) 0.25 % IJ SOLN
INTRAMUSCULAR | Status: DC | PRN
  Administered 2018-02-07: 30 mL

## 2018-02-07 MED ORDER — SODIUM CHLORIDE 0.9% FLUSH: 3.0000 mL | INTRAVENOUS | Status: DC | PRN

## 2018-02-07 MED ORDER — ONDANSETRON HCL 4 MG/2ML IJ SOLN
INTRAMUSCULAR | Status: DC | PRN
  Administered 2018-02-07: 4 mg via INTRAVENOUS

## 2018-02-07 MED ORDER — HEPARIN SODIUM (PORCINE) 1000 UNIT/ML IJ SOLN
INTRAMUSCULAR | Status: DC | PRN
  Administered 2018-02-07: 3000 [IU] via INTRAVENOUS
  Administered 2018-02-07: 2000 [IU] via INTRAVENOUS
  Administered 2018-02-07: 14000 [IU] via INTRAVENOUS

## 2018-02-07 MED ORDER — PROPOFOL 10 MG/ML IV BOLUS
INTRAVENOUS | Status: DC | PRN
  Administered 2018-02-07: 150 mg via INTRAVENOUS

## 2018-02-07 MED ORDER — DOBUTAMINE IN D5W 4-5 MG/ML-% IV SOLN
INTRAVENOUS | Status: AC
  Filled 2018-02-07: qty 250

## 2018-02-07 MED ORDER — HEPARIN SODIUM (PORCINE) 1000 UNIT/ML IJ SOLN
INTRAMUSCULAR | Status: AC
  Filled 2018-02-07: qty 1

## 2018-02-07 MED ORDER — ROCURONIUM BROMIDE 10 MG/ML (PF) SYRINGE
PREFILLED_SYRINGE | INTRAVENOUS | Status: DC | PRN
  Administered 2018-02-07: 50 mg via INTRAVENOUS

## 2018-02-07 MED ORDER — ONDANSETRON HCL 4 MG/2ML IJ SOLN: 4.0000 mg | Freq: Four times a day (QID) | INTRAMUSCULAR | Status: DC | PRN

## 2018-02-07 MED ORDER — SODIUM CHLORIDE 0.9 % IV SOLN: 250.0000 mL | INTRAVENOUS | Status: DC | PRN

## 2018-02-07 MED ORDER — SODIUM CHLORIDE 0.9% FLUSH: 3.0000 mL | Freq: Two times a day (BID) | INTRAVENOUS | Status: DC

## 2018-02-07 MED ORDER — SODIUM CHLORIDE 0.9 % IV SOLN
INTRAVENOUS | Status: DC | PRN
  Administered 2018-02-07: 25 ug/min via INTRAVENOUS

## 2018-02-07 MED ORDER — SUGAMMADEX SODIUM 200 MG/2ML IV SOLN
INTRAVENOUS | Status: DC | PRN
  Administered 2018-02-07: 180 mg via INTRAVENOUS

## 2018-02-07 MED ORDER — FOLIC ACID 1 MG PO TABS: 1.0000 mg | ORAL_TABLET | Freq: Every day | ORAL | Status: DC

## 2018-02-07 MED ORDER — PANTOPRAZOLE SODIUM 40 MG PO TBEC: 40.0000 mg | DELAYED_RELEASE_TABLET | Freq: Every day | ORAL | Status: DC

## 2018-02-07 MED ORDER — CALCIUM CARBONATE-VITAMIN D 500-200 MG-UNIT PO TABS: 1.0000 | ORAL_TABLET | Freq: Every day | ORAL | Status: DC

## 2018-02-07 MED ORDER — HEPARIN SODIUM (PORCINE) 1000 UNIT/ML IJ SOLN
INTRAMUSCULAR | Status: DC | PRN
  Administered 2018-02-07: 1000 [IU] via INTRAVENOUS

## 2018-02-07 MED ORDER — DOBUTAMINE IN D5W 4-5 MG/ML-% IV SOLN
INTRAVENOUS | Status: DC | PRN
  Administered 2018-02-07: 20 ug/kg/min via INTRAVENOUS

## 2018-02-07 MED ORDER — B COMPLEX-C PO TABS
1.0000 | ORAL_TABLET | Freq: Every day | ORAL | Status: DC
  Filled 2018-02-07: qty 1

## 2018-02-07 MED ORDER — MELOXICAM 7.5 MG PO TABS
15.0000 mg | ORAL_TABLET | Freq: Every day | ORAL | Status: DC
  Filled 2018-02-07: qty 2

## 2018-02-07 MED ORDER — ADULT MULTIVITAMIN W/MINERALS CH: 1.0000 | ORAL_TABLET | Freq: Every day | ORAL | Status: DC

## 2018-02-07 MED ORDER — APIXABAN 5 MG PO TABS: 5.0000 mg | ORAL_TABLET | Freq: Two times a day (BID) | ORAL | Status: DC

## 2018-02-07 MED ORDER — LIDOCAINE HCL (CARDIAC) PF 100 MG/5ML IV SOSY
PREFILLED_SYRINGE | INTRAVENOUS | Status: DC | PRN
  Administered 2018-02-07: 100 mg via INTRAVENOUS

## 2018-02-07 MED ORDER — PROTAMINE SULFATE 10 MG/ML IV SOLN
INTRAVENOUS | Status: DC | PRN
  Administered 2018-02-07: 40 mg via INTRAVENOUS

## 2018-02-07 SURGICAL SUPPLY — 18 items
BAG SNAP BAND KOVER 36X36 (MISCELLANEOUS) ×3 IMPLANT
CATH MAPPNG PENTARAY F 2-6-2MM (CATHETERS) ×1 IMPLANT
CATH SMTCH THERMOCOOL SF DF (CATHETERS) ×3 IMPLANT
CATH SOUNDSTAR 3D IMAGING (CATHETERS) ×3 IMPLANT
CATH WEBSTER BI DIR CS D-F CRV (CATHETERS) ×3 IMPLANT
PACK EP LATEX FREE (CUSTOM PROCEDURE TRAY) ×2
PACK EP LF (CUSTOM PROCEDURE TRAY) ×1 IMPLANT
PAD DEFIB LIFELINK (PAD) ×3 IMPLANT
PATCH CARTO3 (PAD) ×3 IMPLANT
PENTARAY F 2-6-2MM (CATHETERS) ×3
SHEATH AVANTI 11F 11CM (SHEATH) ×3 IMPLANT
SHEATH BAYLIS SUREFLEX  M 8.5 (SHEATH) ×4
SHEATH BAYLIS SUREFLEX M 8.5 (SHEATH) ×2 IMPLANT
SHEATH BAYLIS TRANSSEPTAL 98CM (NEEDLE) ×3 IMPLANT
SHEATH PINNACLE 7F 10CM (SHEATH) ×3 IMPLANT
SHEATH PINNACLE 8F 10CM (SHEATH) ×6 IMPLANT
SHEATH PINNACLE 9F 10CM (SHEATH) ×6 IMPLANT
TUBING SMART ABLATE COOLFLOW (TUBING) ×3 IMPLANT

## 2018-02-07 NOTE — Progress Notes (Signed)
Site area: 2 right groin fv sheaths Site Prior to Removal:  Level 0 Pressure Applied For: 20 minutes Manual:   yes Patient Status During Pull:  stable Post Pull Site:  Level 0 Post Pull Instructions Given:  yes Post Pull Pulses Present: rt dp palpable Dressing Applied:  Gauze and tegaderm Bedrest begins @  Comments:

## 2018-02-07 NOTE — Progress Notes (Signed)
Site area: left groin fv sheaths x2 pulled and pressure held by Caren Griffins Site Prior to Removal:  Level 0 Pressure Applied For: 25 minutes Manual:   yes Patient Status During Pull:  stable Post Pull Site:  Level 0 Post Pull Instructions Given:  yes Post Pull Pulses Present: left dp palpable Dressing Applied:  Gauze and tegaderm Bedrest begins @ 1110 Comments: IV saline locked

## 2018-02-07 NOTE — Anesthesia Preprocedure Evaluation (Signed)
Anesthesia Evaluation  Patient identified by MRN, date of birth, ID band Patient awake    Reviewed: Allergy & Precautions, NPO status , Patient's Chart, lab work & pertinent test results  Airway Mallampati: I       Dental  (+) Teeth Intact   Pulmonary former smoker,    Pulmonary exam normal        Cardiovascular Normal cardiovascular exam Rhythm:Regular Rate:Normal     Neuro/Psych    GI/Hepatic GERD  Medicated,  Endo/Other  diabetes, Type 2, Oral Hypoglycemic Agents  Renal/GU      Musculoskeletal   Abdominal Normal abdominal exam  (+)   Peds  Hematology   Anesthesia Other Findings   Reproductive/Obstetrics                             Anesthesia Physical Anesthesia Plan  ASA: II  Anesthesia Plan: General   Post-op Pain Management:    Induction: Intravenous  PONV Risk Score and Plan:   Airway Management Planned: LMA  Additional Equipment:   Intra-op Plan:   Post-operative Plan:   Informed Consent: I have reviewed the patients History and Physical, chart, labs and discussed the procedure including the risks, benefits and alternatives for the proposed anesthesia with the patient or authorized representative who has indicated his/her understanding and acceptance.   Dental advisory given  Plan Discussed with: CRNA  Anesthesia Plan Comments:         Anesthesia Quick Evaluation

## 2018-02-07 NOTE — H&P (Signed)
Bruce Kelly has presented today for surgery, with the diagnosis of atrial fibrillation.  The various methods of treatment have been discussed with the patient and family. After consideration of risks, benefits and other options for treatment, the patient has consented to  Procedure(s): Catheter ablation as a surgical intervention .  Risks include but not limited to bleeding, tamponade, heart block, stroke, damage to surrounding organs, among others. The patient's history has been reviewed, patient examined, no change in status, stable for surgery.  I have reviewed the patient's chart and labs.  Questions were answered to the patient's satisfaction.    Will Curt Bears, MD 02/07/2018 7:07 AM

## 2018-02-07 NOTE — Anesthesia Postprocedure Evaluation (Signed)
Anesthesia Post Note  Patient: Bruce Kelly  Procedure(s) Performed: ATRIAL FIBRILLATION ABLATION (N/A )     Patient location during evaluation: PACU Anesthesia Type: General Level of consciousness: awake Pain management: pain level controlled Vital Signs Assessment: post-procedure vital signs reviewed and stable Respiratory status: spontaneous breathing Cardiovascular status: stable Postop Assessment: no apparent nausea or vomiting Anesthetic complications: no    Last Vitals:  Vitals:   02/07/18 1105 02/07/18 1125  BP: 109/65 110/72  Pulse: (!) 59 (!) 57  Resp: 10 15  Temp:  36.5 C  SpO2: 96%     Last Pain:  Vitals:   02/07/18 1125  TempSrc: Oral  PainSc:    Pain Goal:                 Dama Hedgepeth JR,JOHN Adian Jablonowski

## 2018-02-07 NOTE — Discharge Instructions (Addendum)
Post procedure care instructions No driving for 4 days. No lifting over 5 lbs for 1 week. No vigorous or sexual activity for 1 week. You may return to work on 02/14/18. Keep procedure site clean & dry. If you notice increased pain, swelling, bleeding or pus, call/return!  You may shower, but no soaking baths/hot tubs/pools for 1 week.     You have an appointment set up with the [facility].  Multiple studies have shown that being followed by a dedicated atrial fibrillation clinic in addition to the standard care you receive from your other physicians improves health. We believe that enrollment in the atrial fibrillation clinic will allow [place] to better care for you.   The phone number to the [facility] is [phone]. The clinic is staffed Monday through Friday from 8:30am to 5pm.  Parking Directions: The clinic is located in the Heart and Vascular Building connected to [facility]. 1)From [address] turn on to [address] and go to the 3rd entrance  (Heart and Vascular entrance) on the right. 2)Look to the right for Heart &Vascular Parking Garage. 3)A code for the entrance is required, for August is 1500 4)Take the elevators to the 1st floor. Registration is in the room with the glass walls at the end of the hallway.  If you have any trouble parking or locating the clinic, please dont hesitate to call [phone].   Information on my medicine - ELIQUIS (apixaban)  Why was Eliquis prescribed for you? Eliquis was prescribed for you to reduce the risk of a blood clot forming that can cause a stroke if you have a medical condition called atrial fibrillation (a type of irregular heartbeat).  What do You need to know about Eliquis ? Take your Eliquis TWICE DAILY - one tablet in the morning and one tablet in the evening with or without food. If you have difficulty swallowing the tablet whole please discuss with your pharmacist how to take the  medication safely.  Take Eliquis exactly as prescribed by your doctor and DO NOT stop taking Eliquis without talking to the doctor who prescribed the medication.  Stopping may increase your risk of developing a stroke.  Refill your prescription before you run out.  After discharge, you should have regular check-up appointments with your healthcare provider that is prescribing your Eliquis.  In the future your dose may need to be changed if your kidney function or weight changes by a significant amount or as you get older.  What do you do if you miss a dose? If you miss a dose, take it as soon as you remember on the same day and resume taking twice daily.  Do not take more than one dose of ELIQUIS at the same time to make up a missed dose.  Important Safety Information A possible side effect of Eliquis is bleeding. You should call your healthcare provider right away if you experience any of the following: ? Bleeding from an injury or your nose that does not stop. ? Unusual colored urine (red or dark brown) or unusual colored stools (red or black). ? Unusual bruising for unknown reasons. ? A serious fall or if you hit your head (even if there is no bleeding).  Some medicines may interact with Eliquis and might increase your risk of bleeding or clotting while on Eliquis. To help avoid this, consult your healthcare provider or pharmacist prior to using any new prescription or non-prescription medications, including herbals, vitamins, non-steroidal anti-inflammatory drugs (  NSAIDs) and supplements.  This website has more information on Eliquis (apixaban): http://www.eliquis.com/eliquis/home

## 2018-02-07 NOTE — Progress Notes (Signed)
Pt drank 1-2 glasses of water at 0400-0430 on way into the hospital. Dr Clide Cliff was not available prior to pt needing to go to cath lab. Cath lab holding informed.

## 2018-02-07 NOTE — Transfer of Care (Signed)
Immediate Anesthesia Transfer of Care Note  Patient: Bruce Kelly  Procedure(s) Performed: ATRIAL FIBRILLATION ABLATION (N/A )  Patient Location: PACU  Anesthesia Type:General  Level of Consciousness: awake, alert , oriented and patient cooperative  Airway & Oxygen Therapy: Patient Spontanous Breathing and Patient connected to nasal cannula oxygen  Post-op Assessment: Report given to RN and Post -op Vital signs reviewed and stable  Post vital signs: Reviewed and stable  Last Vitals:  Vitals Value Taken Time  BP 117/65 02/07/2018 10:19 AM  Temp    Pulse 69 02/07/2018 10:19 AM  Resp 12 02/07/2018 10:19 AM  SpO2 97 % 02/07/2018 10:19 AM  Vitals shown include unvalidated device data.  Last Pain:  Vitals:   02/07/18 0556  TempSrc:   PainSc: 0-No pain         Complications: No apparent anesthesia complications

## 2018-02-07 NOTE — Progress Notes (Signed)
Patient was seen and examined by Dr. Curt Bears, planned for discharge one bedrest is completed. VSS Telemetry is SR Patient feels well without SOB or CP Post procedure care instructions were discussed with the patient Routine post procedure follow up has been arranged  Tommye Standard, PA-C

## 2018-02-07 NOTE — Anesthesia Procedure Notes (Signed)
Procedure Name: Intubation Date/Time: 02/07/2018 8:05 AM Performed by: Raenette Rover, CRNA Pre-anesthesia Checklist: Patient identified, Emergency Drugs available, Suction available and Patient being monitored Patient Re-evaluated:Patient Re-evaluated prior to induction Oxygen Delivery Method: Circle system utilized Preoxygenation: Pre-oxygenation with 100% oxygen Induction Type: IV induction Ventilation: Mask ventilation without difficulty Laryngoscope Size: Mac and 4 Grade View: Grade II Tube type: Oral Tube size: 7.5 mm Number of attempts: 1 Airway Equipment and Method: Stylet Placement Confirmation: ETT inserted through vocal cords under direct vision,  CO2 detector,  positive ETCO2 and breath sounds checked- equal and bilateral Secured at: 23 cm Tube secured with: Tape Dental Injury: Teeth and Oropharynx as per pre-operative assessment

## 2018-02-10 MED FILL — Bupivacaine HCl Preservative Free (PF) Inj 0.25%: INTRAMUSCULAR | Qty: 30 | Status: AC

## 2018-02-17 ENCOUNTER — Other Ambulatory Visit: Payer: Self-pay | Admitting: Family Medicine

## 2018-02-17 DIAGNOSIS — E119 Type 2 diabetes mellitus without complications: Secondary | ICD-10-CM

## 2018-02-18 ENCOUNTER — Other Ambulatory Visit: Payer: Self-pay | Admitting: *Deleted

## 2018-02-18 MED ORDER — AMIODARONE HCL 200 MG PO TABS: ORAL_TABLET | ORAL | 1 refills | Status: DC

## 2018-02-18 NOTE — Telephone Encounter (Signed)
*  STAT* If patient is at the pharmacy, call can be transferred to refill team.   1. Which medications need to be refilled? (please list name of each medication and dose if known) Amiodarone 200 mg  2. Which pharmacy/location (including street and city if local pharmacy) is medication to be sent to?[facility]  3. Do they need a 30 day or 90 day supply? [place]

## 2018-03-01 ENCOUNTER — Other Ambulatory Visit: Payer: Self-pay | Admitting: Family Medicine

## 2018-03-03 ENCOUNTER — Telehealth: Payer: Self-pay | Admitting: *Deleted

## 2018-03-03 MED ORDER — BLOOD GLUCOSE METER KIT: PACK | 0 refills | Status: DC

## 2018-03-03 NOTE — Telephone Encounter (Signed)
pharmacy request for patients glucometer strips and lancets to be refilled. Pharmacy didn't specify meter type. Left message for patient to call back with information on what type of glucometer meter he has. OK for [facility] triage to get information and forward to me.

## 2018-03-04 ENCOUNTER — Encounter: Payer: Self-pay | Admitting: *Deleted

## 2018-03-04 NOTE — Telephone Encounter (Signed)
message sent in [facility] Chart

## 2018-03-12 ENCOUNTER — Ambulatory Visit (HOSPITAL_COMMUNITY)
Admission: RE | Admit: 2018-03-12 | Discharge: 2018-03-12 | Disposition: A | Discharge status: Home / Self Care | Payer: 59 | Source: Ambulatory Visit | Attending: Cardiology | Admitting: Cardiology

## 2018-03-12 ENCOUNTER — Encounter (HOSPITAL_COMMUNITY): Payer: Self-pay | Admitting: Nurse Practitioner

## 2018-03-12 ENCOUNTER — Ambulatory Visit: Payer: 59 | Admitting: Cardiology

## 2018-03-12 VITALS — BP 118/82 | HR 60 | Ht 72.0 in | Wt 203.0 lb

## 2018-03-12 DIAGNOSIS — E559 Vitamin D deficiency, unspecified: Secondary | ICD-10-CM | POA: Insufficient documentation

## 2018-03-12 DIAGNOSIS — Z8249 Family history of ischemic heart disease and other diseases of the circulatory system: Secondary | ICD-10-CM | POA: Diagnosis not present

## 2018-03-12 DIAGNOSIS — E1140 Type 2 diabetes mellitus with diabetic neuropathy, unspecified: Secondary | ICD-10-CM | POA: Diagnosis not present

## 2018-03-12 DIAGNOSIS — Z79899 Other long term (current) drug therapy: Secondary | ICD-10-CM | POA: Insufficient documentation

## 2018-03-12 DIAGNOSIS — E785 Hyperlipidemia, unspecified: Secondary | ICD-10-CM | POA: Diagnosis not present

## 2018-03-12 DIAGNOSIS — Z882 Allergy status to sulfonamides status: Secondary | ICD-10-CM | POA: Diagnosis not present

## 2018-03-12 DIAGNOSIS — Z7901 Long term (current) use of anticoagulants: Secondary | ICD-10-CM | POA: Insufficient documentation

## 2018-03-12 DIAGNOSIS — Z9889 Other specified postprocedural states: Secondary | ICD-10-CM | POA: Diagnosis not present

## 2018-03-12 DIAGNOSIS — Z87891 Personal history of nicotine dependence: Secondary | ICD-10-CM | POA: Insufficient documentation

## 2018-03-12 DIAGNOSIS — Z813 Family history of other psychoactive substance abuse and dependence: Secondary | ICD-10-CM | POA: Diagnosis not present

## 2018-03-12 DIAGNOSIS — K219 Gastro-esophageal reflux disease without esophagitis: Secondary | ICD-10-CM | POA: Diagnosis not present

## 2018-03-12 DIAGNOSIS — Z7984 Long term (current) use of oral hypoglycemic drugs: Secondary | ICD-10-CM | POA: Diagnosis not present

## 2018-03-12 DIAGNOSIS — I480 Paroxysmal atrial fibrillation: Secondary | ICD-10-CM | POA: Diagnosis not present

## 2018-03-12 DIAGNOSIS — Z8261 Family history of arthritis: Secondary | ICD-10-CM | POA: Insufficient documentation

## 2018-03-12 DIAGNOSIS — M069 Rheumatoid arthritis, unspecified: Secondary | ICD-10-CM | POA: Diagnosis not present

## 2018-03-12 DIAGNOSIS — Z803 Family history of malignant neoplasm of breast: Secondary | ICD-10-CM | POA: Insufficient documentation

## 2018-03-12 DIAGNOSIS — Z833 Family history of diabetes mellitus: Secondary | ICD-10-CM | POA: Insufficient documentation

## 2018-03-12 DIAGNOSIS — Z880 Allergy status to penicillin: Secondary | ICD-10-CM | POA: Insufficient documentation

## 2018-03-12 DIAGNOSIS — Z811 Family history of alcohol abuse and dependence: Secondary | ICD-10-CM | POA: Diagnosis not present

## 2018-03-12 NOTE — Progress Notes (Signed)
Primary Care Physician: [name], DO Referring Physician: Dr. Curt Bears (EP)   Bruce Kelly is a 57 y.o. male with a h/o DM, paroxysmal afib that is in the afib clinic for f/u ablation one month ago. He reports that he has not had any afib to mention since the procedure. He follows with Bruce Kelly.  No swallowing or groin issues. He is back to his usual walking routine.    Today, he denies symptoms of palpitations, chest pain, shortness of breath, orthopnea, PND, lower extremity edema, dizziness, presyncope, syncope, or neurologic sequela. The patient is tolerating medications without difficulties and is otherwise without complaint today.   Past Medical History:  Diagnosis Date  . Closed right ankle fracture   . Colon polyp 2011   x3 polyps, pts reports 10 year follow up  . 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 ED (erectile dysfunction) 09/23/2017  . Elevated ferritin 2013   479 -->  return to normal  . Encounter for long-term (current) use of other medications 09/23/2017  . Erectile dysfunction 2013   Had been on Cialis  . GERD (gastroesophageal reflux disease) 2013  . Hyperlipidemia LDL goal <130   . Irregular heart rate 09/23/2017  . Neuropathy 09/23/2017  . On statin therapy 04/15/2017  . Osteopenia 2013   prior h/o of Chronic steroids for 10 years; was on Boniva for 6 months. Now takes calcium/vitamin D daily.  . Other and unspecified hyperlipidemia 09/23/2017  . Overweight (BMI 25.0-29.9) 11/22/2016  . Peripheral neuropathy 2009   Did not respond to gabapentin by prior records  . Prediabetes   . RA (rheumatoid arthritis) ([place]) 09/23/2017  . Rheumatoid arthritis ([place]) 1998   had been on chronic steroids for about 10 years  . Rheumatoid lung ([place])    pt reports having thoracentesis for rheumatoid lung many years ago.   . Thrombocytopenia ([place]) 2013   Mildly low at 130  . Type 2 diabetes mellitus without complication, without  long-term current use of insulin ([place]) 11/22/2016  . Vitamin D deficiency 11/22/2016  . Wears glasses    [facility]   Past Surgical History:  Procedure Laterality Date  . ABLATION OF DYSRHYTHMIC FOCUS  02/07/2018  . ATRIAL FIBRILLATION ABLATION N/A 02/07/2018   Procedure: ATRIAL FIBRILLATION ABLATION;  Surgeon: [name], MD;  Location: [facility];  Service: Cardiovascular;  Laterality: N/A;  . COLONOSCOPY    . NO PAST SURGERIES      Current Outpatient Medications  Medication Sig Dispense Refill  . amiodarone (PACERONE) 200 MG tablet Take 200 mg by mouth daily.    Marland Kitchen apixaban (ELIQUIS) 5 MG TABS tablet Take 1 tablet (5 mg total) by mouth 2 (two) times daily. 180 tablet 3  . b complex vitamins capsule Take 1 capsule by mouth daily.    . blood glucose meter kit and supplies Dispense based on patient and insurance  Check blood sugar once daily. 1 each 0  . calcium-vitamin D (OSCAL WITH D) 500-200 MG-UNIT tablet Take 1 tablet by mouth daily with breakfast.     . folic acid (FOLVITE) 1 MG tablet Take 1 mg by mouth daily.     . Golimumab (SIMPONI) 50 MG/0.5ML SOSY Inject 50 Units into the skin every 30 (thirty) days.     . meloxicam (MOBIC) 7.5 MG tablet Take 2 tablets by mouth daily.    . metFORMIN (GLUCOPHAGE) 500 MG tablet Take 1 tablet (  500 mg total) by mouth 2 (two) times daily with a meal. 180 tablet 1  . methotrexate (RHEUMATREX) 2.5 MG tablet Take 4 tablets by mouth once a week.    . Multiple Vitamin (MULTIVITAMIN) capsule Take 1 capsule by mouth daily. Takes womens for extra calcium    . omeprazole (PRILOSEC) 20 MG capsule Take 1 capsule (20 mg total) by mouth daily. (Patient taking differently: Take 20 mg by mouth as needed (acid reflux). ) 90 capsule 3  . rosuvastatin (CRESTOR) 5 MG tablet 3 times week (Patient taking differently: Take 5 mg by mouth See admin instructions. Take one tablet by mouth three times a week on Sunday, Tuesday and Thursday.) 45 tablet 3    No current facility-administered medications for this encounter.     Allergies  Allergen Reactions  . Penicillins Other (See Comments)    Doesn't know  . Sulfa Antibiotics Other (See Comments)    Doesn't know    Social History   Socioeconomic History  . Marital status: Married    Spouse name: Mickel Baas  . Number of children: Not on file  . Years of education: 74  . Highest education level: Not on file  Occupational History  . Not on file  Social Needs  . Financial resource strain: Not on file  . Food insecurity:    Worry: Not on file    Inability: Not on file  . Transportation needs:    Medical: Not on file    Non-medical: Not on file  Tobacco Use  . Smoking status: Former Smoker    Packs/day: 1.50    Years: 10.00    Pack years: 15.00    Types: Cigarettes    Last attempt to quit: 1999    Years since quitting: 20.6  . Smokeless tobacco: Never Used  Substance and Sexual Activity  . Alcohol use: Yes    Alcohol/week: 4.0 standard drinks    Types: 2 Cans of beer, 2 Shots of liquor per week  . Drug use: No  . Sexual activity: Yes    Partners: Female    Birth control/protection: Pill  Lifestyle  . Physical activity:    Days per week: Not on file    Minutes per session: Not on file  . Stress: Not on file  Relationships  . Social connections:    Talks on phone: Not on file    Gets together: Not on file    Attends religious service: Not on file    Active member of club or organization: Not on file    Attends meetings of clubs or organizations: Not on file    Relationship status: Not on file  . Intimate partner violence:    Fear of current or ex partner: Not on file    Emotionally abused: Not on file    Physically abused: Not on file    Forced sexual activity: Not on file  Other Topics Concern  . Not on file  Social History Narrative   Drinks caffeine, takes a daily vitamin.   Masters degree, Materials engineer.   Exercises routinely.   Wears his seatbelt, firearms  locked in the home.   Feels  safe in his relationships.    Family History  Problem Relation Age of Onset  . Arthritis Mother   . Diabetes Father   . Heart disease Father   . Arthritis Sister   . Diabetes Sister   . Alcohol abuse Brother   . Drug abuse Brother   . Breast  cancer Paternal Aunt   . Early death Maternal Grandfather     ROS- All systems are reviewed and negative except as per the HPI above  Physical Exam: Vitals:   03/12/18 0859  BP: 118/82  Pulse: 60  Weight: 92.1 kg  Height: 6' (1.829 m)   Wt Readings from Last 3 Encounters:  03/12/18 92.1 kg  02/07/18 90.7 kg  01/29/18 93.6 kg    Labs: Lab Results  Component Value Date   NA 138 01/29/2018   K 4.2 01/29/2018   CL 99 01/29/2018   CO2 25 01/29/2018   GLUCOSE 244 (H) 01/29/2018   BUN 18 01/29/2018   CREATININE 1.01 01/29/2018   CALCIUM 9.2 01/29/2018   MG 2.0 09/26/2017   No results found for: INR Lab Results  Component Value Date   CHOL 173 12/05/2017   HDL 43.40 12/05/2017   LDLCALC 103 (H) 12/05/2017   TRIG 135.0 12/05/2017     GEN- The patient is well appearing, alert and oriented x 3 today.   Head- normocephalic, atraumatic Eyes-  Sclera clear, conjunctiva pink Ears- hearing intact Oropharynx- clear Neck- supple, no JVP Lymph- no cervical lymphadenopathy Lungs- Clear to ausculation bilaterally, normal work of breathing Heart- Regular rate and rhythm, no murmurs, rubs or gallops, PMI not laterally displaced GI- soft, NT, ND, + BS Extremities- no clubbing, cyanosis, or edema MS- no significant deformity or atrophy Skin- no rash or lesion Psych- euthymic mood, full affect Neuro- strength and sensation are intact  EKG- SR at 60 bpm, PR int 212 ms, qrs int 90 ms, qtc 416 ms    Assessment and Plan: 1. Paroxymal afib S/p ablation No afib to report Feels well  Continue use of amiodarone 200 mg daily, anticipate this will be stopped at 3 month f/u with Dr. Curt Bears  2.  CHA2DS2VASc score of 1(DM) Continue eliquis 5 mg bid without interruption during healing period of ablation  F/u with Dr.Camnitz as scheduled 11/11  Butch Penny C. Trayvion Embleton, [facility] [address] [place], [place] [address] [phone]

## 2018-03-14 ENCOUNTER — Other Ambulatory Visit: Payer: Self-pay | Admitting: *Deleted

## 2018-03-28 MED ORDER — AMIODARONE HCL 200 MG PO TABS: 200.0000 mg | ORAL_TABLET | Freq: Every day | ORAL | 0 refills | Status: DC

## 2018-04-09 ENCOUNTER — Ambulatory Visit: Payer: 59 | Admitting: Family Medicine

## 2018-04-09 ENCOUNTER — Other Ambulatory Visit: Payer: Self-pay | Admitting: Family Medicine

## 2018-04-09 DIAGNOSIS — E119 Type 2 diabetes mellitus without complications: Secondary | ICD-10-CM

## 2018-04-14 ENCOUNTER — Encounter: Payer: Self-pay | Admitting: Family Medicine

## 2018-04-14 ENCOUNTER — Ambulatory Visit (INDEPENDENT_AMBULATORY_CARE_PROVIDER_SITE_OTHER): Payer: 59 | Admitting: Family Medicine

## 2018-04-14 VITALS — BP 113/74 | HR 64 | Temp 98.4°F | Resp 20 | Ht 72.0 in | Wt 203.0 lb

## 2018-04-14 DIAGNOSIS — I480 Paroxysmal atrial fibrillation: Secondary | ICD-10-CM

## 2018-04-14 DIAGNOSIS — E119 Type 2 diabetes mellitus without complications: Secondary | ICD-10-CM | POA: Diagnosis not present

## 2018-04-14 LAB — POCT GLYCOSYLATED HEMOGLOBIN (HGB A1C): HEMOGLOBIN A1C: 6.1 % — AB (ref 4.0–5.6)

## 2018-04-14 MED ORDER — METFORMIN HCL 500 MG PO TABS: 500.0000 mg | ORAL_TABLET | Freq: Two times a day (BID) | ORAL | 1 refills | Status: DC

## 2018-04-14 NOTE — Progress Notes (Signed)
.     Patient ID: Bruce Kelly, male  DOB: 1960-08-04, 57 y.o.   MRN: [id number] Patient Care Team    Relationship Specialty Notifications Start End  [name], DO PCP - General Family Medicine  11/21/16   [name], MD PCP - Electrophysiology Cardiology Admissions 12/24/17   [name]  11/22/16   Marvene Staff  Family Medicine  09/26/17   [name], MD Consulting Physician Cardiology  12/06/17     Chief Complaint  Patient presents with  . Diabetes    Subjective:  Bruce Kelly is a 57 y.o.  male present for   Atrial fibrillation: Started on flecainide May 2. It di dnot work well. He had an ablation and started Amiodarone instead. He has follow up with cards in November. He feels pounding heart at times, but no racing or symptoms of a. Fib.  Prior note:  Patient has a history of hyperlipidemia with elevated triglycerides, currently prescribed Crestor 5 mg 3 times a week and has started fish oil since cholesterol. He is tolerating meds. Patient reports he has had sensations of his heart racing approximately 1-2x months, last about 1-2 hours. Saw cardiology  2 years ago for this condition and he was told all studies were normal. He reports having a stress test at that time. He denies any associated symptoms with his heart racing, no chest pain, no dizziness, no syncope, no shortness of breath.  Diabetes:  Patient reports compliance with meds.  Diagnosed with diabetes May 2018,prediabetic for some time. He has attended the nutrition classes. Patient denies dizziness, hyperglycemic or hypoglycemic events. Patient denies numbness, tingling in the extremities or nonhealing wounds of feet.  PNA series:Prevnar 13 provided 08/29/2017,Pneumovax will be offered in one year Flu shot: Flu shot completed2019(recommneded yearly) BMP: Within normal limits, completed at his rheumatologist. Foot exam:Completed 08/29/2017 Eye  exam: 03/05/2017, [facility].   Depression screen [facility] 2/9 12/05/2017 04/15/2017 12/20/2016 11/21/2016  Decreased Interest 0 0 0 0  Down, Depressed, Hopeless 0 0 0 0  PHQ - 2 Score 0 0 0 0   No flowsheet data found.    Fall Risk  12/05/2017 12/05/2017 12/20/2016 11/21/2016  Falls in the past year? No No No No   Immunization History  Administered Date(s) Administered  . Influenza,inj,Quad PF,6+ Mos 04/15/2017  . Influenza-Unspecified 07/28/2015  . Pneumococcal Conjugate-13 08/29/2017  . Tdap 12/05/2017    No exam data present  Past Medical History:  Diagnosis Date  . Closed right ankle fracture   . Colon polyp 2011   x3 polyps, pts reports 10 year follow up  . 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 ED (erectile dysfunction) 09/23/2017  . Elevated ferritin 2013   479 -->  return to normal  . Encounter for long-term (current) use of other medications 09/23/2017  . Erectile dysfunction 2013   Had been on Cialis  . GERD (gastroesophageal reflux disease) 2013  . Hyperlipidemia LDL goal <130   . Irregular heart rate 09/23/2017  . Neuropathy 09/23/2017  . On statin therapy 04/15/2017  . Osteopenia 2013   prior h/o of Chronic steroids for 10 years; was on Boniva for 6 months. Now takes calcium/vitamin D daily.  . Other and unspecified hyperlipidemia 09/23/2017  . Overweight (BMI 25.0-29.9) 11/22/2016  . Peripheral neuropathy 2009   Did not respond to gabapentin by prior records  . Prediabetes   .  RA (rheumatoid arthritis) ([place]) 09/23/2017  . Rheumatoid arthritis ([place]) 1998   had been on chronic steroids for about 10 years  . Rheumatoid lung ([place])    pt reports having thoracentesis for rheumatoid lung many years ago.   . Thrombocytopenia ([place]) 2013   Mildly low at 130  . Type 2 diabetes mellitus without complication, without long-term current use of insulin ([place]) 11/22/2016  . Vitamin D deficiency 11/22/2016  . Wears glasses    [facility]   Allergies  Allergen Reactions  . Penicillins Other (See Comments)    Doesn't know  . Sulfa Antibiotics Other (See Comments)    Doesn't know   Past Surgical History:  Procedure Laterality Date  . ABLATION OF DYSRHYTHMIC FOCUS  02/07/2018  . ATRIAL FIBRILLATION ABLATION N/A 02/07/2018   Procedure: ATRIAL FIBRILLATION ABLATION;  Surgeon: [name], MD;  Location: [facility];  Service: Cardiovascular;  Laterality: N/A;  . COLONOSCOPY    . NO PAST SURGERIES     Family History  Problem Relation Age of Onset  . Arthritis Mother   . Diabetes Father   . Heart disease Father   . Arthritis Sister   . Diabetes Sister   . Alcohol abuse Brother   . Drug abuse Brother   . Breast cancer Paternal Aunt   . Early death Maternal Grandfather    Social History   Socioeconomic History  . Marital status: Married    Spouse name: Mickel Baas  . Number of children: Not on file  . Years of education: 46  . Highest education level: Not on file  Occupational History  . Not on file  Social Needs  . Financial resource strain: Not on file  . Food insecurity:    Worry: Not on file    Inability: Not on file  . Transportation needs:    Medical: Not on file    Non-medical: Not on file  Tobacco Use  . Smoking status: Former Smoker    Packs/day: 1.50    Years: 10.00    Pack years: 15.00    Types: Cigarettes    Last attempt to quit: 1999    Years since quitting: 20.7  . Smokeless tobacco: Never Used  Substance and Sexual Activity  . Alcohol use: Yes    Alcohol/week: 4.0 standard drinks    Types: 2 Cans of beer, 2 Shots of liquor per week  . Drug use: No  . Sexual activity: Yes    Partners: Female    Birth control/protection: Pill  Lifestyle  . Physical activity:    Days per week: Not on file    Minutes per session: Not on file  . Stress: Not on file  Relationships  . Social connections:    Talks on phone: Not on file    Gets together: Not on file    Attends  religious service: Not on file    Active member of club or organization: Not on file    Attends meetings of clubs or organizations: Not on file    Relationship status: Not on file  . Intimate partner violence:    Fear of current or ex partner: Not on file    Emotionally abused: Not on file    Physically abused: Not on file    Forced sexual activity: Not on file  Other Topics Concern  . Not on file  Social History Narrative   Drinks caffeine, takes a daily vitamin.   Masters degree, Materials engineer.  Exercises routinely.   Wears his seatbelt, firearms locked in the home.   Feels  safe in his relationships.   Allergies as of 04/14/2018      Reactions   Penicillins Other (See Comments)   Doesn't know   Sulfa Antibiotics Other (See Comments)   Doesn't know      Medication List        Accurate as of 04/14/18  8:19 AM. Always use your most recent med list.          amiodarone 200 MG tablet Commonly known as:  PACERONE Take 1 tablet (200 mg total) by mouth daily.   apixaban 5 MG Tabs tablet Commonly known as:  ELIQUIS Take 1 tablet (5 mg total) by mouth 2 (two) times daily.   b complex vitamins capsule Take 1 capsule by mouth daily.   blood glucose meter kit and supplies Dispense based on patient and insurance  Check blood sugar once daily.   calcium-vitamin D 500-200 MG-UNIT tablet Commonly known as:  OSCAL WITH D Take 1 tablet by mouth daily with breakfast.   folic acid 1 MG tablet Commonly known as:  FOLVITE Take 1 mg by mouth daily.   meloxicam 7.5 MG tablet Commonly known as:  MOBIC Take 2 tablets by mouth daily.   metFORMIN 500 MG tablet Commonly known as:  GLUCOPHAGE Take 1 tablet (500 mg total) by mouth 2 (two) times daily with a meal.   methotrexate 2.5 MG tablet Commonly known as:  RHEUMATREX Take 4 tablets by mouth once a week.   multivitamin capsule Take 1 capsule by mouth daily. Takes womens for extra calcium   omeprazole 20 MG capsule Commonly  known as:  PRILOSEC Take 1 capsule (20 mg total) by mouth daily.   rosuvastatin 5 MG tablet Commonly known as:  CRESTOR 3 times week   SIMPONI 50 MG/0.5ML Sosy Generic drug:  Golimumab Inject 50 Units into the skin every 30 (thirty) days.       All past medical history, surgical history, allergies, family history, immunizations andmedications were updated in the EMR today and reviewed under the history and medication portions of their EMR.    Recent Results (from the past 2160 hour(s))  Basic Metabolic Panel (BMET)     Status: Abnormal   Collection Time: 01/29/18  4:25 PM  Result Value Ref Range   Glucose 244 (H) 65 - 99 mg/dL   BUN 18 6 - 24 mg/dL   Creatinine, Ser 1.01 0.76 - 1.27 mg/dL   GFR calc non Af Amer 82 >59 mL/min/1.73   GFR calc Af Amer 94 >59 mL/min/1.73   BUN/Creatinine Ratio 18 9 - 20   Sodium 138 134 - 144 mmol/L   Potassium 4.2 3.5 - 5.2 mmol/L   Chloride 99 96 - 106 mmol/L   CO2 25 20 - 29 mmol/L   Calcium 9.2 8.7 - 10.2 mg/dL  CBC     Status: Abnormal   Collection Time: 01/29/18  4:25 PM  Result Value Ref Range   WBC 5.8 3.4 - 10.8 x10E3/uL   RBC 5.02 4.14 - 5.80 x10E6/uL   Hemoglobin 15.6 13.0 - 17.7 g/dL   Hematocrit 46.0 37.5 - 51.0 %   MCV 92 79 - 97 fL   MCH 31.1 26.6 - 33.0 pg   MCHC 33.9 31.5 - 35.7 g/dL   RDW 14.1 12.3 - 15.4 %   Platelets 148 (L) 150 - 450 x10E3/uL  Glucose, capillary     Status: Abnormal  Collection Time: 02/07/18  5:43 AM  Result Value Ref Range   Glucose-Capillary 150 (H) 70 - 99 mg/dL  POCT Activated clotting time     Status: None   Collection Time: 02/07/18  8:56 AM  Result Value Ref Range   Activated Clotting Time 296 seconds  POCT Activated clotting time     Status: None   Collection Time: 02/07/18  9:23 AM  Result Value Ref Range   Activated Clotting Time 329 seconds  Glucose, capillary     Status: Abnormal   Collection Time: 02/07/18 10:26 AM  Result Value Ref Range   Glucose-Capillary 112 (H) 70 - 99  mg/dL  POCT Activated clotting time     Status: None   Collection Time: 02/07/18 10:28 AM  Result Value Ref Range   Activated Clotting Time 186 seconds  Glucose, capillary     Status: Abnormal   Collection Time: 02/07/18 11:38 AM  Result Value Ref Range   Glucose-Capillary 144 (H) 70 - 99 mg/dL  POCT glycosylated hemoglobin (Hb A1C)     Status: Abnormal   Collection Time: 04/14/18  8:18 AM  Result Value Ref Range   Hemoglobin A1C 6.1 (A) 4.0 - 5.6 %   HbA1c POC (<> result, manual entry)     HbA1c, POC (prediabetic range)     HbA1c, POC (controlled diabetic range)      Patient was never admitted.   ROS: 14 pt review of systems performed and negative (unless mentioned in an HPI)  Objective: BP 113/74 (BP Location: Left Arm, Patient Position: Sitting, Cuff Size: Normal)   Pulse 64   Temp 98.4 F (36.9 C)   Resp 20   Ht 6' (1.829 m)   Wt 203 lb (92.1 kg)   SpO2 97%   BMI 27.53 kg/m  Gen: Afebrile. No acute distress.  HENT: AT. [place].MMM. Eyes:Pupils Equal Round Reactive to light, Extraocular movements intact,  Conjunctiva without redness, discharge or icterus. Neck/lymp/endocrine: Supple,no lymphadenopathy, no thyromegaly CV: RRR no murmur, no edema, +2/4 P posterior tibialis pulses Chest: CTAB, no wheeze or crackles Abd: Soft. NTND. BS present. Skin: no rashes, purpura or petechiae.  Neuro:  Normal gait. PERLA. EOMi. Alert. Oriented.   Results for orders placed or performed in visit on 04/14/18 (from the past 24 hour(s))  POCT glycosylated hemoglobin (Hb A1C)     Status: Abnormal   Collection Time: 04/14/18  8:18 AM  Result Value Ref Range   Hemoglobin A1C 6.1 (A) 4.0 - 5.6 %   HbA1c POC (<> result, manual entry)     HbA1c, POC (prediabetic range)     HbA1c, POC (controlled diabetic range)      Assessment/plan: Bruce Kelly is a 58 y.o. male present for  type 2 diabetes -Stable.A1c 5.8-->6.4--> 6.1 collectedtoday. Continue withmetformin500 mg twice a day. -  Flu shot completed2019 -Prevnar completed t 08/29/2017 -Microalbumin completed  08/29/2017 -Foot exam completed  08/29/2017 - encouraged to make eye appt last 02/2017 - Follow-up3-4 months  Heart racing /Hyperlipidemia LDL goal <130/statin use/Palpitation/ atrial fibrillation ([place]) - S/p ablation, now on amiodarone. Doing well.  - Continue Crestor 5 mg Following with cardiology.    Note is dictated utilizing voice recognition software. Although note has been proof read prior to signing, occasional typographical errors still can be missed. If any questions arise, please do not hesitate to call for verification.    Electronically signed by: Howard Pouch, DO [place]

## 2018-04-14 NOTE — Patient Instructions (Signed)
a1c is improved to 6.1. Refilled your meds.  Follow up in 4 months.  I hope your trips .  Please help [place] help you:  We are honored you have chosen [facility] for your [facility] home. Below you will find basic instructions that you may need to access in the future. Please help [place] help you by reading the instructions, which cover many of the frequent questions we experience.   Prescription refills and request:  -In order to allow more efficient response time, please call your pharmacy for all refills. They will forward the request electronically to [place]. This allows for the quickest possible response. Request left on a nurse line can take longer to refill, since these are checked as time allows between office patients and other phone calls.  - refill request can take up to 3-5 working days to complete.  - If request is sent electronically and request is appropiate, it is usually completed in 1-2 business days.  - all patients will need to be seen routinely for all chronic medical conditions requiring prescription medications (see follow-up below). If you are overdue for follow up on your condition, you will be asked to make an appointment and we will call in enough medication to cover you until your appointment (up to 30 days).  - all controlled substances will require a face to face visit to request/refill.  - if you desire your prescriptions to go through a new pharmacy, and have an active script at original pharmacy, you will need to call your pharmacy and have scripts transferred to new pharmacy. This is completed between the pharmacy locations and not by your provider.    Results: If any images or labs were ordered, it can take up to 1 week to get results depending on the test ordered and the lab/facility running and resulting the test. - Normal or stable results, which do not need further discussion, may be released to your mychart immediately with attached note to you. A call may not  be generated for normal results. Please make certain to sign up for mychart. If you have questions on how to activate your mychart you can call the front office.  - If your results need further discussion, our office will attempt to contact you via phone, and if unable to reach you after 2 attempts, we will release your abnormal result to your mychart with instructions.  - All results will be automatically released in mychart after 1 week.  - Your provider will provide you with explanation and instruction on all relevant material in your results. Please keep in mind, results and labs may appear confusing or abnormal to the untrained eye, but it does not mean they are actually abnormal for you personally. If you have any questions about your results that are not covered, or you desire more detailed explanation than what was provided, you should make an appointment with your provider to do so.   Our office handles many outgoing and incoming calls daily. If we have not contacted you within 1 week about your results, please check your mychart to see if there is a message first and if not, then contact our office.  In helping with this matter, you help decrease call volume, and therefore allow [place] to be able to respond to patients needs more efficiently.   Acute office visits (sick visit):  An acute visit is intended for a new problem and are scheduled in shorter time slots to allow schedule openings  for patients with new problems. This is the appropriate visit to discuss a new problem. Problems will not be addressed by phone call or Echart message. Appointment is needed if requesting treatment. In order to provide you with excellent quality medical care with proper time for you to explain your problem, have an exam and receive treatment with instructions, these appointments should be limited to one new problem per visit. If you experience a new problem, in which you desire to be addressed, please make an acute  office visit, we save openings on the schedule to accommodate you. Please do not save your new problem for any other type of visit, let us take care of it properly and quickly for you.   Follow up visits:  Depending on your condition(s) your provider will need to see you routinely in order to provide you with quality care and prescribe medication(s). Most chronic conditions (Example: hypertension, Diabetes, depression/anxiety... etc), require visits a couple times a year. Your provider will instruct you on proper follow up for your personal medical conditions and history. Please make certain to make follow up appointments for your condition as instructed. Failing to do so could result in lapse in your medication treatment/refills. If you request a refill, and are overdue to be seen on a condition, we will always provide you with a 30 Friedl script (once) to allow you time to schedule.    Medicare wellness (well visit): - we have a wonderful Nurse Maudie Mercury), that will meet with you and provide you will yearly medicare wellness visits. These visits should occur yearly (can not be scheduled less than 1 calendar year apart) and cover preventive health, immunizations, advance directives and screenings you are entitled to yearly through your medicare benefits. Do not miss out on your entitled benefits, this is when medicare will pay for these benefits to be ordered for you.  These are strongly encouraged by your provider and is the appropriate type of visit to make certain you are up to date with all preventive health benefits. If you have not had your medicare wellness exam in the last 12 months, please make certain to schedule one by calling the office and schedule your medicare wellness with Maudie Mercury as soon as possible.   Yearly physical (well visit):  - Adults are recommended to be seen yearly for physicals. Check with your insurance and date of your last physical, most insurances require one calendar year between  physicals. Physicals include all preventive health topics, screenings, medical exam and labs that are appropriate for gender/age and history. You may have fasting labs needed at this visit. This is a well visit (not a sick visit), new problems should not be covered during this visit (see acute visit).  - Pediatric patients are seen more frequently when they are younger. Your provider will advise you on well child visit timing that is appropriate for your their age. - This is not a medicare wellness visit. Medicare wellness exams do not have an exam portion to the visit. Some medicare companies allow for a physical, some do not allow a yearly physical. If your medicare allows a yearly physical you can schedule the medicare wellness with our nurse Maudie Mercury and have your physical with your provider after, on the same Melfi. Please check with insurance for your full benefits.   Late Policy/No Shows:  - all new patients should arrive 15-30 minutes earlier than appointment to allow [place] time  to  obtain all personal demographics,  insurance information and  for you to complete office paperwork. - All established patients should arrive 10-15 minutes earlier than appointment time to update all information and be checked in .  - In our best efforts to run on time, if you are late for your appointment you will be asked to either reschedule or if able, we will work you back into the schedule. There will be a wait time to work you back in the schedule,  depending on availability.  - If you are unable to make it to your appointment as scheduled, please call 24 hours ahead of time to allow [place] to fill the time slot with someone else who needs to be seen. If you do not cancel your appointment ahead of time, you may be charged a no show fee.

## 2018-04-24 DIAGNOSIS — M0579 Rheumatoid arthritis with rheumatoid factor of multiple sites without organ or systems involvement: Secondary | ICD-10-CM | POA: Diagnosis not present

## 2018-04-24 DIAGNOSIS — Z23 Encounter for immunization: Secondary | ICD-10-CM | POA: Diagnosis not present

## 2018-04-24 DIAGNOSIS — I499 Cardiac arrhythmia, unspecified: Secondary | ICD-10-CM | POA: Diagnosis not present

## 2018-04-24 DIAGNOSIS — M150 Primary generalized (osteo)arthritis: Secondary | ICD-10-CM | POA: Diagnosis not present

## 2018-05-19 ENCOUNTER — Ambulatory Visit: Payer: 59 | Admitting: Cardiology

## 2018-05-27 ENCOUNTER — Ambulatory Visit (INDEPENDENT_AMBULATORY_CARE_PROVIDER_SITE_OTHER): Payer: 59 | Admitting: Cardiology

## 2018-05-27 ENCOUNTER — Encounter: Payer: Self-pay | Admitting: Cardiology

## 2018-05-27 VITALS — BP 122/80 | HR 63 | Ht 72.0 in | Wt 212.0 lb

## 2018-05-27 DIAGNOSIS — E785 Hyperlipidemia, unspecified: Secondary | ICD-10-CM

## 2018-05-27 DIAGNOSIS — I480 Paroxysmal atrial fibrillation: Secondary | ICD-10-CM | POA: Diagnosis not present

## 2018-05-27 NOTE — Patient Instructions (Signed)
Medication Instructions:  Your physician has recommended you make the following change in your medication: 1. STOP Amiodarone 2. STOP Eliquis  * If you need a refill on your cardiac medications before your next appointment, please call your pharmacy.   Labwork: None ordered  Testing/Procedures: None ordered  Follow-Up: Your physician recommends that you schedule a follow-up appointment in: 3 months with Dr. Curt Bears.  Thank you for choosing [facility]!!   Trinidad Curet, RN [phone]

## 2018-05-27 NOTE — Progress Notes (Signed)
Electrophysiology Office Note   Date:  05/27/2018   ID:  Leonie Green Riehle, DOB April 18, 1961, MRN [id number]  PCP:  [name], DO  Cardiologist:  Bettina Gavia Primary Electrophysiologist:  [name], MD    No chief complaint on file.    History of Present Illness: PRANIT OWENSBY is a 57 y.o. male who is being seen today for the evaluation of atrial fibrillation at the request of Shirlee More. Presenting today for electrophysiology evaluation.  He has a history of hyperlipidemia, atrial fibrillation, and type 2 diabetes.  He is currently on flecainide and he is having unfortunately breakthrough atrial fibrillation.  His symptoms from his atrial fibrillation are weakness, fatigue.  Over the past few weeks he has had quite a bit more atrial fibrillation with episodes occurring every other day and lasting up to 8 hours. S/p AF ablation 02/07/18.  Today, denies symptoms of palpitations, chest pain, shortness of breath, orthopnea, PND, lower extremity edema, claudication, dizziness, presyncope, syncope, bleeding, or neurologic sequela. The patient is tolerating medications without difficulties.  Overall he is feeling well.  He has no chest pain or shortness of breath.  He is noted no further episodes of atrial fibrillation.  He has had a few episodes of palpitations almost like the A. fib Sheranda Seabrooks start, but he is yet to trigger.   Past Medical History:  Diagnosis Date  . Closed right ankle fracture   . Colon polyp 2011   x3 polyps, pts reports 10 year follow up  . 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 ED (erectile dysfunction) 09/23/2017  . Elevated ferritin 2013   479 -->  return to normal  . Encounter for long-term (current) use of other medications 09/23/2017  . Erectile dysfunction 2013   Had been on Cialis  . GERD (gastroesophageal reflux disease) 2013  . Hyperlipidemia LDL goal <130   . Irregular heart rate 09/23/2017  . Neuropathy  09/23/2017  . On statin therapy 04/15/2017  . Osteopenia 2013   prior h/o of Chronic steroids for 10 years; was on Boniva for 6 months. Now takes calcium/vitamin D daily.  . Other and unspecified hyperlipidemia 09/23/2017  . Overweight (BMI 25.0-29.9) 11/22/2016  . Peripheral neuropathy 2009   Did not respond to gabapentin by prior records  . Prediabetes   . RA (rheumatoid arthritis) ([place]) 09/23/2017  . Rheumatoid arthritis ([place]) 1998   had been on chronic steroids for about 10 years  . Rheumatoid lung ([place])    pt reports having thoracentesis for rheumatoid lung many years ago.   . Thrombocytopenia ([place]) 2013   Mildly low at 130  . Type 2 diabetes mellitus without complication, without long-term current use of insulin ([place]) 11/22/2016  . Vitamin D deficiency 11/22/2016  . Wears glasses    [facility]   Past Surgical History:  Procedure Laterality Date  . ABLATION OF DYSRHYTHMIC FOCUS  02/07/2018  . ATRIAL FIBRILLATION ABLATION N/A 02/07/2018   Procedure: ATRIAL FIBRILLATION ABLATION;  Surgeon: [name], MD;  Location: [facility];  Service: Cardiovascular;  Laterality: N/A;  . COLONOSCOPY    . NO PAST SURGERIES       Current Outpatient Medications  Medication Sig Dispense Refill  . b complex vitamins capsule Take 1 capsule by mouth daily.    . blood glucose meter kit and supplies Dispense based on patient and insurance  Check blood sugar once daily. 1 each 0  .  calcium-vitamin D (OSCAL WITH D) 500-200 MG-UNIT tablet Take 1 tablet by mouth daily with breakfast.     . folic acid (FOLVITE) 1 MG tablet Take 1 mg by mouth daily.     . Golimumab (SIMPONI) 50 MG/0.5ML SOSY Inject 50 Units into the skin every 30 (thirty) days.     . meloxicam (MOBIC) 7.5 MG tablet Take 2 tablets by mouth daily.    . metFORMIN (GLUCOPHAGE) 500 MG tablet Take 1 tablet (500 mg total) by mouth 2 (two) times daily with a meal. 180 tablet 1  . methotrexate (RHEUMATREX) 2.5 MG tablet Take 4  tablets by mouth once a week.    . Multiple Vitamin (MULTIVITAMIN) capsule Take 1 capsule by mouth daily. Takes womens for extra calcium    . omeprazole (PRILOSEC) 20 MG capsule Take 1 capsule (20 mg total) by mouth daily. 90 capsule 3  . rosuvastatin (CRESTOR) 5 MG tablet 3 times week 45 tablet 3   No current facility-administered medications for this visit.     Allergies:   Penicillins and Sulfa antibiotics   Social History:  The patient  reports that he quit smoking about 20 years ago. His smoking use included cigarettes. He has a 15.00 pack-year smoking history. He has never used smokeless tobacco. He reports that he drinks about 4.0 standard drinks of alcohol per week. He reports that he does not use drugs.   Family History:  The patient's family history includes Alcohol abuse in his brother; Arthritis in his mother and sister; Breast cancer in his paternal aunt; Diabetes in his father and sister; Drug abuse in his brother; Early death in his maternal grandfather; Heart disease in his father.   ROS:  Please see the history of present illness.   Otherwise, review of systems is positive for none.   All other systems are reviewed and negative.   PHYSICAL EXAM: VS:  BP 122/80   Pulse 63   Ht 6' (1.829 m)   Wt 212 lb (96.2 kg)   BMI 28.75 kg/m  , BMI Body mass index is 28.75 kg/m. GEN: Well nourished, well developed, in no acute distress  HEENT: normal  Neck: no JVD, carotid bruits, or masses Cardiac: RRR; no murmurs, rubs, or gallops,no edema  Respiratory:  clear to auscultation bilaterally, normal work of breathing GI: soft, nontender, nondistended, + BS MS: no deformity or atrophy  Skin: warm and dry Neuro:  Strength and sensation are intact Psych: euthymic mood, full affect  EKG:  EKG is ordered today. Personal review of the ekg ordered shows sinus rhythm, first-degree AV block  Recent Labs: 09/26/2017: Magnesium 2.0 12/05/2017: ALT 24; TSH 1.67 01/29/2018: BUN 18;  Creatinine, Ser 1.01; Hemoglobin 15.6; Platelets 148; Potassium 4.2; Sodium 138    Lipid Panel     Component Value Date/Time   CHOL 173 12/05/2017 0910   TRIG 135.0 12/05/2017 0910   HDL 43.40 12/05/2017 0910   CHOLHDL 4 12/05/2017 0910   VLDL 27.0 12/05/2017 0910   LDLCALC 103 (H) 12/05/2017 0910     Wt Readings from Last 3 Encounters:  05/27/18 212 lb (96.2 kg)  04/14/18 203 lb (92.1 kg)  03/12/18 203 lb (92.1 kg)      Other studies Reviewed: Additional studies/ records that were reviewed today include: TTE 10/25/17  Review of the above records today demonstrates:  - Left ventricle: The cavity size was normal. Wall thickness was   normal. Systolic function was normal. The estimated ejection   fraction was  in the range of 55% to 60%. Wall motion was normal;   there were no regional wall motion abnormalities. Features are   consistent with a pseudonormal left ventricular filling pattern,   with concomitant abnormal relaxation and increased filling   pressure (grade 2 diastolic dysfunction). - Ascending aorta: The ascending aorta was moderately dilated. - Mitral valve: There was mild regurgitation. - Left atrium: The atrium was mildly dilated. - Right ventricle: The cavity size was moderately dilated. Wall   thickness was normal. - Right atrium: The atrium was moderately dilated.   ASSESSMENT AND PLAN:  1.  Paroxysmal atrial fibrillation: Currently on Eliquis and amiodarone.  Had AF ablation 02/07/2018.  He has done well without recurrence.  Due to his low stroke risk, we Marvin Grabill stop Eliquis.  We Allysson Rinehimer also stop amiodarone.  This patients CHA2DS2-VASc Score and unadjusted Ischemic Stroke Rate (% per year) is equal to 0.6 % stroke rate/year from a score of 1  Above score calculated as 1 point each if present [CHF, HTN, DM, Vascular=MI/PAD/Aortic Plaque, Age if 65-74, or Male] Above score calculated as 2 points each if present [Age > 75, or Stroke/TIA/TE]   2.  Hyperlipidemia: continue crestor    Current medicines are reviewed at length with the patient today.   The patient does not have concerns regarding his medicines.  The following changes were made today: Stop Eliquis, amiodarone  Labs/ tests ordered today include:  Orders Placed This Encounter  Procedures  . EKG 12-Lead    Disposition:   FU with Karli Wickizer 3 months  Signed, Kevona Lupinacci Meredith Leeds, MD  05/27/2018 11:50 AM     [facility] [address] [place] [address] [phone] (office) [phone] (fax)

## 2018-06-08 ENCOUNTER — Other Ambulatory Visit: Payer: Self-pay | Admitting: Cardiology

## 2018-06-11 NOTE — Telephone Encounter (Signed)
Current medicines are reviewed at length with the patient today.   The patient does not have concerns regarding his medicines.  The following changes were made today: Stop Eliquis, amiodarone  Labs/ tests ordered today include:     Orders Placed This Encounter  Procedures  . EKG 12-Lead    Disposition:   FU with Will Camnitz 3 months  Signed, Will Meredith Leeds, MD  05/27/2018 11:50 AM     [facility] [address] [place] [address] [phone] (office) [phone] (fax)    Patient Instructions by Stanton Kidney, RN at 05/27/2018 11:15 AM  Author: Stanton Kidney, RN Author Type: Registered Nurse Filed: 05/27/2018 11:37 AM  Note Status: Signed Cosign: Cosign Not Required Encounter Date: 05/27/2018  Editor: Stanton Kidney, RN (Registered Nurse)    Medication Instructions:  Your physician has recommended you make the following change in your medication: 1. STOP Amiodarone 2. STOP Eliquis

## 2018-07-11 DIAGNOSIS — M791 Myalgia, unspecified site: Secondary | ICD-10-CM | POA: Diagnosis not present

## 2018-07-11 DIAGNOSIS — R05 Cough: Secondary | ICD-10-CM | POA: Diagnosis not present

## 2018-07-11 DIAGNOSIS — J029 Acute pharyngitis, unspecified: Secondary | ICD-10-CM | POA: Diagnosis not present

## 2018-07-11 DIAGNOSIS — J069 Acute upper respiratory infection, unspecified: Secondary | ICD-10-CM | POA: Diagnosis not present

## 2018-07-23 ENCOUNTER — Telehealth: Payer: Self-pay | Admitting: Cardiology

## 2018-07-23 NOTE — Telephone Encounter (Signed)
LAM for patient to return call to reschedule appt

## 2018-08-04 DIAGNOSIS — M150 Primary generalized (osteo)arthritis: Secondary | ICD-10-CM | POA: Diagnosis not present

## 2018-08-04 DIAGNOSIS — Z79899 Other long term (current) drug therapy: Secondary | ICD-10-CM | POA: Diagnosis not present

## 2018-08-04 DIAGNOSIS — M0579 Rheumatoid arthritis with rheumatoid factor of multiple sites without organ or systems involvement: Secondary | ICD-10-CM | POA: Diagnosis not present

## 2018-08-04 DIAGNOSIS — I499 Cardiac arrhythmia, unspecified: Secondary | ICD-10-CM | POA: Diagnosis not present

## 2018-08-07 ENCOUNTER — Other Ambulatory Visit: Payer: Self-pay | Admitting: Physician Assistant

## 2018-08-07 DIAGNOSIS — M8589 Other specified disorders of bone density and structure, multiple sites: Secondary | ICD-10-CM

## 2018-08-13 ENCOUNTER — Ambulatory Visit (INDEPENDENT_AMBULATORY_CARE_PROVIDER_SITE_OTHER): Payer: BLUE CROSS/BLUE SHIELD | Admitting: Family Medicine

## 2018-08-13 ENCOUNTER — Encounter: Payer: Self-pay | Admitting: Family Medicine

## 2018-08-13 VITALS — BP 113/77 | HR 75 | Temp 98.4°F | Resp 16 | Ht 72.0 in | Wt 205.2 lb

## 2018-08-13 DIAGNOSIS — E785 Hyperlipidemia, unspecified: Secondary | ICD-10-CM

## 2018-08-13 DIAGNOSIS — Z79899 Other long term (current) drug therapy: Secondary | ICD-10-CM

## 2018-08-13 DIAGNOSIS — K219 Gastro-esophageal reflux disease without esophagitis: Secondary | ICD-10-CM | POA: Diagnosis not present

## 2018-08-13 DIAGNOSIS — E119 Type 2 diabetes mellitus without complications: Secondary | ICD-10-CM | POA: Diagnosis not present

## 2018-08-13 DIAGNOSIS — E663 Overweight: Secondary | ICD-10-CM

## 2018-08-13 DIAGNOSIS — M059 Rheumatoid arthritis with rheumatoid factor, unspecified: Secondary | ICD-10-CM

## 2018-08-13 DIAGNOSIS — Z23 Encounter for immunization: Secondary | ICD-10-CM

## 2018-08-13 DIAGNOSIS — Z8679 Personal history of other diseases of the circulatory system: Secondary | ICD-10-CM

## 2018-08-13 DIAGNOSIS — Z9889 Other specified postprocedural states: Secondary | ICD-10-CM

## 2018-08-13 DIAGNOSIS — M8580 Other specified disorders of bone density and structure, unspecified site: Secondary | ICD-10-CM

## 2018-08-13 DIAGNOSIS — E559 Vitamin D deficiency, unspecified: Secondary | ICD-10-CM

## 2018-08-13 DIAGNOSIS — D696 Thrombocytopenia, unspecified: Secondary | ICD-10-CM

## 2018-08-13 LAB — POCT GLYCOSYLATED HEMOGLOBIN (HGB A1C)
HBA1C, POC (CONTROLLED DIABETIC RANGE): 6.1 % (ref 0.0–7.0)
HbA1c POC (<> result, manual entry): 6.1 % (ref 4.0–5.6)
HbA1c, POC (prediabetic range): 6.1 % (ref 5.7–6.4)
Hemoglobin A1C: 6.1 % — AB (ref 4.0–5.6)

## 2018-08-13 MED ORDER — ROSUVASTATIN CALCIUM 5 MG PO TABS: ORAL_TABLET | ORAL | 1 refills | Status: DC

## 2018-08-13 MED ORDER — OMEPRAZOLE 20 MG PO CPDR: 20.0000 mg | DELAYED_RELEASE_CAPSULE | Freq: Every day | ORAL | 3 refills | Status: DC

## 2018-08-13 MED ORDER — METFORMIN HCL 500 MG PO TABS: 500.0000 mg | ORAL_TABLET | Freq: Two times a day (BID) | ORAL | 1 refills | Status: DC

## 2018-08-13 NOTE — Patient Instructions (Addendum)
Nurse visit in 3 mos for shingrix #2 or can get at your June appt with me.  a1c is 6.1--- great job.    Please help [place] help you:  We are honored you have chosen [facility] for your Primary Care home. Below you will find basic instructions that you may need to access in the future. Please help [place] help you by reading the instructions, which cover many of the frequent questions we experience.   Prescription refills and request:  -In order to allow more efficient response time, please call your pharmacy for all refills. They will forward the request electronically to [place]. This allows for the quickest possible response. Request left on a nurse line can take longer to refill, since these are checked as time allows between office patients and other phone calls.  - refill request can take up to 3-5 working days to complete.  - If request is sent electronically and request is appropiate, it is usually completed in 1-2 business days.  - all patients will need to be seen routinely for all chronic medical conditions requiring prescription medications (see follow-up below). If you are overdue for follow up on your condition, you will be asked to make an appointment and we will call in enough medication to cover you until your appointment (up to 30 days).  - all controlled substances will require a face to face visit to request/refill.  - if you desire your prescriptions to go through a new pharmacy, and have an active script at original pharmacy, you will need to call your pharmacy and have scripts transferred to new pharmacy. This is completed between the pharmacy locations and not by your provider.    Results: If any images or labs were ordered, it can take up to 1 week to get results depending on the test ordered and the lab/facility running and resulting the test. - Normal or stable results, which do not need further discussion, may be released to your mychart immediately with attached note to you. A  call may not be generated for normal results. Please make certain to sign up for mychart. If you have questions on how to activate your mychart you can call the front office.  - If your results need further discussion, our office will attempt to contact you via phone, and if unable to reach you after 2 attempts, we will release your abnormal result to your mychart with instructions.  - All results will be automatically released in mychart after 1 week.  - Your provider will provide you with explanation and instruction on all relevant material in your results. Please keep in mind, results and labs may appear confusing or abnormal to the untrained eye, but it does not mean they are actually abnormal for you personally. If you have any questions about your results that are not covered, or you desire more detailed explanation than what was provided, you should make an appointment with your provider to do so.   Our office handles many outgoing and incoming calls daily. If we have not contacted you within 1 week about your results, please check your mychart to see if there is a message first and if not, then contact our office.  In helping with this matter, you help decrease call volume, and therefore allow [place] to be able to respond to patients needs more efficiently.   Acute office visits (sick visit):  An acute visit is intended for a new problem and are scheduled in shorter time  slots to allow schedule openings for patients with new problems. This is the appropriate visit to discuss a new problem. Problems will not be addressed by phone call or Echart message. Appointment is needed if requesting treatment. In order to provide you with excellent quality medical care with proper time for you to explain your problem, have an exam and receive treatment with instructions, these appointments should be limited to one new problem per visit. If you experience a new problem, in which you desire to be addressed, please  make an acute office visit, we save openings on the schedule to accommodate you. Please do not save your new problem for any other type of visit, let us take care of it properly and quickly for you.   Follow up visits:  Depending on your condition(s) your provider will need to see you routinely in order to provide you with quality care and prescribe medication(s). Most chronic conditions (Example: hypertension, Diabetes, depression/anxiety... etc), require visits a couple times a year. Your provider will instruct you on proper follow up for your personal medical conditions and history. Please make certain to make follow up appointments for your condition as instructed. Failing to do so could result in lapse in your medication treatment/refills. If you request a refill, and are overdue to be seen on a condition, we will always provide you with a 30 day script (once) to allow you time to schedule.    Medicare wellness (well visit): - we have a wonderful Nurse Maudie Mercury), that will meet with you and provide you will yearly medicare wellness visits. These visits should occur yearly (can not be scheduled less than 1 calendar year apart) and cover preventive health, immunizations, advance directives and screenings you are entitled to yearly through your medicare benefits. Do not miss out on your entitled benefits, this is when medicare will pay for these benefits to be ordered for you.  These are strongly encouraged by your provider and is the appropriate type of visit to make certain you are up to date with all preventive health benefits. If you have not had your medicare wellness exam in the last 12 months, please make certain to schedule one by calling the office and schedule your medicare wellness with Maudie Mercury as soon as possible.   Yearly physical (well visit):  - Adults are recommended to be seen yearly for physicals. Check with your insurance and date of your last physical, most insurances require one calendar  year between physicals. Physicals include all preventive health topics, screenings, medical exam and labs that are appropriate for gender/age and history. You may have fasting labs needed at this visit. This is a well visit (not a sick visit), new problems should not be covered during this visit (see acute visit).  - Pediatric patients are seen more frequently when they are younger. Your provider will advise you on well child visit timing that is appropriate for your their age. - This is not a medicare wellness visit. Medicare wellness exams do not have an exam portion to the visit. Some medicare companies allow for a physical, some do not allow a yearly physical. If your medicare allows a yearly physical you can schedule the medicare wellness with our nurse Maudie Mercury and have your physical with your provider after, on the same day. Please check with insurance for your full benefits.   Late Policy/No Shows:  - all new patients should arrive 15-30 minutes earlier than appointment to allow [place] time  to  obtain all personal  demographics,  insurance information and for you to complete office paperwork. - All established patients should arrive 10-15 minutes earlier than appointment time to update all information and be checked in .  - In our best efforts to run on time, if you are late for your appointment you will be asked to either reschedule or if able, we will work you back into the schedule. There will be a wait time to work you back in the schedule,  depending on availability.  - If you are unable to make it to your appointment as scheduled, please call 24 hours ahead of time to allow [place] to fill the time slot with someone else who needs to be seen. If you do not cancel your appointment ahead of time, you may be charged a no show fee.

## 2018-08-13 NOTE — Progress Notes (Signed)
.     Patient ID: Bruce Kelly, male  DOB: 09/01/1960, 58 y.o.   MRN: [id number] Patient Care Team    Relationship Specialty Notifications Start End  [name], DO PCP - General Family Medicine  11/21/16   [name], MD PCP - Electrophysiology Cardiology Admissions 12/24/17   [name]  11/22/16   Marvene Staff  Family Medicine  09/26/17   [name], MD Consulting Physician Cardiology  12/06/17     Chief Complaint  Patient presents with  . Diabetes    Fasting. No complaints     Subjective:  Bruce Kelly is a 58 y.o.  male present for  History of atrial fibrillation s/p ablation/hyperlipidemia: Reports he has been doing well since his ablation.  He is not no longer in need of amiodarone.  He does report compliance with Crestor. Lipids: 12/05/2017 total cholesterol 173, HDL 43, LDL 103, triglycerides 135 Osteopenia/vitamin D deficiency: Vitamin D 12/06/1998 1926.17.  DEXA thousand 13 with osteopenia.  Has DEXA scheduled through his rheumatologist. GERD: Stable on Prilosec 20 mg daily. Rheumatoid arthritis: She reports his RA stable.  Established with rheumatology.  Currently prescribed Simponi, methotrexate, folate and Mobic by rheumatology. Diabetes:  Patient reports compliance with meds.  Diagnosed with diabetes May 2018,prediabetic for some time. He has attended the nutrition classes. Patient denies dizziness, hyperglycemic or hypoglycemic events. Patient denies numbness, tingling in the extremities or nonhealing wounds of feet.  PNA series:Prevnar 1302/21/2019,Pneumovax completed today Flu shot: Flu shot completed2019(recommneded yearly) BMP: Within normal limits Urine microalbumin: Completed 08/29/2017-due next visit. Foot exam:Completed 08/29/2017-due next visit Eye exam: UTD 07/17/2018, [facility].  Depression screen [facility] 2/9 12/05/2017 04/15/2017 12/20/2016 11/21/2016  Decreased Interest 0 0 0 0  Down,  Depressed, Hopeless 0 0 0 0  PHQ - 2 Score 0 0 0 0   No flowsheet data found.    Fall Risk  12/05/2017 12/05/2017 12/20/2016 11/21/2016  Falls in the past year? No No No No   Immunization History  Administered Date(s) Administered  . Influenza,inj,Quad PF,6+ Mos 04/15/2017  . Influenza-Unspecified 07/28/2015  . Pneumococcal Conjugate-13 08/29/2017  . Tdap 12/05/2017    No exam data present  Past Medical History:  Diagnosis Date  . Closed right ankle fracture   . Colon polyp 2011   x3 polyps, pts reports 10 year follow up  . 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 ED (erectile dysfunction) 09/23/2017  . Elevated ferritin 2013   479 -->  return to normal  . Encounter for long-term (current) use of other medications 09/23/2017  . Erectile dysfunction 2013   Had been on Cialis  . GERD (gastroesophageal reflux disease) 2013  . Hyperlipidemia LDL goal <130   . Irregular heart rate 09/23/2017  . Neuropathy 09/23/2017  . On statin therapy 04/15/2017  . Osteopenia 2013   prior h/o of Chronic steroids for 10 years; was on Boniva for 6 months. Now takes calcium/vitamin D daily.  . Other and unspecified hyperlipidemia 09/23/2017  . Overweight (BMI 25.0-29.9) 11/22/2016  . Peripheral neuropathy 2009   Did not respond to gabapentin by prior records  . Prediabetes   . RA (rheumatoid arthritis) ([place]) 09/23/2017  . Rheumatoid arthritis ([place]) 1998   had been on chronic steroids for about 10 years  . Rheumatoid lung ([place])    pt reports having thoracentesis for rheumatoid lung many years ago.   Marland Kitchen  Thrombocytopenia ([place]) 2013   Mildly low at 130  . Type 2 diabetes mellitus without complication, without long-term current use of insulin ([place]) 11/22/2016  . Vitamin D deficiency 11/22/2016  . Wears glasses    [facility]   Allergies  Allergen Reactions  . Penicillins Other (See Comments)    Doesn't know  . Sulfa Antibiotics Other (See Comments)     Doesn't know   Past Surgical History:  Procedure Laterality Date  . ABLATION OF DYSRHYTHMIC FOCUS  02/07/2018  . ATRIAL FIBRILLATION ABLATION N/A 02/07/2018   Procedure: ATRIAL FIBRILLATION ABLATION;  Surgeon: [name], MD;  Location: [facility];  Service: Cardiovascular;  Laterality: N/A;  . COLONOSCOPY    . NO PAST SURGERIES     Family History  Problem Relation Age of Onset  . Arthritis Mother   . Diabetes Father   . Heart disease Father   . Arthritis Sister   . Diabetes Sister   . Alcohol abuse Brother   . Drug abuse Brother   . Breast cancer Paternal Aunt   . Early death Maternal Grandfather    Social History   Socioeconomic History  . Marital status: Married    Spouse name: Mickel Baas  . Number of children: Not on file  . Years of education: 49  . Highest education level: Not on file  Occupational History  . Not on file  Social Needs  . Financial resource strain: Not on file  . Food insecurity:    Worry: Not on file    Inability: Not on file  . Transportation needs:    Medical: Not on file    Non-medical: Not on file  Tobacco Use  . Smoking status: Former Smoker    Packs/day: 1.50    Years: 10.00    Pack years: 15.00    Types: Cigarettes    Last attempt to quit: 1999    Years since quitting: 21.1  . Smokeless tobacco: Never Used  Substance and Sexual Activity  . Alcohol use: Yes    Alcohol/week: 4.0 standard drinks    Types: 2 Cans of beer, 2 Shots of liquor per week  . Drug use: No  . Sexual activity: Yes    Partners: Female    Birth control/protection: Pill  Lifestyle  . Physical activity:    Days per week: Not on file    Minutes per session: Not on file  . Stress: Not on file  Relationships  . Social connections:    Talks on phone: Not on file    Gets together: Not on file    Attends religious service: Not on file    Active member of club or organization: Not on file    Attends meetings of clubs or organizations: Not on file     Relationship status: Not on file  . Intimate partner violence:    Fear of current or ex partner: Not on file    Emotionally abused: Not on file    Physically abused: Not on file    Forced sexual activity: Not on file  Other Topics Concern  . Not on file  Social History Narrative   Drinks caffeine, takes a daily vitamin.   Masters degree, Materials engineer.   Exercises routinely.   Wears his seatbelt, firearms locked in the home.   Feels  safe in his relationships.   Allergies as of 08/13/2018      Reactions   Penicillins Other (See Comments)   Doesn't know  Sulfa Antibiotics Other (See Comments)   Doesn't know      Medication List       Accurate as of August 13, 2018  8:18 AM. Always use your most recent med list.        b complex vitamins capsule Take 1 capsule by mouth daily.   blood glucose meter kit and supplies Dispense based on patient and insurance  Check blood sugar once daily.   calcium-vitamin D 500-200 MG-UNIT tablet Commonly known as:  OSCAL WITH D Take 1 tablet by mouth daily with breakfast.   folic acid 1 MG tablet Commonly known as:  FOLVITE Take 1 mg by mouth daily.   meloxicam 7.5 MG tablet Commonly known as:  MOBIC Take 2 tablets by mouth daily.   metFORMIN 500 MG tablet Commonly known as:  GLUCOPHAGE Take 1 tablet (500 mg total) by mouth 2 (two) times daily with a meal.   methotrexate 2.5 MG tablet Commonly known as:  RHEUMATREX Take 4 tablets by mouth once a week.   multivitamin capsule Take 1 capsule by mouth daily. Takes womens for extra calcium   omeprazole 20 MG capsule Commonly known as:  PRILOSEC Take 1 capsule (20 mg total) by mouth daily.   rosuvastatin 5 MG tablet Commonly known as:  CRESTOR 3 times week   SIMPONI 50 MG/0.5ML S[place] Generic drug:  Golimumab Inject 50 Units into the skin every 30 (thirty) days.       All past medical history, surgical history, allergies, family history, immunizations andmedications were  updated in the EMR today and reviewed under the history and medication portions of their EMR.    No results found for this or any previous visit (from the past 2160 hour(s)).  Patient was never admitted.   ROS: 14 pt review of systems performed and negative (unless mentioned in an HPI)  Objective: BP 113/77 (BP Location: Left Arm, Patient Position: Sitting, Cuff Size: Normal)   Pulse 75   Temp 98.4 F (36.9 C) (Oral)   Resp 16   Ht 6' (1.829 m)   Wt 205 lb 4 oz (93.1 kg)   SpO2 94%   BMI 27.84 kg/m  Gen: Afebrile. No acute distress.  Nontoxic in presentation.  Pleasant, overweight, Caucasian male. HENT: AT. [place].  MMM Eyes:Pupils Equal Round Reactive to light, Extraocular movements intact,  Conjunctiva without redness, discharge or icterus. Neck/lymp/endocrine: Supple, no lymphadenopathy, no thyromegaly CV: RRR no murmur, no edema, +2/4 P posterior tibialis pulses Chest: CTAB, no wheeze or crackles Abd: Soft. NTND. BS present.  No masses palpated.  Skin: No rashes, purpura or petechiae.  Neuro:  Normal gait. PERLA. EOMi. Alert. Oriented x3  Psych: Normal affect, dress and demeanor. Normal speech. Normal thought content and judgment.    No results found for this or any previous visit (from the past 24 hour(s)).  Assessment/plan: Bruce Kelly is a 58 y.o. male present for  Type 2 diabetes mellitus without complication, without long-term current use of insulin ([place]) -Stable.A1c 5.8-->6.4--> 6.1--> 6.1 again today collectedtoday. continue withmetformin500 mg twice a day. PNA series:Prevnar 1302/21/2019,Pneumovax completed today Flu shot: Flu shot completed2019(recommneded yearly) BMP: Within normal limits Urine microalbumin: Due next visit. Foot exam:Completed 08/29/2017-due  next visit Eye exam: 07/17/2018, [facility]. - POCT glycosylated hemoglobin (Hb A1C) - metFORMIN (GLUCOPHAGE) 500 MG tablet; Take 1 tablet (500 mg total) by mouth 2 (two) times daily with a  meal.  Dispense: 180 tablet; Refill: 1 - f/u 4 mos Hyperlipidemia LDL  goal <130/On statin therapy/Overweight (BMI 25.0-29.9)/Thrombocytopenia (HCC)/Status post ablation of atrial fibrillation - stable.  Resolution of atrial fibrillation after ablation. - Lipids: 12/05/2017 total cholesterol 173, HDL 43, LDL 103, triglycerides 135 - Continue Crestor-labs due next visit. - rosuvastatin (CRESTOR) 5 MG tablet; 3 times week  Dispense: 45 tablet; Refill: 1 Gastroesophageal reflux disease, esophagitis presence not specified -Stable - omeprazole (PRILOSEC) 20 MG capsule; Take 1 capsule (20 mg total) by mouth daily.  Dispense: 90 capsule; Refill: 3 Need for shingles vaccine - Varicella-zoster vaccine IM Need for 23-polyvalent pneumococcal polysaccharide vaccine - Pneumococcal polysaccharide vaccine 23-valent greater than or equal to 2yo subcutaneous/IM Rheumatoid arthritis with positive rheumatoid factor, involving unspecified site (HCC)/ High risk medication use -Rheumatoid arthritis is stable.  He is on a regimen of methotrexate, Simponi, folate and Mobic through his rheumatologist. Osteopenia, unspecified location/Vitamin D deficiency - Vitamin D 12/06/1998 26. DEXA  2013 with osteopenia.  Has DEXA scheduled through his rheumatologist.    Note is dictated utilizing voice recognition software. Although note has been proof read prior to signing, occasional typographical errors still can be missed. If any questions arise, please do not hesitate to call for verification.    Electronically signed by: Howard Pouch, DO [place]

## 2018-08-14 ENCOUNTER — Encounter: Payer: Self-pay | Admitting: Family Medicine

## 2018-08-14 DIAGNOSIS — Z8679 Personal history of other diseases of the circulatory system: Secondary | ICD-10-CM | POA: Insufficient documentation

## 2018-08-14 DIAGNOSIS — Z9889 Other specified postprocedural states: Secondary | ICD-10-CM

## 2018-08-19 ENCOUNTER — Telehealth: Payer: Self-pay | Admitting: Family Medicine

## 2018-08-19 NOTE — Telephone Encounter (Signed)
Copied from [place] [phone]. Topic: Quick Communication - Rx Refill/Question >> Aug 19, 2018  3:10 PM Reyne Dumas L wrote: Medication:  omeprazole (PRILOSEC) 20 MG capsule  Sharyn Lull from [employer].  States that this medication needs prior authorization.  Prior Josem Kaufmann number is [phone]. Sharyn Lull can be reached at [phone] with ref #[id number]

## 2018-08-27 NOTE — Telephone Encounter (Signed)
PA sent via covermymed on 08/27/18   Key: [id number]   Medication: omepraole   Dx: GERD - K21.9   Per Dr. Anitra Lauth pt has tried and failed: None    Waiting for response.

## 2018-08-27 NOTE — Telephone Encounter (Signed)
Called [facility].   Pts insurance will only cover 90 tab per 365 days for all medications in this class.   PA will need to be done.

## 2018-08-28 NOTE — Telephone Encounter (Signed)
Faxed Approval received from [place]

## 2018-09-01 ENCOUNTER — Ambulatory Visit: Payer: 59 | Admitting: Cardiology

## 2018-10-03 ENCOUNTER — Telehealth: Payer: Self-pay | Admitting: *Deleted

## 2018-10-03 NOTE — Telephone Encounter (Signed)
Called patient to let them know due to recent [place] and Health Department Protocols, we are not seeing patients in the office on Monday in [facility]. Patient states he has no issues or complaints.  He says he has felt fine since appointment in December and would prefer to reschedule this appointment and let someone else have this appointment if needed.  If he needs to keep this appointment WebEx is just fine.

## 2018-10-06 ENCOUNTER — Ambulatory Visit: Payer: BLUE CROSS/BLUE SHIELD | Admitting: Cardiology

## 2018-10-15 ENCOUNTER — Other Ambulatory Visit: Payer: BLUE CROSS/BLUE SHIELD

## 2018-10-22 NOTE — Telephone Encounter (Signed)
Called pt to offer him virtual visit.  Pt states he is feeling fine and feels visit would not be necessary at this time.  Pt prefers to wait until [place] pandemic has improved and safer time to venture out (pt mentions he is on immunosuppressants and he doesn't leave the house). Recall placed for August in [facility] office. Pt agreeable and appreciates my follow up.  He will call the office if issues begin prior to August.

## 2018-11-03 DIAGNOSIS — M150 Primary generalized (osteo)arthritis: Secondary | ICD-10-CM | POA: Diagnosis not present

## 2018-11-03 DIAGNOSIS — Z79899 Other long term (current) drug therapy: Secondary | ICD-10-CM | POA: Diagnosis not present

## 2018-11-03 DIAGNOSIS — M0579 Rheumatoid arthritis with rheumatoid factor of multiple sites without organ or systems involvement: Secondary | ICD-10-CM | POA: Diagnosis not present

## 2018-11-03 DIAGNOSIS — I499 Cardiac arrhythmia, unspecified: Secondary | ICD-10-CM | POA: Diagnosis not present

## 2018-11-06 DIAGNOSIS — M0579 Rheumatoid arthritis with rheumatoid factor of multiple sites without organ or systems involvement: Secondary | ICD-10-CM | POA: Diagnosis not present

## 2018-11-26 ENCOUNTER — Other Ambulatory Visit: Payer: BLUE CROSS/BLUE SHIELD

## 2018-12-04 ENCOUNTER — Ambulatory Visit: Payer: Self-pay | Admitting: Family Medicine

## 2019-01-19 ENCOUNTER — Telehealth: Payer: Self-pay

## 2019-01-19 NOTE — Telephone Encounter (Signed)
lmom to call back. Patient needs to schedule appt with Dr. Raoul Pitch. He is still in window to get Shringrix #2 vaccine.

## 2019-01-19 NOTE — Telephone Encounter (Signed)
Copied from [place] [phone]. Topic: Appointment Scheduling - Scheduling Inquiry for Clinic >> Jan 19, 2019 10:02 AM Edmonia Caprio wrote: Reason for CRM: Patient was scheduled to come back in May for follow up and Shringrix #2. He is inquiring to see if he still in the window to get #2 injection. >> Jan 19, 2019 11:34 AM Caroll Rancher, LPN wrote: Yes, please schedule patient

## 2019-01-20 ENCOUNTER — Ambulatory Visit (INDEPENDENT_AMBULATORY_CARE_PROVIDER_SITE_OTHER): Payer: BC Managed Care – PPO | Admitting: Family Medicine

## 2019-01-20 ENCOUNTER — Encounter: Payer: Self-pay | Admitting: Family Medicine

## 2019-01-20 ENCOUNTER — Ambulatory Visit: Payer: Self-pay | Admitting: Family Medicine

## 2019-01-20 ENCOUNTER — Other Ambulatory Visit: Payer: Self-pay

## 2019-01-20 VITALS — BP 109/72 | HR 63 | Temp 97.7°F | Resp 17 | Ht 72.0 in | Wt 200.2 lb

## 2019-01-20 DIAGNOSIS — Z23 Encounter for immunization: Secondary | ICD-10-CM

## 2019-01-20 DIAGNOSIS — K219 Gastro-esophageal reflux disease without esophagitis: Secondary | ICD-10-CM

## 2019-01-20 DIAGNOSIS — E119 Type 2 diabetes mellitus without complications: Secondary | ICD-10-CM

## 2019-01-20 DIAGNOSIS — E785 Hyperlipidemia, unspecified: Secondary | ICD-10-CM | POA: Diagnosis not present

## 2019-01-20 DIAGNOSIS — E663 Overweight: Secondary | ICD-10-CM

## 2019-01-20 DIAGNOSIS — Z79899 Other long term (current) drug therapy: Secondary | ICD-10-CM | POA: Diagnosis not present

## 2019-01-20 LAB — POCT GLYCOSYLATED HEMOGLOBIN (HGB A1C)
HbA1c POC (<> result, manual entry): 6.2 % (ref 4.0–5.6)
HbA1c, POC (controlled diabetic range): 6.2 % (ref 0.0–7.0)
HbA1c, POC (prediabetic range): 6.2 % (ref 5.7–6.4)
Hemoglobin A1C: 6.2 % — AB (ref 4.0–5.6)

## 2019-01-20 MED ORDER — METFORMIN HCL 500 MG PO TABS: 500.0000 mg | ORAL_TABLET | Freq: Two times a day (BID) | ORAL | 1 refills | Status: DC

## 2019-01-20 MED ORDER — ROSUVASTATIN CALCIUM 5 MG PO TABS: ORAL_TABLET | ORAL | 1 refills | Status: DC

## 2019-01-20 NOTE — Progress Notes (Signed)
.     Patient ID: Bruce Kelly, male  DOB: [birth year], 58 y.o.   MRN: [id number] Patient Care Team    Relationship Specialty Notifications Start End  [name], DO PCP - General Family Medicine  11/21/16   [name], MD PCP - Electrophysiology Cardiology Admissions 12/24/17   [name]  11/22/16   Marvene Staff  Family Medicine  09/26/17   [name], MD Consulting Physician Cardiology  12/06/17     Chief Complaint  Patient presents with  . Diabetes    2nd shingrix shot.     Subjective:  Bruce Kelly is a 58 y.o.  male present for  History of atrial fibrillation s/p ablation/hyperlipidemia: Reports he has been doing well since his ablation.  He is not no longer in need of amiodarone.  He does report compliance with Crestor. Lipids: 12/05/2017 total cholesterol 173, HDL 43, LDL 103, triglycerides 135  Osteopenia/vitamin D deficiency:   DEXA  2013 with osteopenia.  Has DEXA scheduled later this month.  GERD: Stable on Prilosec 20 mg daily.  Rheumatoid arthritis: He reports his RA stable.  Established with rheumatology.  Currently prescribed Simponi, methotrexate, folate and Mobic by rheumatology.  Diabetes:  Patient reports compliance with meds.  Diagnosed with diabetes May 2018,prediabetic for some time. He has attended the nutrition classes. Patient denies dizziness, hyperglycemic or hypoglycemic events. Patient denies numbness, tingling in the extremities or nonhealing wounds of feet.   PNA series:Prevnar 1302/21/2019,Pneumovax completed 08/13/2018 Flu shot: Flu shot completed2019(recommneded yearly) BMP: Within normal limits Urine microalbumin: Completed 08/29/2017-due next visit. Foot exam:Completed 01/20/2019 Eye exam: UTD 07/17/2018, [facility].  Depression screen [facility] 2/9 12/05/2017 04/15/2017 12/20/2016 11/21/2016  Decreased Interest 0 0 0 0  Down, Depressed, Hopeless 0 0 0 0  PHQ - 2 Score 0 0 0 0    No flowsheet data found.    Fall Risk  12/05/2017 12/05/2017 12/20/2016 11/21/2016  Falls in the past year? No No No No   Immunization History  Administered Date(s) Administered  . Influenza,inj,Quad PF,6+ Mos 04/15/2017  . Influenza-Unspecified 07/28/2015, 03/19/2018  . Pneumococcal Conjugate-13 08/29/2017  . Pneumococcal Polysaccharide-23 08/13/2018  . Tdap 12/05/2017  . Zoster Recombinat (Shingrix) 08/13/2018    No exam data present  Past Medical History:  Diagnosis Date  . Closed right ankle fracture   . Colon polyp 2011   x3 polyps, pts reports 10 year follow up  . 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 ED (erectile dysfunction) 09/23/2017  . Elevated ferritin 2013   479 -->  return to normal  . Encounter for long-term (current) use of other medications 09/23/2017  . Erectile dysfunction 2013   Had been on Cialis  . GERD (gastroesophageal reflux disease) 2013  . Hyperlipidemia LDL goal <130   . Irregular heart rate 09/23/2017  . Neuropathy 09/23/2017  . On statin therapy 04/15/2017  . Osteopenia 2013   prior h/o of Chronic steroids for 10 years; was on Boniva for 6 months. Now takes calcium/vitamin D daily.  . Other and unspecified hyperlipidemia 09/23/2017  . Overweight (BMI 25.0-29.9) 11/22/2016  . Peripheral neuropathy 2009   Did not respond to gabapentin by prior records  . Prediabetes   . RA (rheumatoid arthritis) ([place]) 09/23/2017  . Rheumatoid arthritis ([place]) 1998   had been on chronic steroids for about 10 years  . Rheumatoid lung ([place])  pt reports having thoracentesis for rheumatoid lung many years ago.   . Thrombocytopenia ([place]) 2013   Mildly low at 130  . Type 2 diabetes mellitus without complication, without long-term current use of insulin ([place]) 11/22/2016  . Vitamin D deficiency 11/22/2016  . Wears glasses    [facility]   Allergies  Allergen Reactions  . Penicillins Other (See Comments)    Doesn't know   . Sulfa Antibiotics Other (See Comments)    Doesn't know   Past Surgical History:  Procedure Laterality Date  . ABLATION OF DYSRHYTHMIC FOCUS  02/07/2018  . ATRIAL FIBRILLATION ABLATION N/A 02/07/2018   Procedure: ATRIAL FIBRILLATION ABLATION;  Surgeon: [name], MD;  Location: [facility];  Service: Cardiovascular;  Laterality: N/A;  . COLONOSCOPY    . NO PAST SURGERIES     Family History  Problem Relation Age of Onset  . Arthritis Mother   . Diabetes Father   . Heart disease Father   . Arthritis Sister   . Diabetes Sister   . Alcohol abuse Brother   . Drug abuse Brother   . Breast cancer Paternal Aunt   . Early death Maternal Grandfather    Social History   Socioeconomic History  . Marital status: Married    Spouse name: Mickel Baas  . Number of children: Not on file  . Years of education: 40  . Highest education level: Not on file  Occupational History  . Not on file  Social Needs  . Financial resource strain: Not on file  . Food insecurity    Worry: Not on file    Inability: Not on file  . Transportation needs    Medical: Not on file    Non-medical: Not on file  Tobacco Use  . Smoking status: Former Smoker    Packs/day: 1.50    Years: 10.00    Pack years: 15.00    Types: Cigarettes    Quit date: 1999    Years since quitting: 21.5  . Smokeless tobacco: Never Used  Substance and Sexual Activity  . Alcohol use: Yes    Alcohol/week: 4.0 standard drinks    Types: 2 Cans of beer, 2 Shots of liquor per week  . Drug use: No  . Sexual activity: Yes    Partners: Female    Birth control/protection: Pill  Lifestyle  . Physical activity    Days per week: Not on file    Minutes per session: Not on file  . Stress: Not on file  Relationships  . Social Herbalist on phone: Not on file    Gets together: Not on file    Attends religious service: Not on file    Active member of club or organization: Not on file    Attends meetings of clubs or  organizations: Not on file    Relationship status: Not on file  . Intimate partner violence    Fear of current or ex partner: Not on file    Emotionally abused: Not on file    Physically abused: Not on file    Forced sexual activity: Not on file  Other Topics Concern  . Not on file  Social History Narrative   Drinks caffeine, takes a daily vitamin.   Masters degree, Materials engineer.   Exercises routinely.   Wears his seatbelt, firearms locked in the home.   Feels  safe in his relationships.   Allergies as of 01/20/2019  Reactions   Penicillins Other (See Comments)   Doesn't know   Sulfa Antibiotics Other (See Comments)   Doesn't know      Medication List       Accurate as of January 20, 2019 11:45 AM. If you have any questions, ask your nurse or doctor.        b complex vitamins capsule Take 1 capsule by mouth daily.   calcium-vitamin D 500-200 MG-UNIT tablet Commonly known as: OSCAL WITH D Take 1 tablet by mouth daily with breakfast.   folic acid 1 MG tablet Commonly known as: FOLVITE Take 1 mg by mouth daily.   meloxicam 7.5 MG tablet Commonly known as: MOBIC Take 2 tablets by mouth daily.   metFORMIN 500 MG tablet Commonly known as: GLUCOPHAGE Take 1 tablet (500 mg total) by mouth 2 (two) times daily with a meal.   methotrexate 2.5 MG tablet Commonly known as: RHEUMATREX Take 4 tablets by mouth once a week.   multivitamin capsule Take 1 capsule by mouth daily. Takes womens for extra calcium   omeprazole 20 MG capsule Commonly known as: PRILOSEC Take 1 capsule (20 mg total) by mouth daily.   rosuvastatin 5 MG tablet Commonly known as: CRESTOR 3 times week   Simponi 50 MG/0.5ML Sosy Generic drug: Golimumab Inject 50 Units into the skin every 30 (thirty) days.       All past medical history, surgical history, allergies, family history, immunizations andmedications were updated in the EMR today and reviewed under the history and medication portions of  their EMR.    Recent Results (from the past 2160 hour(s))  POCT glycosylated hemoglobin (Hb A1C)     Status: Abnormal   Collection Time: 01/20/19 11:44 AM  Result Value Ref Range   Hemoglobin A1C 6.2 (A) 4.0 - 5.6 %   HbA1c POC (<> result, manual entry) 6.2 4.0 - 5.6 %   HbA1c, POC (prediabetic range) 6.2 5.7 - 6.4 %   HbA1c, POC (controlled diabetic range) 6.2 0.0 - 7.0 %    Patient was never admitted.   ROS: 14 pt review of systems performed and negative (unless mentioned in an HPI)  Objective: BP 109/72 (BP Location: Left Arm, Patient Position: Sitting, Cuff Size: Normal)   Pulse 63   Temp 97.7 F (36.5 C) (Temporal)   Resp 17   Ht 6' (1.829 m)   Wt 200 lb 4 oz (90.8 kg)   SpO2 98%   BMI 27.16 kg/m  Gen: Afebrile. No acute distress.  Nontoxic complaint of sedation well-developed, well-nourished, mildly overweight Caucasian male. HENT: AT. [place].  MMM.  Eyes:Pupils Equal Round Reactive to light, Extraocular movements intact,  Conjunctiva without redness, discharge or icterus. Neck/lymp/endocrine: Supple, no lymphadenopathy, no thyromegaly CV: RRR no murmur, no edema, +2/4 P posterior tibialis pulses Chest: CTAB, no wheeze or crackles Abd: Soft. NTND. BS present.  Skin: No rashes, purpura or petechiae.  Neuro:  Normal gait. PERLA. EOMi. Alert. Oriented.  Psych: Normal affect, dress and demeanor. Normal speech. Normal thought content and judgment. Diabetic Foot Exam - Simple   Simple Foot Form Diabetic Foot exam was performed with the following findings: Yes 01/20/2019  3:30 PM  Visual Inspection No deformities, no ulcerations, no other skin breakdown bilaterally: Yes Sensation Testing Intact to touch and monofilament testing bilaterally: Yes Pulse Check Posterior Tibialis and Dorsalis pulse intact bilaterally: Yes Comments Mild neuropathy which is chronic.  2 small callus formations medial large toes.     Results for orders  placed or performed in visit on 01/20/19  (from the past 24 hour(s))  POCT glycosylated hemoglobin (Hb A1C)     Status: Abnormal   Collection Time: 01/20/19 11:44 AM  Result Value Ref Range   Hemoglobin A1C 6.2 (A) 4.0 - 5.6 %   HbA1c POC (<> result, manual entry) 6.2 4.0 - 5.6 %   HbA1c, POC (prediabetic range) 6.2 5.7 - 6.4 %   HbA1c, POC (controlled diabetic range) 6.2 0.0 - 7.0 %    Assessment/plan: QUINTEZ MASELLI is a 58 y.o. male present for  Type 2 diabetes mellitus without complication, without long-term current use of insulin ([place]) -Stable.A1c 5.8-->6.4--> 6.1--> 6.1>>6.2 today. continue withmetformin500 mg twice a day. PNA series:Prevnar 1302/21/2019,Pneumovax completed 08/13/2018 Flu shot: Flu shot completed2019(recommneded yearly) BMP: Within normal limits Urine microalbumin: Completed 08/29/2017-due next visit. Foot exam:Completed 01/20/2019 Eye exam: UTD 07/17/2018, [facility]. - f/u 4 mos for CPE Hyperlipidemia LDL goal <130/On statin therapy/Overweight (BMI 25.0-29.9)/Thrombocytopenia (HCC)/Status post ablation of atrial fibrillation -Stable.  Resolution of atrial fibrillation after ablation. - Lipids: 12/05/2017 total cholesterol 173, HDL 43, LDL 103, triglycerides 135 - Continue Crestor-labs due next visit. - rosuvastatin (CRESTOR) 5 MG tablet; 3 times week  Dispense: 45 tablet; Refill: 1 Gastroesophageal reflux disease, esophagitis presence not specified -Stable - omeprazole (PRILOSEC) 20 MG capsule; Take 1 capsule (20 mg total) by mouth daily.  Dispense: 90 capsule; Refill: 3  Rheumatoid arthritis with positive rheumatoid factor, involving unspecified site (HCC)/ High risk medication use -Rheumatoid arthritis is stable.  He is on a regimen of methotrexate, Simponi, folate and Mobic through his rheumatologist. Osteopenia, unspecified location/Vitamin D deficiency - Vitamin D 12/06/1998 26. DEXA  2013 with osteopenia.  Has DEXA scheduled through his rheumatologist.  Shingrix No. 2 provided today.   Note is dictated utilizing voice recognition software. Although note has been proof read prior to signing, occasional typographical errors still can be missed. If any questions arise, please do not hesitate to call for verification.   > 25 minutes spent with patient, >50% of time spent face to face    Electronically signed by: Bruce Pouch, [place]

## 2019-01-20 NOTE — Telephone Encounter (Signed)
Scheduled appt on 7/14

## 2019-01-20 NOTE — Patient Instructions (Signed)
It was great to see you today! Follow up in 4 mos with a physical and we will get all your labs then.  A1c 6.2 today.    Diabetes Mellitus and Exercise Exercising regularly is important for your overall health, especially when you have diabetes (diabetes mellitus). Exercising is not only about losing weight. It has many other health benefits, such as increasing muscle strength and bone density and reducing body fat and stress. This leads to improved fitness, flexibility, and endurance, all of which result in better overall health. Exercise has additional benefits for people with diabetes, including:  Reducing appetite.  Helping to lower and control blood glucose.  Lowering blood pressure.  Helping to control amounts of fatty substances (lipids) in the blood, such as cholesterol and triglycerides.  Helping the body to respond better to insulin (improving insulin sensitivity).  Reducing how much insulin the body needs.  Decreasing the risk for heart disease by: ? Lowering cholesterol and triglyceride levels. ? Increasing the levels of good cholesterol. ? Lowering blood glucose levels. What is my activity plan? Your health care provider or certified diabetes educator can help you make a plan for the type and frequency of exercise (activity plan) that works for you. Make sure that you:  Do at least 150 minutes of moderate-intensity or vigorous-intensity exercise each week. This could be brisk walking, biking, or water aerobics. ? Do stretching and strength exercises, such as yoga or weightlifting, at least 2 times a week. ? Spread out your activity over at least 3 days of the week.  Get some form of physical activity every day. ? Do not go more than 2 days in a row without some kind of physical activity. ? Avoid being inactive for more than 30 minutes at a time. Take frequent breaks to walk or stretch.  Choose a type of exercise or activity that you enjoy, and set realistic goals.   Start slowly, and gradually increase the intensity of your exercise over time. What do I need to know about managing my diabetes?   Check your blood glucose before and after exercising. ? If your blood glucose is 240 mg/dL (13.3 mmol/L) or higher before you exercise, check your urine for ketones. If you have ketones in your urine, do not exercise until your blood glucose returns to normal. ? If your blood glucose is 100 mg/dL (5.6 mmol/L) or lower, eat a snack containing 15-20 grams of carbohydrate. Check your blood glucose 15 minutes after the snack to make sure that your level is above 100 mg/dL (5.6 mmol/L) before you start your exercise.  Know the symptoms of low blood glucose (hypoglycemia) and how to treat it. Your risk for hypoglycemia increases during and after exercise. Common symptoms of hypoglycemia can include: ? Hunger. ? Anxiety. ? Sweating and feeling clammy. ? Confusion. ? Dizziness or feeling light-headed. ? Increased heart rate or palpitations. ? Blurry vision. ? Tingling or numbness around the mouth, lips, or tongue. ? Tremors or shakes. ? Irritability.  Keep a rapid-acting carbohydrate snack available before, during, and after exercise to help prevent or treat hypoglycemia.  Avoid injecting insulin into areas of the body that are going to be exercised. For example, avoid injecting insulin into: ? The arms, when playing tennis. ? The legs, when jogging.  Keep records of your exercise habits. Doing this can help you and your health care provider adjust your diabetes management plan as needed. Write down: ? Food that you eat before and after you exercise. ?  Blood glucose levels before and after you exercise. ? The type and amount of exercise you have done. ? When your insulin is expected to peak, if you use insulin. Avoid exercising at times when your insulin is peaking.  When you start a new exercise or activity, work with your health care provider to make sure the  activity is safe for you, and to adjust your insulin, medicines, or food intake as needed.  Drink plenty of water while you exercise to prevent dehydration or heat stroke. Drink enough fluid to keep your urine clear or pale yellow. Summary  Exercising regularly is important for your overall health, especially when you have diabetes (diabetes mellitus).  Exercising has many health benefits, such as increasing muscle strength and bone density and reducing body fat and stress.  Your health care provider or certified diabetes educator can help you make a plan for the type and frequency of exercise (activity plan) that works for you.  When you start a new exercise or activity, work with your health care provider to make sure the activity is safe for you, and to adjust your insulin, medicines, or food intake as needed. This information is not intended to replace advice given to you by your health care provider. Make sure you discuss any questions you have with your health care provider. Document Released: 09/15/2003 Document Revised: 01/17/2017 Document Reviewed: 12/05/2015 Elsevier Patient Education  2020 [employer].

## 2019-01-21 ENCOUNTER — Encounter: Payer: Self-pay | Admitting: Family Medicine

## 2019-01-28 ENCOUNTER — Other Ambulatory Visit: Payer: Self-pay

## 2019-01-28 ENCOUNTER — Ambulatory Visit
Admission: RE | Admit: 2019-01-28 | Discharge: 2019-01-28 | Disposition: A | Discharge status: Home / Self Care | Payer: BC Managed Care – PPO | Source: Ambulatory Visit | Attending: Physician Assistant | Admitting: Physician Assistant

## 2019-01-28 DIAGNOSIS — M8589 Other specified disorders of bone density and structure, multiple sites: Secondary | ICD-10-CM

## 2019-01-28 DIAGNOSIS — M85852 Other specified disorders of bone density and structure, left thigh: Secondary | ICD-10-CM | POA: Diagnosis not present

## 2019-02-04 DIAGNOSIS — Z79899 Other long term (current) drug therapy: Secondary | ICD-10-CM | POA: Diagnosis not present

## 2019-02-04 DIAGNOSIS — M0579 Rheumatoid arthritis with rheumatoid factor of multiple sites without organ or systems involvement: Secondary | ICD-10-CM | POA: Diagnosis not present

## 2019-02-25 DIAGNOSIS — M150 Primary generalized (osteo)arthritis: Secondary | ICD-10-CM | POA: Diagnosis not present

## 2019-02-25 DIAGNOSIS — Z79899 Other long term (current) drug therapy: Secondary | ICD-10-CM | POA: Diagnosis not present

## 2019-02-25 DIAGNOSIS — M0579 Rheumatoid arthritis with rheumatoid factor of multiple sites without organ or systems involvement: Secondary | ICD-10-CM | POA: Diagnosis not present

## 2019-02-25 DIAGNOSIS — I499 Cardiac arrhythmia, unspecified: Secondary | ICD-10-CM | POA: Diagnosis not present

## 2019-04-27 ENCOUNTER — Other Ambulatory Visit: Payer: Self-pay | Admitting: Family Medicine

## 2019-04-27 DIAGNOSIS — E119 Type 2 diabetes mellitus without complications: Secondary | ICD-10-CM

## 2019-04-27 DIAGNOSIS — E785 Hyperlipidemia, unspecified: Secondary | ICD-10-CM

## 2019-04-27 DIAGNOSIS — K219 Gastro-esophageal reflux disease without esophagitis: Secondary | ICD-10-CM

## 2019-05-19 DIAGNOSIS — Z79899 Other long term (current) drug therapy: Secondary | ICD-10-CM | POA: Diagnosis not present

## 2019-05-19 DIAGNOSIS — I499 Cardiac arrhythmia, unspecified: Secondary | ICD-10-CM | POA: Diagnosis not present

## 2019-05-19 DIAGNOSIS — M0579 Rheumatoid arthritis with rheumatoid factor of multiple sites without organ or systems involvement: Secondary | ICD-10-CM | POA: Diagnosis not present

## 2019-05-19 DIAGNOSIS — M150 Primary generalized (osteo)arthritis: Secondary | ICD-10-CM | POA: Diagnosis not present

## 2019-05-19 DIAGNOSIS — Z23 Encounter for immunization: Secondary | ICD-10-CM | POA: Diagnosis not present

## 2019-05-19 LAB — BASIC METABOLIC PANEL
BUN: 17 (ref 4–21)
Chloride: 102 (ref 99–108)
Creatinine: 0.8 (ref 0.6–1.3)
Glucose: 154
Potassium: 5 (ref 3.4–5.3)
Sodium: 140 (ref 137–147)

## 2019-05-19 LAB — HEPATIC FUNCTION PANEL
ALT: 28 (ref 10–40)
AST: 27 (ref 14–40)
Alkaline Phosphatase: 88 (ref 25–125)
Bilirubin, Total: 0.5

## 2019-05-19 LAB — CBC AND DIFFERENTIAL
HCT: 45 (ref 41–53)
Hemoglobin: 15.6 (ref 13.5–17.5)
Platelets: 139 — AB (ref 150–399)
WBC: 6

## 2019-05-19 LAB — COMPREHENSIVE METABOLIC PANEL: Calcium: 9.4 (ref 8.7–10.7)

## 2019-05-19 LAB — CBC: RBC: 4.97 (ref 3.87–5.11)

## 2019-05-22 ENCOUNTER — Encounter: Payer: Self-pay | Admitting: Family Medicine

## 2019-07-15 ENCOUNTER — Other Ambulatory Visit: Payer: Self-pay | Admitting: Family Medicine

## 2019-07-15 DIAGNOSIS — E119 Type 2 diabetes mellitus without complications: Secondary | ICD-10-CM

## 2019-07-15 DIAGNOSIS — E785 Hyperlipidemia, unspecified: Secondary | ICD-10-CM

## 2019-08-20 DIAGNOSIS — Z79899 Other long term (current) drug therapy: Secondary | ICD-10-CM | POA: Diagnosis not present

## 2019-08-20 DIAGNOSIS — M0579 Rheumatoid arthritis with rheumatoid factor of multiple sites without organ or systems involvement: Secondary | ICD-10-CM | POA: Diagnosis not present

## 2019-11-17 DIAGNOSIS — Z79899 Other long term (current) drug therapy: Secondary | ICD-10-CM | POA: Diagnosis not present

## 2019-11-17 DIAGNOSIS — I499 Cardiac arrhythmia, unspecified: Secondary | ICD-10-CM | POA: Diagnosis not present

## 2019-11-17 DIAGNOSIS — M0579 Rheumatoid arthritis with rheumatoid factor of multiple sites without organ or systems involvement: Secondary | ICD-10-CM | POA: Diagnosis not present

## 2019-11-17 DIAGNOSIS — M150 Primary generalized (osteo)arthritis: Secondary | ICD-10-CM | POA: Diagnosis not present

## 2019-12-21 ENCOUNTER — Other Ambulatory Visit: Payer: Self-pay | Admitting: *Deleted

## 2019-12-21 DIAGNOSIS — K219 Gastro-esophageal reflux disease without esophagitis: Secondary | ICD-10-CM

## 2019-12-21 MED ORDER — OMEPRAZOLE 20 MG PO CPDR: 20.0000 mg | DELAYED_RELEASE_CAPSULE | Freq: Every day | ORAL | 0 refills | Status: DC

## 2020-01-09 ENCOUNTER — Other Ambulatory Visit: Payer: Self-pay | Admitting: Family Medicine

## 2020-01-09 DIAGNOSIS — E119 Type 2 diabetes mellitus without complications: Secondary | ICD-10-CM

## 2020-01-12 NOTE — Telephone Encounter (Signed)
Patient over due for CPE and 4 month follow up.  Contacted patient, he is scheduled for CPE 02/04/20.  Advised patient I could only send in 30 day supply, he voiced understanding and requested it still be sent to [facility]. Rx sent # [phone] x 0 refill.

## 2020-01-22 ENCOUNTER — Other Ambulatory Visit: Payer: Self-pay

## 2020-01-22 ENCOUNTER — Other Ambulatory Visit: Payer: Self-pay | Admitting: Family Medicine

## 2020-01-22 DIAGNOSIS — E785 Hyperlipidemia, unspecified: Secondary | ICD-10-CM

## 2020-01-22 MED ORDER — ROSUVASTATIN CALCIUM 5 MG PO TABS: ORAL_TABLET | ORAL | 0 refills | Status: DC

## 2020-02-01 ENCOUNTER — Other Ambulatory Visit: Payer: Self-pay | Admitting: Family Medicine

## 2020-02-01 ENCOUNTER — Other Ambulatory Visit: Payer: Self-pay

## 2020-02-01 DIAGNOSIS — E119 Type 2 diabetes mellitus without complications: Secondary | ICD-10-CM

## 2020-02-01 MED ORDER — METFORMIN HCL 500 MG PO TABS: 500.0000 mg | ORAL_TABLET | Freq: Two times a day (BID) | ORAL | 0 refills | Status: DC

## 2020-02-04 ENCOUNTER — Telehealth: Payer: Self-pay | Admitting: Family Medicine

## 2020-02-04 ENCOUNTER — Other Ambulatory Visit: Payer: Self-pay

## 2020-02-04 ENCOUNTER — Encounter: Payer: Self-pay | Admitting: Family Medicine

## 2020-02-04 ENCOUNTER — Ambulatory Visit (INDEPENDENT_AMBULATORY_CARE_PROVIDER_SITE_OTHER): Payer: BC Managed Care – PPO | Admitting: Family Medicine

## 2020-02-04 VITALS — BP 129/82 | HR 52 | Temp 98.0°F | Resp 16 | Ht 72.5 in | Wt 206.2 lb

## 2020-02-04 DIAGNOSIS — E119 Type 2 diabetes mellitus without complications: Secondary | ICD-10-CM | POA: Diagnosis not present

## 2020-02-04 DIAGNOSIS — G629 Polyneuropathy, unspecified: Secondary | ICD-10-CM

## 2020-02-04 DIAGNOSIS — M8580 Other specified disorders of bone density and structure, unspecified site: Secondary | ICD-10-CM | POA: Diagnosis not present

## 2020-02-04 DIAGNOSIS — E559 Vitamin D deficiency, unspecified: Secondary | ICD-10-CM | POA: Diagnosis not present

## 2020-02-04 DIAGNOSIS — E785 Hyperlipidemia, unspecified: Secondary | ICD-10-CM

## 2020-02-04 DIAGNOSIS — E663 Overweight: Secondary | ICD-10-CM

## 2020-02-04 DIAGNOSIS — Z79899 Other long term (current) drug therapy: Secondary | ICD-10-CM | POA: Diagnosis not present

## 2020-02-04 DIAGNOSIS — Z125 Encounter for screening for malignant neoplasm of prostate: Secondary | ICD-10-CM

## 2020-02-04 DIAGNOSIS — Z1211 Encounter for screening for malignant neoplasm of colon: Secondary | ICD-10-CM

## 2020-02-04 DIAGNOSIS — Z0000 Encounter for general adult medical examination without abnormal findings: Secondary | ICD-10-CM | POA: Diagnosis not present

## 2020-02-04 DIAGNOSIS — K219 Gastro-esophageal reflux disease without esophagitis: Secondary | ICD-10-CM | POA: Diagnosis not present

## 2020-02-04 DIAGNOSIS — K635 Polyp of colon: Secondary | ICD-10-CM

## 2020-02-04 DIAGNOSIS — Z8679 Personal history of other diseases of the circulatory system: Secondary | ICD-10-CM | POA: Diagnosis not present

## 2020-02-04 DIAGNOSIS — Z9889 Other specified postprocedural states: Secondary | ICD-10-CM | POA: Diagnosis not present

## 2020-02-04 DIAGNOSIS — M0579 Rheumatoid arthritis with rheumatoid factor of multiple sites without organ or systems involvement: Secondary | ICD-10-CM

## 2020-02-04 LAB — COMPREHENSIVE METABOLIC PANEL
ALT: 33 U/L (ref 0–53)
AST: 28 U/L (ref 0–37)
Albumin: 4.1 g/dL (ref 3.5–5.2)
Alkaline Phosphatase: 72 U/L (ref 39–117)
BUN: 15 mg/dL (ref 6–23)
CO2: 29 mEq/L (ref 19–32)
Calcium: 9.5 mg/dL (ref 8.4–10.5)
Chloride: 103 mEq/L (ref 96–112)
Creatinine, Ser: 0.92 mg/dL (ref 0.40–1.50)
GFR: 83.76 mL/min (ref >60.00)
Glucose, Bld: 149 mg/dL — ABNORMAL HIGH (ref 70–99)
Potassium: 4.2 mEq/L (ref 3.5–5.1)
Sodium: 137 mEq/L (ref 135–145)
Total Bilirubin: 0.7 mg/dL (ref 0.2–1.2)
Total Protein: 7 g/dL (ref 6.0–8.3)

## 2020-02-04 LAB — PSA: PSA: 0.41 ng/mL (ref 0.10–4.00)

## 2020-02-04 LAB — LIPID PANEL
Cholesterol: 167 mg/dL (ref 0–200)
HDL: 45.1 mg/dL (ref >39.00)
LDL Cholesterol: 89 mg/dL (ref 0–99)
NonHDL: 122.21
Total CHOL/HDL Ratio: 4
Triglycerides: 164 mg/dL — ABNORMAL HIGH (ref 0.0–149.0)
VLDL: 32.8 mg/dL (ref 0.0–40.0)

## 2020-02-04 LAB — CBC
HCT: 45.2 % (ref 39.0–52.0)
Hemoglobin: 15.3 g/dL (ref 13.0–17.0)
MCHC: 33.9 g/dL (ref 30.0–36.0)
MCV: 92.9 fl (ref 78.0–100.0)
Platelets: 142 10*3/uL — ABNORMAL LOW (ref 150.0–400.0)
RBC: 4.87 Mil/uL (ref 4.22–5.81)
RDW: 13.7 % (ref 11.5–15.5)
WBC: 5.6 10*3/uL (ref 4.0–10.5)

## 2020-02-04 LAB — VITAMIN D 25 HYDROXY (VIT D DEFICIENCY, FRACTURES): VITD: 31.85 ng/mL (ref 30.00–100.00)

## 2020-02-04 LAB — MICROALBUMIN / CREATININE URINE RATIO
Creatinine,U: 44.7 mg/dL
Microalb Creat Ratio: 1.6 mg/g (ref 0.0–30.0)
Microalb, Ur: <0.7 mg/dL (ref 0.0–1.9)

## 2020-02-04 LAB — HEMOGLOBIN A1C: Hgb A1c MFr Bld: 7.2 % — ABNORMAL HIGH (ref 4.6–6.5)

## 2020-02-04 LAB — TSH: TSH: 1.65 u[IU]/mL (ref 0.35–4.50)

## 2020-02-04 LAB — B12 AND FOLATE PANEL
Folate: >24.8 ng/mL (ref >5.9)
Vitamin B-12: 1258 pg/mL — ABNORMAL HIGH (ref 211–911)

## 2020-02-04 MED ORDER — OMEPRAZOLE 20 MG PO CPDR: 20.0000 mg | DELAYED_RELEASE_CAPSULE | Freq: Every day | ORAL | 1 refills | Status: DC

## 2020-02-04 MED ORDER — ROSUVASTATIN CALCIUM 5 MG PO TABS: ORAL_TABLET | ORAL | 3 refills | Status: DC

## 2020-02-04 MED ORDER — METFORMIN HCL 500 MG PO TABS: 500.0000 mg | ORAL_TABLET | Freq: Two times a day (BID) | ORAL | 1 refills | Status: DC

## 2020-02-04 MED ORDER — METFORMIN HCL 500 MG PO TABS: ORAL_TABLET | ORAL | 1 refills | Status: DC

## 2020-02-04 NOTE — Telephone Encounter (Signed)
Please inform patient the following information: Liver, kidney and thyroid function are normal Blood cell counts and electrolytes are normal Diabetes /A1c is higher at 7.2.  Increase Metformin to 2 tabs in the morning and 1 tab in the afternoon.  I did call in a new prescription for him today to reflect those changes. Cholesterol panel looks great and is at goal. Folate, vitamin D and B12 are all normal levels Prostate cancer screening/PSA is normal. Urine microalbumin is normal.  Follow-up in 5.5 months.   RB: Please call the mail-in pharmacy and make sure they discontinue the prior prescription of Metformin and use the prescription of 1000 mg in the morning and 500 mg in the afternoon.  Thanks

## 2020-02-04 NOTE — Progress Notes (Signed)
This visit occurred during the SARS-CoV-2 public health emergency.  Safety protocols were in place, including screening questions prior to the visit, additional usage of staff PPE, and extensive cleaning of exam room while observing appropriate contact time as indicated for disinfecting solutions.    Patient ID: Bruce Kelly, male  DOB: 1960-10-27, 59 y.o.   MRN: [id number] Patient Care Team    Relationship Specialty Notifications Start End  [name], DO PCP - General Family Medicine  11/21/16   [name], MD PCP - Electrophysiology Cardiology Admissions 12/24/17   [name]  11/22/16   [facility] Staff  Family Medicine  09/26/17   [name], MD Consulting Physician Cardiology  12/06/17     Chief Complaint  Patient presents with  . Annual Exam    Fasting. Colonoscopy 2011. Eye Ex[facility]- [facility]- [facility]    Subjective:  Bruce Kelly is a 59 y.o. male present for CPE. All past medical history, surgical history, allergies, family history, immunizations, medications and social history were updated in the electronic medical record today. All recent labs, ED visits and hospitalizations within the last year were reviewed. History of atrialfibrillation s/p ablation/hyperlipidemia: Patient reports he still doing well since his ablation.Marland Kitchen  He is not no longer in need of amiodarone.  He does report compliance with Crestor. Lipids: 12/05/2017 total cholesterol 173, HDL 43, LDL 103, triglycerides 135  Osteopenia/vitamin D deficiency:   DEXA   2020 with osteopenia.    GERD:  Stable on Prilosec 20 mg daily.  Rheumatoid arthritis: He reports overall his rheumatoid arthritis is stable.  He does have some foot discomfort. Established with rheumatology.  Currently prescribed Simponi, methotrexate, folate and Mobic by rheumatology.  Diabetes:Patient reports  compliance with meds. Diagnosed with diabetes May 2018,prediabetic  for some time. He has attended the nutrition classes. Patient denies dizziness, hyperglycemic or hypoglycemic events. Patient denies numbness, tingling in the extremities or nonhealing wounds of feet.  PNA series:Prevnar 1302/21/2019,Pneumovax completed 08/13/2018 Flu shot: Flu shot (recommneded yearly) Urine microalbumin: Completed 08/29/2017-collected today Foot exam:Completed 01/20/2019 Eye exam: UTD 07/17/2018, [facility]  Health maintenance:  Colonoscopy: last screen 2011, recommend follow up 10 years. >  GI referral placed today. Immunizations:  tdap UTD 11/2017, influenza encouraged yearly,PNA series completed 2020,  Shingrix series completed, COVID (pfizer) completed Infectious disease screening: HIV and Hep C completed PSA:  Lab Results  Component Value Date   PSA 0.45 12/05/2017   PSA 0.600 01/25/2016  , pt was counseled on prostate cancer screenings.  DEXA: 01/2019- 5 yr follow up (rheum) Assistive device: none Oxygen [id number] Patient has a Dental home. Hospitalizations/ED visits: reviewed  Depression screen [facility] 2/9 02/04/2020 12/05/2017 04/15/2017 12/20/2016 11/21/2016  Decreased Interest 0 0 0 0 0  Down, Depressed, Hopeless 0 0 0 0 0  PHQ - 2 Score 0 0 0 0 0   No flowsheet data found.       Fall Risk  12/05/2017 12/05/2017 12/20/2016 11/21/2016  Falls in the past year? No No No No      Immunization History  Administered Date(s) Administered  . Influenza,inj,Quad PF,6+ Mos 04/15/2017  . Influenza-Unspecified 07/28/2015, 03/19/2018  . PFIZER SARS-COV-2 Vaccination 10/02/2019, 10/30/2019  . Pneumococcal Conjugate-13 08/29/2017  . Pneumococcal Polysaccharide-23 08/13/2018  . Tdap 12/05/2017  . Zoster Recombinat (Shingrix) 08/13/2018, 01/20/2019     Past Medical History:  Diagnosis Date  . Closed right ankle fracture   . Colon  polyp 2011   x3 polyps, pts reports 10 year follow up  . 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 ED (erectile dysfunction) 09/23/2017  . Elevated ferritin 2013   479 -->  return to normal  . Encounter for long-term (current) use of other medications 09/23/2017  . Erectile dysfunction 2013   Had been on Cialis  . Generalized osteoarthritis   . GERD (gastroesophageal reflux disease) 2013  . Hyperlipidemia LDL goal <130   . Irregular heart rate 09/23/2017  . Neuropathy 09/23/2017  . On statin therapy 04/15/2017  . Osteopenia 2013   prior h/o of Chronic steroids for 10 years; was on Boniva for 6 months. Now takes calcium/vitamin D daily.  . Osteopenia of multiple sites   . Other and unspecified hyperlipidemia 09/23/2017  . Overweight (BMI 25.0-29.9) 11/22/2016  . Peripheral neuropathy 2009   Did not respond to gabapentin by prior records  . Prediabetes   . RA (rheumatoid arthritis) ([place]) 09/23/2017  . Rheumatoid arthritis ([place]) 1998   had been on chronic steroids for about 10 years  . Rheumatoid lung ([place])    pt reports having thoracentesis for rheumatoid lung many years ago.   . 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 Thrombocytopenia ([place]) 2013   Mildly low at 130  . Type 2 diabetes mellitus without complication, without long-term current use of insulin ([place]) 11/22/2016  . Vitamin D deficiency 11/22/2016  . Wears glasses    [facility]   Allergies  Allergen Reactions  . Penicillins Other (See Comments)    Doesn't know  . Sulfa Antibiotics Other (See Comments)    Doesn't know   Past Surgical History:  Procedure Laterality Date  . ABLATION OF DYSRHYTHMIC FOCUS  02/07/2018  . ATRIAL FIBRILLATION ABLATION N/A 02/07/2018   Procedure: ATRIAL FIBRILLATION ABLATION;  Surgeon: [name], MD;  Location: [place] [facility];  Service: Cardiovascular;  Laterality: N/A;  . COLONOSCOPY    . NO PAST SURGERIES     Family History  Problem Relation Age of Onset  . Arthritis Mother   . Diabetes Father   . Heart disease Father   . Arthritis Sister   . Diabetes Sister   . Alcohol  abuse Brother   . Drug abuse Brother   . Breast cancer Paternal Aunt   . Early death Maternal Grandfather    Social History   Social History Narrative   Drinks caffeine, takes a daily vitamin.   Masters degree, Materials engineer.   Exercises routinely.   Wears his seatbelt, firearms locked in the home.   Feels  safe in his relationships.    Allergies as of 02/04/2020      Reactions   Penicillins Other (See Comments)   Doesn't know   Sulfa Antibiotics Other (See Comments)   Doesn't know      Medication List       Accurate as of February 04, 2020  9:31 AM. If you have any questions, ask your nurse or doctor.        b complex vitamins capsule Take 1 capsule by mouth daily.   calcium-vitamin D 500-200 MG-UNIT tablet Commonly known as: OSCAL WITH D Take 1 tablet by mouth daily with breakfast.   folic acid 1 MG tablet Commonly known as: FOLVITE Take 1 mg by mouth daily.   meloxicam 7.5 MG tablet Commonly known as: MOBIC Take 2 tablets by mouth daily.   metFORMIN 500 MG tablet  Commonly known as: GLUCOPHAGE Take 1 tablet (500 mg total) by mouth 2 (two) times daily with a meal.   methotrexate 2.5 MG tablet Commonly known as: RHEUMATREX Take 4 tablets by mouth once a week.   multivitamin capsule Take 1 capsule by mouth daily. Takes womens for extra calcium   omeprazole 20 MG capsule Commonly known as: PRILOSEC Take 1 capsule (20 mg total) by mouth daily.   rosuvastatin 5 MG tablet Commonly known as: CRESTOR TAKE 1 TABLET THREE TIMES  WEEKLY   Simponi 50 MG/0.5ML Sosy Generic drug: Golimumab Inject 50 Units into the skin every 30 (thirty) days.      All past medical history, surgical history, allergies, family history, immunizations andmedications were updated in the EMR today and reviewed under the history and medication portions of their EMR.     No results found for this or any previous visit (from the past 2160 hour(s)).  ROS: 14 pt review of systems performed  and negative (unless mentioned in an HPI)  Objective: BP (!) 129/82 (BP Location: Left Arm, Patient Position: Sitting, Cuff Size: Normal)   Pulse 52   Temp 98 F (36.7 C) (Temporal)   Resp 16   Ht 6' 0.5" (1.842 m)   Wt (!) 206 lb 4 oz (93.6 kg)   SpO2 99%   BMI 27.59 kg/m  Gen: Afebrile. No acute distress. Nontoxic in appearance, well-developed, well-nourished, pleasant male HENT: AT. [place]. Bilateral TM visualized and normal in appearance, normal external auditory canal. MMM, no oral lesions, adequate dentition. Bilateral nares within normal limits. Throat without erythema, ulcerations or exudates.  No cough on exam, no hoarseness on exam. Eyes:Pupils Equal Round Reactive to light, Extraocular movements intact,  Conjunctiva without redness, discharge or icterus. Neck/lymp/endocrine: Supple, no lymphadenopathy, no thyromegaly CV: RRR no murmur, no edema, +2/4 P posterior tibialis pulses.  Chest: CTAB, no wheeze, rhonchi or crackles.  Normal respiratory effort.  Good air movement. Abd: Soft.  Flat. NTND. BS present.  No masses palpated. No hepatosplenomegaly. No rebound tenderness or guarding. Skin: No rashes, purpura or petechiae. Warm and well-perfused. Skin intact. Neuro/Msk:  Normal gait. PERLA. EOMi. Alert. Oriented x3.  Cranial nerves II through XII intact. Muscle strength 5/5 upper/lower extremity. DTRs equal bilaterally. Psych: Normal affect, dress and demeanor. Normal speech. Normal thought content and judgment.  No exam data present  Assessment/plan: Bruce Kelly is a 59 y.o. male present for CPE Type 2 diabetes mellitus without complication, without long-term current use of insulin ([place]) -Stable.A1c 5.8-->6.4--> 6.1--> 6.1>>6.2> collected  today.  - Continue withmetformin500 mg twice a day. PNA series:Prevnar 1302/21/2019,Pneumovax completed 08/13/2018 Flu shot: Flu shot (recommneded yearly) Urine microalbumin: collected today Foot exam:Completed 01/20/2019 Eye exam:  UTD 07/17/2018, [facility] - f/u 6 mos Hyperlipidemia LDL goal <130/On statin therapy/Overweight (BMI 25.0-29.9)/Thrombocytopenia (HCC)/Status post ablation of atrial fibrillation - stable.  Resolution of atrial fibrillation after ablation. - CBC - Comprehensive metabolic panel - TSH - lipids- fasting - continue crestor.  Gastroesophageal reflux disease, esophagitis presence not specified -Stable Continue omeprazole 20 mg   Rheumatoid arthritis with positive rheumatoid factor, involving unspecified site (HCC)/ High risk medication use -Rheumatoid arthritis is stable. Marland Kitchen   He is on a regimen of methotrexate, Simponi, folate and Mobic through his rheumatologist. B12, folate> neuropathy Vitamin D deficiency/osteopenia Supplementing dexa due 2025 (rheum) - Vitamin D (25 hydroxy) Prostate cancer screening - PSA Neuropathy - B12 and Folate Panel High risk medication use - B12 and Folate Panel Colon cancer  screening/Polyp of colon, unspecified part of colon, unspecified type - Ambulatory referral to Gastroenterology Encounter for Preventive health exam: Patient was encouraged to exercise greater than 150 minutes a week. Patient was encouraged to choose a diet filled with fresh fruits and vegetables, and lean meats. AVS provided to patient today for education/recommendation on gender specific health and safety maintenance. Colonoscopy: last screen 2011, recommend follow up 10 years.  Immunizations:  tdap UTD 11/2017, influenza encouraged yearly,PNA series completed 2020,  Shingrix series completed, COVID (pfizer) completed Infectious disease screening: HIV and Hep C completed  Return in about 6 months (around 07/22/2020).  Orders Placed This Encounter  Procedures  . CBC  . Comprehensive metabolic panel  . Hemoglobin A1c  . Lipid panel  . TSH  . PSA  . Urine Microalbumin w/creat. ratio  . Vitamin D (25 hydroxy)  . B12 and Folate Panel  . Ambulatory referral to  Gastroenterology   Meds ordered this encounter  Medications  . rosuvastatin (CRESTOR) 5 MG tablet    Sig: TAKE 1 TABLET THREE TIMES  WEEKLY    Dispense:  45 tablet    Refill:  3  . omeprazole (PRILOSEC) 20 MG capsule    Sig: Take 1 capsule (20 mg total) by mouth daily.    Dispense:  90 capsule    Refill:  1    Needs ov  . metFORMIN (GLUCOPHAGE) 500 MG tablet    Sig: Take 1 tablet (500 mg total) by mouth 2 (two) times daily with a meal.    Dispense:  180 tablet    Refill:  1    Referral Orders     Ambulatory referral to Gastroenterology  Preventive exam completed today with and additional > 20 Minutes was dedicated to this patient's encounter to include pre-visit review of chart, face-to-face time with patient and post-visit work- which include documentation and prescribing medications and/or ordering test when necessary for more than one chronic medical condition.    Note is dictated utilizing voice recognition software. Although note has been proof read prior to signing, occasional typographical errors still can be missed. If any questions arise, please do not hesitate to call for verification.  Electronically signed by: Howard Pouch, DO [place]

## 2020-02-04 NOTE — Telephone Encounter (Signed)
Patient was advised of results. Tried [facility] to d/c prior Rx of Metformin but unable to reach anyone currently.

## 2020-02-04 NOTE — Patient Instructions (Signed)

## 2020-02-05 NOTE — Telephone Encounter (Signed)
All prior RX's were called and cancelled at mail order pharmacy

## 2020-02-16 ENCOUNTER — Other Ambulatory Visit: Payer: Self-pay | Admitting: Family Medicine

## 2020-02-16 DIAGNOSIS — E785 Hyperlipidemia, unspecified: Secondary | ICD-10-CM

## 2020-02-25 DIAGNOSIS — M150 Primary generalized (osteo)arthritis: Secondary | ICD-10-CM | POA: Diagnosis not present

## 2020-02-25 DIAGNOSIS — Z79899 Other long term (current) drug therapy: Secondary | ICD-10-CM | POA: Diagnosis not present

## 2020-02-25 DIAGNOSIS — I499 Cardiac arrhythmia, unspecified: Secondary | ICD-10-CM | POA: Diagnosis not present

## 2020-02-25 DIAGNOSIS — M0579 Rheumatoid arthritis with rheumatoid factor of multiple sites without organ or systems involvement: Secondary | ICD-10-CM | POA: Diagnosis not present

## 2020-03-15 DIAGNOSIS — M5136 Other intervertebral disc degeneration, lumbar region: Secondary | ICD-10-CM | POA: Diagnosis not present

## 2020-03-15 DIAGNOSIS — M9904 Segmental and somatic dysfunction of sacral region: Secondary | ICD-10-CM | POA: Diagnosis not present

## 2020-03-15 DIAGNOSIS — M9903 Segmental and somatic dysfunction of lumbar region: Secondary | ICD-10-CM | POA: Diagnosis not present

## 2020-03-15 DIAGNOSIS — M9902 Segmental and somatic dysfunction of thoracic region: Secondary | ICD-10-CM | POA: Diagnosis not present

## 2020-03-16 DIAGNOSIS — M9902 Segmental and somatic dysfunction of thoracic region: Secondary | ICD-10-CM | POA: Diagnosis not present

## 2020-03-16 DIAGNOSIS — M9904 Segmental and somatic dysfunction of sacral region: Secondary | ICD-10-CM | POA: Diagnosis not present

## 2020-03-16 DIAGNOSIS — M9903 Segmental and somatic dysfunction of lumbar region: Secondary | ICD-10-CM | POA: Diagnosis not present

## 2020-03-16 DIAGNOSIS — M5136 Other intervertebral disc degeneration, lumbar region: Secondary | ICD-10-CM | POA: Diagnosis not present

## 2020-03-17 DIAGNOSIS — M9904 Segmental and somatic dysfunction of sacral region: Secondary | ICD-10-CM | POA: Diagnosis not present

## 2020-03-17 DIAGNOSIS — M9903 Segmental and somatic dysfunction of lumbar region: Secondary | ICD-10-CM | POA: Diagnosis not present

## 2020-03-17 DIAGNOSIS — M5136 Other intervertebral disc degeneration, lumbar region: Secondary | ICD-10-CM | POA: Diagnosis not present

## 2020-03-17 DIAGNOSIS — M9902 Segmental and somatic dysfunction of thoracic region: Secondary | ICD-10-CM | POA: Diagnosis not present

## 2020-03-21 ENCOUNTER — Telehealth: Payer: Self-pay

## 2020-03-21 DIAGNOSIS — M9904 Segmental and somatic dysfunction of sacral region: Secondary | ICD-10-CM | POA: Diagnosis not present

## 2020-03-21 DIAGNOSIS — M5136 Other intervertebral disc degeneration, lumbar region: Secondary | ICD-10-CM | POA: Diagnosis not present

## 2020-03-21 DIAGNOSIS — M9903 Segmental and somatic dysfunction of lumbar region: Secondary | ICD-10-CM | POA: Diagnosis not present

## 2020-03-21 DIAGNOSIS — M9902 Segmental and somatic dysfunction of thoracic region: Secondary | ICD-10-CM | POA: Diagnosis not present

## 2020-03-21 NOTE — Telephone Encounter (Signed)
Created in error

## 2020-03-22 ENCOUNTER — Ambulatory Visit (INDEPENDENT_AMBULATORY_CARE_PROVIDER_SITE_OTHER): Payer: BC Managed Care – PPO | Admitting: Family Medicine

## 2020-03-22 ENCOUNTER — Encounter: Payer: Self-pay | Admitting: Family Medicine

## 2020-03-22 DIAGNOSIS — R21 Rash and other nonspecific skin eruption: Secondary | ICD-10-CM

## 2020-03-22 MED ORDER — CICLOPIROX OLAMINE 0.77 % EX CREA: TOPICAL_CREAM | Freq: Two times a day (BID) | CUTANEOUS | 1 refills | Status: DC

## 2020-03-22 NOTE — Progress Notes (Signed)
Patient ID: Bruce Kelly, male   DOB: [birth year], 59 y.o.   MRN: [id number]   This visit type was conducted due to national recommendations for restrictions regarding the COVID-19 pandemic in an effort to limit this patient's exposure and mitigate transmission in our community.   Virtual Visit via Video Note  I connected with Bruce Kelly on 03/22/20 at 11:30 AM EDT by a video enabled telemedicine application and verified that I am speaking with the correct person using two identifiers.  Location patient: home Location provider:work or home office Persons participating in the virtual visit: patient, provider  I discussed the limitations of evaluation and management by telemedicine and the availability of in person appointments. The patient expressed understanding and agreed to proceed.   HPI: Bruce Kelly called with 2 to 80-month history of intermittent rash in his groin region bilaterally.  He tried some cortisone cream intermittently without much relief.  He does have some pruritus.  He has type 2 diabetes which has been fairly well controlled.  No recent antibiotics.  Did not try any antifungal creams.  No recent prednisone use.   ROS: See pertinent positives and negatives per HPI.  Past Medical History:  Diagnosis Date  . Closed right ankle fracture   . Colon polyp 2011   x3 polyps, pts reports 10 year follow up  . 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 ED (erectile dysfunction) 09/23/2017  . Elevated ferritin 2013   479 -->  return to normal  . Encounter for long-term (current) use of other medications 09/23/2017  . Erectile dysfunction 2013   Had been on Cialis  . Generalized osteoarthritis   . GERD (gastroesophageal reflux disease) 2013  . Hyperlipidemia LDL goal <130   . Irregular heart rate 09/23/2017  . Neuropathy 09/23/2017  . On statin therapy 04/15/2017  . Osteopenia 2013   prior h/o of Chronic steroids for 10 years; was on Boniva for  6 months. Now takes calcium/vitamin D daily.  . Osteopenia of multiple sites   . Other and unspecified hyperlipidemia 09/23/2017  . Overweight (BMI 25.0-29.9) 11/22/2016  . Peripheral neuropathy 2009   Did not respond to gabapentin by prior records  . Prediabetes   . RA (rheumatoid arthritis) ([place]) 09/23/2017  . Rheumatoid arthritis ([place]) 1998   had been on chronic steroids for about 10 years  . Rheumatoid lung ([place])    pt reports having thoracentesis for rheumatoid lung many years ago.   . 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 Thrombocytopenia ([place]) 2013   Mildly low at 130  . Type 2 diabetes mellitus without complication, without long-term current use of insulin ([place]) 11/22/2016  . Vitamin D deficiency 11/22/2016  . Wears glasses    [facility]    Past Surgical History:  Procedure Laterality Date  . ABLATION OF DYSRHYTHMIC FOCUS  02/07/2018  . ATRIAL FIBRILLATION ABLATION N/A 02/07/2018   Procedure: ATRIAL FIBRILLATION ABLATION;  Surgeon: [name], MD;  Location: [facility];  Service: Cardiovascular;  Laterality: N/A;  . COLONOSCOPY    . NO PAST SURGERIES      Family History  Problem Relation Age of Onset  . Arthritis Mother   . Diabetes Father   . Heart disease Father   . Arthritis Sister   . Diabetes Sister   . Alcohol abuse Brother   . Drug abuse Brother   . Breast cancer Paternal Aunt   . Early death Maternal Grandfather  SOCIAL HX: Non-smoker   Current Outpatient Medications:  .  b complex vitamins capsule, Take 1 capsule by mouth daily., Disp: , Rfl:  .  calcium-vitamin D (OSCAL WITH D) 500-200 MG-UNIT tablet, Take 1 tablet by mouth daily with breakfast. , Disp: , Rfl:  .  folic acid (FOLVITE) 1 MG tablet, Take 1 mg by mouth daily. , Disp: , Rfl:  .  Golimumab (SIMPONI) 50 MG/0.5ML SOSY, Inject 50 Units into the skin every 30 (thirty) days. , Disp: , Rfl:  .  meloxicam (MOBIC) 7.5 MG tablet, Take 2 tablets by mouth daily., Disp: , Rfl:  .   metFORMIN (GLUCOPHAGE) 500 MG tablet, 2 tabs (1000 mg) in the morning and 1 tab (500 mg) in the afternoon., Disp: 270 tablet, Rfl: 1 .  methotrexate (RHEUMATREX) 2.5 MG tablet, Take 4 tablets by mouth once a week., Disp: , Rfl:  .  Multiple Vitamin (MULTIVITAMIN) capsule, Take 1 capsule by mouth daily. Takes womens for extra calcium, Disp: , Rfl:  .  omeprazole (PRILOSEC) 20 MG capsule, Take 1 capsule (20 mg total) by mouth daily., Disp: 90 capsule, Rfl: 1 .  rosuvastatin (CRESTOR) 5 MG tablet, TAKE 1 TABLET THREE TIMES  WEEKLY, Disp: 45 tablet, Rfl: 3 .  ciclopirox (LOPROX) 0.77 % cream, Apply topically 2 (two) times daily., Disp: 30 g, Rfl: 1  EXAM:  VITALS per patient if applicable:  GENERAL: alert, oriented, appears well and in no acute distress  HEENT: atraumatic, conjunttiva clear, no obvious abnormalities on inspection of external nose and ears  NECK: normal movements of the head and neck  LUNGS: on inspection no signs of respiratory distress, breathing rate appears normal, no obvious gross SOB, gasping or wheezing  CV: no obvious cyanosis  MS: moves all visible extremities without noticeable abnormality  PSYCH/NEURO: pleasant and cooperative, no obvious depression or anxiety, speech and thought processing grossly intact  ASSESSMENT AND PLAN:  Discussed the following assessment and plan:  Patient reports 2 to 60-month history of groin rash with fairly well demarcated border.  Question tinea cruris.  -Recommend trial of Loprox cream twice daily and keep area dry as possible -If not clearing in the next couple weeks need follow-up with primary to rule out other possibilities such as corynebacterium, etc.     I discussed the assessment and treatment plan with the patient. The patient was provided an opportunity to ask questions and all were answered. The patient agreed with the plan and demonstrated an understanding of the instructions.   The patient was advised to call back  or seek an in-person evaluation if the symptoms worsen or if the condition fails to improve as anticipated.     Carolann Littler, MD

## 2020-03-23 DIAGNOSIS — M5136 Other intervertebral disc degeneration, lumbar region: Secondary | ICD-10-CM | POA: Diagnosis not present

## 2020-03-23 DIAGNOSIS — M9903 Segmental and somatic dysfunction of lumbar region: Secondary | ICD-10-CM | POA: Diagnosis not present

## 2020-03-23 DIAGNOSIS — M9904 Segmental and somatic dysfunction of sacral region: Secondary | ICD-10-CM | POA: Diagnosis not present

## 2020-03-23 DIAGNOSIS — M9902 Segmental and somatic dysfunction of thoracic region: Secondary | ICD-10-CM | POA: Diagnosis not present

## 2020-03-24 DIAGNOSIS — M9904 Segmental and somatic dysfunction of sacral region: Secondary | ICD-10-CM | POA: Diagnosis not present

## 2020-03-24 DIAGNOSIS — M9902 Segmental and somatic dysfunction of thoracic region: Secondary | ICD-10-CM | POA: Diagnosis not present

## 2020-03-24 DIAGNOSIS — M9903 Segmental and somatic dysfunction of lumbar region: Secondary | ICD-10-CM | POA: Diagnosis not present

## 2020-03-24 DIAGNOSIS — M5136 Other intervertebral disc degeneration, lumbar region: Secondary | ICD-10-CM | POA: Diagnosis not present

## 2020-03-28 DIAGNOSIS — M9903 Segmental and somatic dysfunction of lumbar region: Secondary | ICD-10-CM | POA: Diagnosis not present

## 2020-03-28 DIAGNOSIS — M9902 Segmental and somatic dysfunction of thoracic region: Secondary | ICD-10-CM | POA: Diagnosis not present

## 2020-03-28 DIAGNOSIS — M9904 Segmental and somatic dysfunction of sacral region: Secondary | ICD-10-CM | POA: Diagnosis not present

## 2020-03-28 DIAGNOSIS — M5136 Other intervertebral disc degeneration, lumbar region: Secondary | ICD-10-CM | POA: Diagnosis not present

## 2020-03-30 DIAGNOSIS — M9904 Segmental and somatic dysfunction of sacral region: Secondary | ICD-10-CM | POA: Diagnosis not present

## 2020-03-30 DIAGNOSIS — M5136 Other intervertebral disc degeneration, lumbar region: Secondary | ICD-10-CM | POA: Diagnosis not present

## 2020-03-30 DIAGNOSIS — M9903 Segmental and somatic dysfunction of lumbar region: Secondary | ICD-10-CM | POA: Diagnosis not present

## 2020-03-30 DIAGNOSIS — M9902 Segmental and somatic dysfunction of thoracic region: Secondary | ICD-10-CM | POA: Diagnosis not present

## 2020-04-05 ENCOUNTER — Ambulatory Visit (INDEPENDENT_AMBULATORY_CARE_PROVIDER_SITE_OTHER): Payer: BC Managed Care – PPO | Admitting: Family Medicine

## 2020-04-05 ENCOUNTER — Other Ambulatory Visit: Payer: Self-pay

## 2020-04-05 ENCOUNTER — Encounter: Payer: Self-pay | Admitting: Family Medicine

## 2020-04-05 VITALS — BP 118/77 | HR 67 | Temp 98.2°F | Ht 72.0 in | Wt 205.0 lb

## 2020-04-05 DIAGNOSIS — Z23 Encounter for immunization: Secondary | ICD-10-CM | POA: Diagnosis not present

## 2020-04-05 DIAGNOSIS — E119 Type 2 diabetes mellitus without complications: Secondary | ICD-10-CM

## 2020-04-05 DIAGNOSIS — B353 Tinea pedis: Secondary | ICD-10-CM | POA: Diagnosis not present

## 2020-04-05 DIAGNOSIS — L84 Corns and callosities: Secondary | ICD-10-CM

## 2020-04-05 DIAGNOSIS — S90851A Superficial foreign body, right foot, initial encounter: Secondary | ICD-10-CM

## 2020-04-05 DIAGNOSIS — B356 Tinea cruris: Secondary | ICD-10-CM

## 2020-04-05 MED ORDER — CICLOPIROX OLAMINE 0.77 % EX CREA: TOPICAL_CREAM | Freq: Two times a day (BID) | CUTANEOUS | 1 refills | Status: DC

## 2020-04-05 NOTE — Progress Notes (Signed)
Rash    This visit occurred during the SARS-CoV-2 public health emergency.  Safety protocols were in place, including screening questions prior to the visit, additional usage of staff PPE, and extensive cleaning of exam room while observing appropriate contact time as indicated for disinfecting solutions.    Bruce Kelly , 1960/09/05, 59 y.o., male MRN: [id number] Patient Care Team    Relationship Specialty Notifications Start End  [name], DO PCP - General Family Medicine  11/21/16   [name], MD PCP - Electrophysiology Cardiology Admissions 12/24/17   [name]  11/22/16   Mar[place]e Staff  Family Medicine  09/26/17   [name], MD Consulting Physician Cardiology  12/06/17     Chief Complaint  Patient presents with  . Rash    pt c/o rash in groin area x 3 mos; gradually improving; was seen 2 weeks ago with Dr. Elease Hashimoto and has improved with medication  . Mass    pt c/o painful lump on bottom right foot with some discoloration x 4 weeks;      Subjective: Pt presents for an OV to discuss of his groin and painful area located on his foot.  Rash: Patient was seen 2 weeks ago by another provider concerning a rash in his groin area that have been present for about 3 months.  He had been prescribed ciclopirox cream and the rash has been improving.  He would like more guidance on if he should continue the cream and if so he would need refills.  Foot pain: Patient reports around the end of July he stepped on something and cut his foot.  He did not pay much attention to it at the time but he has noticed it has continued to bother him and cause discomfort.  He denies any redness or swelling.  He denies any fevers.  He states it looks discolored.  The area is located on the bottom of his foot proximal to his toes.  He is a diabetic.  His tetanus is up-to-date.   Depression screen [facility] 2/9 02/04/2020 12/05/2017 04/15/2017 12/20/2016  11/21/2016  Decreased Interest 0 0 0 0 0  Down, Depressed, Hopeless 0 0 0 0 0  PHQ - 2 Score 0 0 0 0 0    Allergies  Allergen Reactions  . Penicillins Other (See Comments)    Doesn't know  . Sulfa Antibiotics Other (See Comments)    Doesn't know   Social History   Social History Narrative   Drinks caffeine, takes a daily vitamin.   Masters degree, Materials engineer.   Exercises routinely.   Wears his seatbelt, firearms locked in the home.   Feels  safe in his relationships.   Past Medical History:  Diagnosis Date  . Closed right ankle fracture   . Colon polyp 2011   x3 polyps, pts reports 10 year follow up  . 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 ED (erectile dysfunction) 09/23/2017  . Elevated ferritin 2013   479 -->  return to normal  . Encounter for long-term (current) use of other medications 09/23/2017  . Erectile dysfunction 2013   Had been on Cialis  . Generalized osteoarthritis   . GERD (gastroesophageal reflux disease) 2013  . Hyperlipidemia LDL goal <130   . Irregular heart rate 09/23/2017  . Neuropathy 09/23/2017  . On statin therapy 04/15/2017  . Osteopenia 2013   prior h/o of  Chronic steroids for 10 years; was on Boniva for 6 months. Now takes calcium/vitamin D daily.  . Osteopenia of multiple sites   . Other and unspecified hyperlipidemia 09/23/2017  . Overweight (BMI 25.0-29.9) 11/22/2016  . Peripheral neuropathy 2009   Did not respond to gabapentin by prior records  . Prediabetes   . RA (rheumatoid arthritis) ([place]) 09/23/2017  . Rheumatoid arthritis ([place]) 1998   had been on chronic steroids for about 10 years  . Rheumatoid lung ([place])    pt reports having thoracentesis for rheumatoid lung many years ago.   . 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 Thrombocytopenia ([place]) 2013   Mildly low at 130  . Type 2 diabetes mellitus without complication, without long-term current use of insulin ([place]) 11/22/2016  . Vitamin D deficiency  11/22/2016  . Wears glasses    [facility]   Past Surgical History:  Procedure Laterality Date  . ABLATION OF DYSRHYTHMIC FOCUS  02/07/2018  . ATRIAL FIBRILLATION ABLATION N/A 02/07/2018   Procedure: ATRIAL FIBRILLATION ABLATION;  Surgeon: [name], MD;  Location: [place] [facility];  Service: Cardiovascular;  Laterality: N/A;  . COLONOSCOPY    . NO PAST SURGERIES     Family History  Problem Relation Age of Onset  . Arthritis Mother   . Diabetes Father   . Heart disease Father   . Arthritis Sister   . Diabetes Sister   . Alcohol abuse Brother   . Drug abuse Brother   . Breast cancer Paternal Aunt   . Early death Maternal Grandfather    Allergies as of 04/05/2020      Reactions   Penicillins Other (See Comments)   Doesn't know   Sulfa Antibiotics Other (See Comments)   Doesn't know      Medication List       Accurate as of April 05, 2020  2:27 PM. If you have any questions, ask your nurse or doctor.        b complex vitamins capsule Take 1 capsule by mouth daily.   calcium-vitamin D 500-200 MG-UNIT tablet Commonly known as: OSCAL WITH D Take 1 tablet by mouth daily with breakfast.   ciclopirox 0.77 % cream Commonly known as: Loprox Apply topically 2 (two) times daily.   folic acid 1 MG tablet Commonly known as: FOLVITE Take 1 mg by mouth daily.   meloxicam 7.5 MG tablet Commonly known as: MOBIC Take 2 tablets by mouth daily.   metFORMIN 500 MG tablet Commonly known as: GLUCOPHAGE 2 tabs (1000 mg) in the morning and 1 tab (500 mg) in the afternoon.   methotrexate 2.5 MG tablet Commonly known as: RHEUMATREX Take 4 tablets by mouth once a week.   multivitamin capsule Take 1 capsule by mouth daily. Takes womens for extra calcium   omeprazole 20 MG capsule Commonly known as: PRILOSEC Take 1 capsule (20 mg total) by mouth daily.   rosuvastatin 5 MG tablet Commonly known as: CRESTOR TAKE 1 TABLET THREE TIMES  WEEKLY   Simponi 50  MG/0.5ML Sosy Generic drug: Golimumab Inject 50 Units into the skin every 30 (thirty) days.       All past medical history, surgical history, allergies, family history, immunizations andmedications were updated in the EMR today and reviewed under the history and medication portions of their EMR.     ROS: Negative, with the exception of above mentioned in HPI   Objective:  BP 118/77   Pulse 67   Temp 98.2 F (36.8  C) (Oral)   Ht 6' (1.829 m)   Wt 205 lb (93 kg)   SpO2 98%   BMI 27.80 kg/m  Body mass index is 27.8 kg/m. Gen: Afebrile. No acute distress. Nontoxic in appearance, well developed, well nourished.  HENT: AT. [place].  Eyes:Pupils Equal Round Reactive to light, Extraocular movements intact,  Conjunctiva without redness, discharge or icterus. MSK (foot): Right plantar aspect of foot with foreign body and callus formation just proximal to second digit plantar aspect.  Tender to palpation over this location.  No erythema, no drainage, no swelling. Skin: Bilateral groin area with hyperpigmented flat rash with some small very faint areas of erythema remaining , no purpura or petechiae.  Neuro: Normal gait. PERLA. EOMi. Alert. Oriented x3 Psych: Normal affect, dress and demeanor. Normal speech. Normal thought content and judgment.  No exam data present No results found. No results found for this or any previous visit (from the past 24 hour(s)).  Assessment/Plan: Bruce Kelly is a 59 y.o. male present for OV for  Need for influenza vaccination - Flu Vaccine QUAD 6+ mos PF IM (Fluarix Quad PF)  Callus/type 2 diabetes mellitus without complication, without long-term current use of insulin ([place]) Reviewed area under magnification there appears to be a retained foreign body, such as a piece of gravel or sand within callus formation.  He is a diabetic which puts him at higher risk for infection.  We discussed options today and currently does not appear infected.  Recommend podiatry  referral for callus/foreign body removal with close monitoring secondary to diabetic condition.  He is agreeable to this today. - Ambulatory referral to Podiatry  Tinea cruris Responding well to antifungal cream. Encouraged him to continue ciclopirox twice daily application.  Ideally would continue medication approximately 1 week after he feels rash has completely resolved.  Did explain to him he will likely have some mild hyperpigmentation that remains in this area.  That is not to be confused with the active fungal infection. Encouraged him to avoid wearing damp swim trunks for long periods of time.  Attempt to use a fresh towel and washcloth after every cleansing, at least until infection is completely resolved.   Reviewed expectations re: course of current medical issues.  Discussed self-management of symptoms.  Outlined signs and symptoms indicating need for more acute intervention.  Patient verbalized understanding and all questions were answered.  Patient received an After-Visit Summary.    Orders Placed This Encounter  Procedures  . Flu Vaccine QUAD 6+ mos PF IM (Fluarix Quad PF)   Meds ordered this encounter  Medications  . ciclopirox (LOPROX) 0.77 % cream    Sig: Apply topically 2 (two) times daily.    Dispense:  90 g    Refill:  1   Referral Orders  No referral(s) requested today     Note is dictated utilizing voice recognition software. Although note has been proof read prior to signing, occasional typographical errors still can be missed. If any questions arise, please do not hesitate to call for verification.   electronically signed by:  Howard Pouch, DO  [place]

## 2020-04-05 NOTE — Patient Instructions (Addendum)
Refilled the cream for fungal infection. Remember new towel/new wash cloth each time.   Referral to podiatry placed for callus. Do not pick at this! Watch for infection.   Jock Itch  Jock itch is an itchy rash in the groin and upper thigh area. It is a skin infection that is caused by a type of germ that lives in dark, damp places (fungus). The rash usually goes away in 2-3 weeks with treatment. Follow these instructions at home: Skin care  Use skin creams, ointments, or powders exactly as told by your doctor.  Wear loose-fitting clothes. Clothes should not rub against your groin area. Men should wear boxer shorts or loose-fitting underwear.  Keep your groin area clean and dry. ? Change your underwear every day. ? Change out of wet bathing suits as soon as you can. ? After bathing, use a separate towel to dry your groin area. Dry the area gently and completely.  Avoid hot baths and showers. Hot water can make itching worse.  Do not scratch the area. General instructions  Take and apply over-the-counter and prescription medicines only as told by your doctor.  Do not share towels or clothing with other people.  Wash your hands often with soap and water, especially after touching your groin area. If you do not have soap and water, use alcohol-based hand sanitizer. Contact a doctor if:  Your rash: ? Gets worse. ? Does not get better after 2 weeks of treatment. ? Spreads. ? Comes back after treatment is done.  You have any of the following: ? A fever. ? New or worsening redness, swelling, or pain around your rash. ? Fluid, blood, or pus coming from your rash. Summary  Jock itch is an itchy rash. It affects the groin and upper thigh area.  Jock itch usually goes away in 2-3 weeks with treatment.  Keep your groin area clean and dry. This information is not intended to replace advice given to you by your health care provider. Make sure you discuss any questions you have with  your health care provider. Document Revised: 06/07/2017 Document Reviewed: 06/05/2017 Elsevier Patient Education  2020 [employer].

## 2020-04-06 ENCOUNTER — Encounter: Payer: Self-pay | Admitting: Gastroenterology

## 2020-04-06 ENCOUNTER — Other Ambulatory Visit: Payer: Self-pay | Admitting: Family Medicine

## 2020-04-06 DIAGNOSIS — M5136 Other intervertebral disc degeneration, lumbar region: Secondary | ICD-10-CM | POA: Diagnosis not present

## 2020-04-06 DIAGNOSIS — M9902 Segmental and somatic dysfunction of thoracic region: Secondary | ICD-10-CM | POA: Diagnosis not present

## 2020-04-06 DIAGNOSIS — M9903 Segmental and somatic dysfunction of lumbar region: Secondary | ICD-10-CM | POA: Diagnosis not present

## 2020-04-06 DIAGNOSIS — M9904 Segmental and somatic dysfunction of sacral region: Secondary | ICD-10-CM | POA: Diagnosis not present

## 2020-04-06 DIAGNOSIS — E785 Hyperlipidemia, unspecified: Secondary | ICD-10-CM

## 2020-04-06 NOTE — Telephone Encounter (Signed)
Pt called states [facility] said too soon to refill. [facility] new script with one refill. Pt will contact pharmacy.

## 2020-04-06 NOTE — Telephone Encounter (Signed)
Agree with plan 

## 2020-04-07 ENCOUNTER — Encounter: Payer: Self-pay | Admitting: Family Medicine

## 2020-04-18 ENCOUNTER — Ambulatory Visit (AMBULATORY_SURGERY_CENTER): Payer: Self-pay | Admitting: *Deleted

## 2020-04-18 ENCOUNTER — Other Ambulatory Visit: Payer: Self-pay

## 2020-04-18 VITALS — Ht 72.0 in | Wt 200.0 lb

## 2020-04-18 DIAGNOSIS — Z1211 Encounter for screening for malignant neoplasm of colon: Secondary | ICD-10-CM

## 2020-04-18 MED ORDER — SUTAB 1479-225-188 MG PO TABS: 1.0000 | ORAL_TABLET | Freq: Once | ORAL | 0 refills | Status: AC

## 2020-04-18 NOTE — Progress Notes (Signed)

## 2020-04-19 DIAGNOSIS — M9902 Segmental and somatic dysfunction of thoracic region: Secondary | ICD-10-CM | POA: Diagnosis not present

## 2020-04-19 DIAGNOSIS — M9903 Segmental and somatic dysfunction of lumbar region: Secondary | ICD-10-CM | POA: Diagnosis not present

## 2020-04-19 DIAGNOSIS — M5136 Other intervertebral disc degeneration, lumbar region: Secondary | ICD-10-CM | POA: Diagnosis not present

## 2020-04-19 DIAGNOSIS — M9904 Segmental and somatic dysfunction of sacral region: Secondary | ICD-10-CM | POA: Diagnosis not present

## 2020-04-21 ENCOUNTER — Ambulatory Visit: Payer: Self-pay | Admitting: Podiatry

## 2020-04-26 ENCOUNTER — Encounter: Payer: Self-pay | Admitting: Gastroenterology

## 2020-04-27 DIAGNOSIS — M5136 Other intervertebral disc degeneration, lumbar region: Secondary | ICD-10-CM | POA: Diagnosis not present

## 2020-04-27 DIAGNOSIS — M9902 Segmental and somatic dysfunction of thoracic region: Secondary | ICD-10-CM | POA: Diagnosis not present

## 2020-04-27 DIAGNOSIS — M9904 Segmental and somatic dysfunction of sacral region: Secondary | ICD-10-CM | POA: Diagnosis not present

## 2020-04-27 DIAGNOSIS — M9903 Segmental and somatic dysfunction of lumbar region: Secondary | ICD-10-CM | POA: Diagnosis not present

## 2020-05-03 DIAGNOSIS — M9903 Segmental and somatic dysfunction of lumbar region: Secondary | ICD-10-CM | POA: Diagnosis not present

## 2020-05-03 DIAGNOSIS — M5136 Other intervertebral disc degeneration, lumbar region: Secondary | ICD-10-CM | POA: Diagnosis not present

## 2020-05-03 DIAGNOSIS — M9902 Segmental and somatic dysfunction of thoracic region: Secondary | ICD-10-CM | POA: Diagnosis not present

## 2020-05-05 ENCOUNTER — Ambulatory Visit (INDEPENDENT_AMBULATORY_CARE_PROVIDER_SITE_OTHER): Payer: BC Managed Care – PPO

## 2020-05-05 ENCOUNTER — Ambulatory Visit: Payer: Self-pay

## 2020-05-05 ENCOUNTER — Ambulatory Visit (INDEPENDENT_AMBULATORY_CARE_PROVIDER_SITE_OTHER): Payer: BC Managed Care – PPO | Admitting: Podiatry

## 2020-05-05 ENCOUNTER — Encounter: Payer: Self-pay | Admitting: Podiatry

## 2020-05-05 ENCOUNTER — Other Ambulatory Visit: Payer: Self-pay | Admitting: Podiatry

## 2020-05-05 ENCOUNTER — Other Ambulatory Visit: Payer: Self-pay

## 2020-05-05 DIAGNOSIS — S90851A Superficial foreign body, right foot, initial encounter: Secondary | ICD-10-CM

## 2020-05-05 DIAGNOSIS — B353 Tinea pedis: Secondary | ICD-10-CM

## 2020-05-05 DIAGNOSIS — E119 Type 2 diabetes mellitus without complications: Secondary | ICD-10-CM

## 2020-05-05 DIAGNOSIS — L84 Corns and callosities: Secondary | ICD-10-CM

## 2020-05-05 DIAGNOSIS — S90852A Superficial foreign body, left foot, initial encounter: Secondary | ICD-10-CM

## 2020-05-05 MED ORDER — KETOCONAZOLE 2 % EX CREA: 1.0000 "application " | TOPICAL_CREAM | Freq: Every day | CUTANEOUS | 0 refills | Status: DC

## 2020-05-05 NOTE — Patient Instructions (Signed)
Diabetes Mellitus and Foot Care Foot care is an important part of your health, especially when you have diabetes. Diabetes may cause you to have problems because of poor blood flow (circulation) to your feet and legs, which can cause your skin to:  Become thinner and drier.  Break more easily.  Heal more slowly.  Peel and crack. You may also have nerve damage (neuropathy) in your legs and feet, causing decreased feeling in them. This means that you may not notice minor injuries to your feet that could lead to more serious problems. Noticing and addressing any potential problems early is the best way to prevent future foot problems. How to care for your feet Foot hygiene  Wash your feet daily with warm water and mild soap. Do not use hot water. Then, pat your feet and the areas between your toes until they are completely dry. Do not soak your feet as this can dry your skin.  Trim your toenails straight across. Do not dig under them or around the cuticle. File the edges of your nails with an emery board or nail file.  Apply a moisturizing lotion or petroleum jelly to the skin on your feet and to dry, brittle toenails. Use lotion that does not contain alcohol and is unscented. Do not apply lotion between your toes. Shoes and socks  Wear clean socks or stockings every day. Make sure they are not too tight. Do not wear knee-high stockings since they may decrease blood flow to your legs.  Wear shoes that fit properly and have enough cushioning. Always look in your shoes before you put them on to be sure there are no objects inside.  To break in new shoes, wear them for just a few hours a day. This prevents injuries on your feet. Wounds, scrapes, corns, and calluses  Check your feet daily for blisters, cuts, bruises, sores, and redness. If you cannot see the bottom of your feet, use a mirror or ask someone for help.  Do not cut corns or calluses or try to remove them with medicine.  If you  find a minor scrape, cut, or break in the skin on your feet, keep it and the skin around it clean and dry. You may clean these areas with mild soap and water. Do not clean the area with peroxide, alcohol, or iodine.  If you have a wound, scrape, corn, or callus on your foot, look at it several times a day to make sure it is healing and not infected. Check for: ? Redness, swelling, or pain. ? Fluid or blood. ? Warmth. ? Pus or a bad smell. General instructions  Do not cross your legs. This may decrease blood flow to your feet.  Do not use heating pads or hot water bottles on your feet. They may burn your skin. If you have lost feeling in your feet or legs, you may not know this is happening until it is too late.  Protect your feet from hot and cold by wearing shoes, such as at the beach or on hot pavement.  Schedule a complete foot exam at least once a year (annually) or more often if you have foot problems. If you have foot problems, report any cuts, sores, or bruises to your health care provider immediately. Contact a health care provider if:  You have a medical condition that increases your risk of infection and you have any cuts, sores, or bruises on your feet.  You have an injury that is not   healing.  You have redness on your legs or feet.  You feel burning or tingling in your legs or feet.  You have pain or cramps in your legs and feet.  Your legs or feet are numb.  Your feet always feel cold.  You have pain around a toenail. Get help right away if:  You have a wound, scrape, corn, or callus on your foot and: ? You have pain, swelling, or redness that gets worse. ? You have fluid or blood coming from the wound, scrape, corn, or callus. ? Your wound, scrape, corn, or callus feels warm to the touch. ? You have pus or a bad smell coming from the wound, scrape, corn, or callus. ? You have a fever. ? You have a red line going up your leg. Summary  Check your feet every day  for cuts, sores, red spots, swelling, and blisters.  Moisturize feet and legs daily.  Wear shoes that fit properly and have enough cushioning.  If you have foot problems, report any cuts, sores, or bruises to your health care provider immediately.  Schedule a complete foot exam at least once a year (annually) or more often if you have foot problems. This information is not intended to replace advice given to you by your health care provider. Make sure you discuss any questions you have with your health care provider. Document Revised: 03/18/2019 Document Reviewed: 07/27/2016 Elsevier Patient Education  2020 Elsevier Inc.  

## 2020-05-07 NOTE — Progress Notes (Signed)
Subjective:   Patient ID: Bruce Kelly, male   DOB: 59 y.o.   MRN: [id number]   HPI 59 year old male presents the office today for concerns of a callus on the bottom of his right foot which started mid June while he was at the beach.  He states the area.  They prescribed a small again and there is callus.  He states the callus almost gone at this point.  Was not sure if he had stepped on something.  Denies any drainage or pus from swelling.  He is diabetic and last A1c was 7.2.  He states he tries to exercise and eat healthy to help with the diabetes.  Also some secondary concerns of peeling skin present ongoing for years to the bottom of the feet.  No current itching.  He has no other concerns.   Review of Systems  All other systems reviewed and are negative.  Past Medical History:  Diagnosis Date  . Closed right ankle fracture   . Colon polyp 2011   x3 polyps, pts reports 10 year follow up  . 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 ED (erectile dysfunction) 09/23/2017  . Elevated ferritin 2013   479 -->  return to normal  . Encounter for long-term (current) use of other medications 09/23/2017  . Erectile dysfunction 2013   Had been on Cialis  . Generalized osteoarthritis   . GERD (gastroesophageal reflux disease) 2013  . Hyperlipidemia LDL goal <130   . Irregular heart rate 09/23/2017  . Neuropathy 09/23/2017  . On statin therapy 04/15/2017  . Osteopenia 2013   prior h/o of Chronic steroids for 10 years; was on Boniva for 6 months. Now takes calcium/vitamin D daily.  . Osteopenia of multiple sites   . Other and unspecified hyperlipidemia 09/23/2017  . Overweight (BMI 25.0-29.9) 11/22/2016  . Peripheral neuropathy 2009   Did not respond to gabapentin by prior records  . Prediabetes   . RA (rheumatoid arthritis) ([place]) 09/23/2017  . Rheumatoid arthritis ([place]) 1998   had been on chronic steroids for about 10 years  . Rheumatoid lung ([place])    pt  reports having thoracentesis for rheumatoid lung many years ago.   . 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 Thrombocytopenia ([place]) 2013   Mildly low at 130  . Type 2 diabetes mellitus without complication, without long-term current use of insulin ([place]) 11/22/2016  . Vitamin D deficiency 11/22/2016  . Wears glasses    [facility]    Past Surgical History:  Procedure Laterality Date  . ABLATION OF DYSRHYTHMIC FOCUS  02/07/2018  . ATRIAL FIBRILLATION ABLATION N/A 02/07/2018   Procedure: ATRIAL FIBRILLATION ABLATION;  Surgeon: [name], MD;  Location: [place] [facility];  Service: Cardiovascular;  Laterality: N/A;  . COLONOSCOPY    . COLONOSCOPY    . NO PAST SURGERIES       Current Outpatient Medications:  .  aspirin 81 MG chewable tablet, Chew by mouth daily., Disp: , Rfl:  .  b complex vitamins capsule, Take 1 capsule by mouth daily., Disp: , Rfl:  .  calcium-vitamin D (OSCAL WITH D) 500-200 MG-UNIT tablet, Take 1 tablet by mouth daily with breakfast. , Disp: , Rfl:  .  ciclopirox (LOPROX) 0.77 % cream, Apply topically 2 (two) times daily., Disp: 90 g, Rfl: 1 .  folic acid (FOLVITE) 1 MG tablet, Take 1 mg by mouth daily. , Disp: , Rfl:  .  Golimumab (SIMPONI) 50 MG/0.5ML SOSY, Inject 50 Units into the skin every 30 (thirty) days. , Disp: , Rfl:  .  ketoconazole (NIZORAL) 2 % cream, Apply 1 application topically daily., Disp: 60 g, Rfl: 0 .  meloxicam (MOBIC) 15 MG tablet, Take 15 mg by mouth daily., Disp: , Rfl:  .  meloxicam (MOBIC) 7.5 MG tablet, Take 2 tablets by mouth daily., Disp: , Rfl:  .  metFORMIN (GLUCOPHAGE) 500 MG tablet, 2 tabs (1000 mg) in the morning and 1 tab (500 mg) in the afternoon., Disp: 270 tablet, Rfl: 1 .  methotrexate (RHEUMATREX) 2.5 MG tablet, Take 4 tablets by mouth once a week., Disp: , Rfl:  .  Multiple Vitamin (MULTIVITAMIN) capsule, Take 1 capsule by mouth daily. Takes womens for extra calcium, Disp: , Rfl:  .  omeprazole (PRILOSEC) 20 MG capsule,  Take 1 capsule (20 mg total) by mouth daily., Disp: 90 capsule, Rfl: 1 .  rosuvastatin (CRESTOR) 5 MG tablet, TAKE 1 TABLET THREE TIMES  WEEKLY, Disp: 45 tablet, Rfl: 3 .  SUTAB [phone] MG TABS, , Disp: , Rfl:   Allergies  Allergen Reactions  . Penicillins Other (See Comments)    Doesn't know  . Sulfa Antibiotics Other (See Comments)    Doesn't know          Objective:  Physical Exam  General: AAO x3, NAD  Dermatological: Hyperkeratotic tissue right foot submetatarsal 2 area with a hyperkeratotic lesion.  There is no underlying ulceration drainage or signs of infection.  There was some dried blood prior to debridement.  There is no evidence of foreign body or puncture wound.  Hyperkeratotic lesion on the left medial hallux with dried blood and upon debridement there is no underlying ulceration drainage or signs of infection.  Dry skin with slight erythematous base to the plantar foot.  There is no open lesions today.   Vascular: Dorsalis Pedis artery and Posterior Tibial artery pedal pulses are 2/4 bilateral with immedate capillary fill time. There is no pain with calf compression, swelling, warmth, erythema.   Neruologic: Grossly intact via light touch bilateral. Protective threshold with Semmes Wienstein monofilament intact to all pedal sites bilateral.   Musculoskeletal: No areas of pinpoint tenderness.  No edema, erythema otherwise.  Muscular strength 5/5 in all groups tested bilateral.  Gait: Unassisted, Nonantalgic.       Assessment:   59 year old male callus formation, likely tinea pedis    Plan:  -Treatment options discussed including all alternatives, risks, and complications -Etiology of symptoms were discussed -X-rays was obtained reviewed of the right foot skin marker was utilized to identify the area the skin lesion.  There is no underlying foreign body identified.  No evidence of acute fracture or calcifications. -I debrided the calluses x2 without  complications or bleeding.  Recommend moisturizer and offloading daily. -Prescribed ketoconazole  -Discussed daily foot inspection.  Trula Slade DPM

## 2020-05-17 ENCOUNTER — Encounter: Payer: Self-pay | Admitting: Gastroenterology

## 2020-05-17 ENCOUNTER — Other Ambulatory Visit: Payer: Self-pay

## 2020-05-17 ENCOUNTER — Ambulatory Visit (AMBULATORY_SURGERY_CENTER): Payer: BC Managed Care – PPO | Admitting: Gastroenterology

## 2020-05-17 VITALS — BP 109/58 | HR 61 | Temp 97.5°F | Resp 14 | Ht 72.0 in | Wt 200.0 lb

## 2020-05-17 DIAGNOSIS — Z1211 Encounter for screening for malignant neoplasm of colon: Secondary | ICD-10-CM

## 2020-05-17 DIAGNOSIS — K635 Polyp of colon: Secondary | ICD-10-CM | POA: Diagnosis not present

## 2020-05-17 DIAGNOSIS — D120 Benign neoplasm of cecum: Secondary | ICD-10-CM

## 2020-05-17 MED ORDER — SODIUM CHLORIDE 0.9 % IV SOLN: 500.0000 mL | Freq: Once | INTRAVENOUS | Status: DC

## 2020-05-17 NOTE — Patient Instructions (Signed)
Read all of the handouts given to you by your recovery room nurse.  Thank-you for choosing [place] for your healthcare needs today.  YOU HAD AN ENDOSCOPIC PROCEDURE TODAY AT [facility]:   Refer to the procedure report that was given to you for any specific questions about what was found during the examination.  If the procedure report does not answer your questions, please call your gastroenterologist to clarify.  If you requested that your care partner not be given the details of your procedure findings, then the procedure report has been included in a sealed envelope for you to review at your convenience later.  YOU SHOULD EXPECT: Some feelings of bloating in the abdomen. Passage of more gas than usual.  Walking can help get rid of the air that was put into your GI tract during the procedure and reduce the bloating. If you had a lower endoscopy (such as a colonoscopy or flexible sigmoidoscopy) you may notice spotting of blood in your stool or on the toilet paper. If you underwent a bowel prep for your procedure, you may not have a normal bowel movement for a few days.  Please Note:  You might notice some irritation and congestion in your nose or some drainage.  This is from the oxygen used during your procedure.  There is no need for concern and it should clear up in a day or so.  SYMPTOMS TO REPORT IMMEDIATELY:   Following lower endoscopy (colonoscopy or flexible sigmoidoscopy):  Excessive amounts of blood in the stool  Significant tenderness or worsening of abdominal pains  Swelling of the abdomen that is new, acute  Fever of 100F or higher   For urgent or emergent issues, a gastroenterologist can be reached at any hour by calling [phone]. Do not use MyChart messaging for urgent concerns.    DIET:  We do recommend a small meal at first, but then you may proceed to your regular diet.  Drink plenty of fluids but you should avoid alcoholic beverages for 24  hours.  ACTIVITY:  You should plan to take it easy for the rest of today and you should NOT DRIVE or use heavy machinery until tomorrow (because of the sedation medicines used during the test).    FOLLOW UP: Our staff will call the number listed on your records 48-72 hours following your procedure to check on you and address any questions or concerns that you may have regarding the information given to you following your procedure. If we do not reach you, we will leave a message.  We will attempt to reach you two times.  During this call, we will ask if you have developed any symptoms of COVID 19. If you develop any symptoms (ie: fever, flu-like symptoms, shortness of breath, cough etc.) before then, please call [phone].  If you test positive for Covid 19 in the 2 weeks post procedure, please call and report this information to [place].    If any biopsies were taken you will be contacted by phone or by letter within the next 1-3 weeks.  Please call us at [phone] if you have not heard about the biopsies in 3 weeks.    SIGNATURES/CONFIDENTIALITY: You and/or your care partner have signed paperwork which will be entered into your electronic medical record.  These signatures attest to the fact that that the information above on your After Visit Summary has been reviewed and is understood.  Full responsibility of the confidentiality of this discharge information  lies with you and/or your care-partner.

## 2020-05-17 NOTE — Progress Notes (Signed)
A and O x3. Report to RN. Tolerated MAC anesthesia well.

## 2020-05-17 NOTE — Progress Notes (Signed)
Pt's states no medical or surgical changes since previsit or office visit. 

## 2020-05-17 NOTE — Progress Notes (Signed)
Called to room to assist during endoscopic procedure.  Patient ID and intended procedure confirmed with present staff. Received instructions for my participation in the procedure from the performing physician.  

## 2020-05-17 NOTE — Op Note (Signed)
[place] Patient Name: Bruce Kelly Procedure Date: 05/17/2020 8:26 AM MRN: [id number] Endoscopist: Mauri Pole , MD Age: 59 Referring MD:  Date of Birth: 1961-05-22 Gender: Male Account #: [id number] Procedure:                Colonoscopy Indications:              Screening for colorectal malignant neoplasm Medicines:                Monitored Anesthesia Care Procedure:                Pre-Anesthesia Assessment:                           - Prior to the procedure, a History and Physical                            was performed, and patient medications and                            allergies were reviewed. The patient's tolerance of                            previous anesthesia was also reviewed. The risks                            and benefits of the procedure and the sedation                            options and risks were discussed with the patient.                            All questions were answered, and informed consent                            was obtained. Prior Anticoagulants: The patient has                            taken no previous anticoagulant or antiplatelet                            agents. ASA Grade Assessment: III - A patient with                            severe systemic disease. After reviewing the risks                            and benefits, the patient was deemed in                            satisfactory condition to undergo the procedure.                           After obtaining informed consent, the colonoscope  was passed under direct vision. Throughout the                            procedure, the patient's blood pressure, pulse, and                            oxygen saturations were monitored continuously. The                            Colonoscope was introduced through the anus and                            advanced to the the cecum, identified by                            appendiceal orifice  and ileocecal valve. The                            colonoscopy was performed without difficulty. The                            patient tolerated the procedure well. The quality                            of the bowel preparation was excellent. The                            ileocecal valve, appendiceal orifice, and rectum                            were photographed. Scope In: 8:38:38 AM Scope Out: 8:50:21 AM Scope Withdrawal Time: 0 hours 7 minutes 58 seconds  Total Procedure Duration: 0 hours 11 minutes 43 seconds  Findings:                 The perianal and digital rectal examinations were                            normal.                           A 4 mm polyp was found in the appendiceal orifice.                            The polyp was sessile. The polyp was removed with a                            cold snare. Resection and retrieval were complete.                           Non-bleeding internal hemorrhoids were found during                            retroflexion. The hemorrhoids were medium-sized. Complications:  No immediate complications. Estimated Blood Loss:     Estimated blood loss was minimal. Impression:               - One 4 mm polyp at the appendiceal orifice,                            removed with a cold snare. Resected and retrieved.                           - Non-bleeding internal hemorrhoids. Recommendation:           - Patient has a contact number available for                            emergencies. The signs and symptoms of potential                            delayed complications were discussed with the                            patient. Return to normal activities tomorrow.                            Written discharge instructions were provided to the                            patient.                           - Resume previous diet.                           - Continue present medications.                           - Await pathology  results.                           - Repeat colonoscopy in 5-10 years for surveillance                            based on pathology results. Mauri Pole, MD 05/17/2020 8:56:07 AM This report has been signed electronically.

## 2020-05-19 ENCOUNTER — Telehealth: Payer: Self-pay

## 2020-05-19 NOTE — Telephone Encounter (Signed)
Covid-19 screening questions   Do you now or have you had a fever in the last 14 days? No.  Do you have any respiratory symptoms of shortness of breath or cough now or in the last 14 days? No.  Do you have any family members or close contacts with diagnosed or suspected Covid-19 in the past 14 days? No.  Have you been tested for Covid-19 and found to be positive? No.      Follow up Call-  Call back number 05/17/2020  Post procedure Call Back phone  # [phone]  Permission to leave phone message Yes  Some recent data might be hidden     Patient questions:  Do you have a fever, pain , or abdominal swelling? No. Pain Score  0 *  Have you tolerated food without any problems? Yes.    Have you been able to return to your normal activities? Yes.    Do you have any questions about your discharge instructions: Diet   No. Medications  No. Follow up visit  No.  Do you have questions or concerns about your Care? No.  Actions: * If pain score is 4 or above: No action needed, pain <4.

## 2020-05-23 ENCOUNTER — Encounter: Payer: Self-pay | Admitting: Gastroenterology

## 2020-07-07 ENCOUNTER — Other Ambulatory Visit: Payer: Self-pay | Admitting: Family Medicine

## 2020-07-07 DIAGNOSIS — E119 Type 2 diabetes mellitus without complications: Secondary | ICD-10-CM

## 2020-07-20 ENCOUNTER — Encounter: Payer: Self-pay | Admitting: Family Medicine

## 2020-07-20 ENCOUNTER — Ambulatory Visit (INDEPENDENT_AMBULATORY_CARE_PROVIDER_SITE_OTHER): Payer: BC Managed Care – PPO | Admitting: Family Medicine

## 2020-07-20 ENCOUNTER — Other Ambulatory Visit: Payer: Self-pay

## 2020-07-20 VITALS — BP 120/77 | HR 60 | Temp 98.0°F | Ht 72.0 in | Wt 206.0 lb

## 2020-07-20 DIAGNOSIS — E119 Type 2 diabetes mellitus without complications: Secondary | ICD-10-CM | POA: Diagnosis not present

## 2020-07-20 DIAGNOSIS — M8580 Other specified disorders of bone density and structure, unspecified site: Secondary | ICD-10-CM

## 2020-07-20 DIAGNOSIS — M0579 Rheumatoid arthritis with rheumatoid factor of multiple sites without organ or systems involvement: Secondary | ICD-10-CM | POA: Diagnosis not present

## 2020-07-20 DIAGNOSIS — E559 Vitamin D deficiency, unspecified: Secondary | ICD-10-CM

## 2020-07-20 DIAGNOSIS — I701 Atherosclerosis of renal artery: Secondary | ICD-10-CM | POA: Diagnosis not present

## 2020-07-20 DIAGNOSIS — D696 Thrombocytopenia, unspecified: Secondary | ICD-10-CM

## 2020-07-20 DIAGNOSIS — E785 Hyperlipidemia, unspecified: Secondary | ICD-10-CM

## 2020-07-20 DIAGNOSIS — K219 Gastro-esophageal reflux disease without esophagitis: Secondary | ICD-10-CM

## 2020-07-20 DIAGNOSIS — Z79899 Other long term (current) drug therapy: Secondary | ICD-10-CM

## 2020-07-20 DIAGNOSIS — E663 Overweight: Secondary | ICD-10-CM

## 2020-07-20 LAB — POCT GLYCOSYLATED HEMOGLOBIN (HGB A1C)
HbA1c POC (<> result, manual entry): 6.8 % (ref 4.0–5.6)
HbA1c, POC (controlled diabetic range): 6.8 % (ref 0.0–7.0)
HbA1c, POC (prediabetic range): 6.8 % — AB (ref 5.7–6.4)
Hemoglobin A1C: 6.8 % — AB (ref 4.0–5.6)

## 2020-07-20 MED ORDER — OMEPRAZOLE 20 MG PO CPDR: 20.0000 mg | DELAYED_RELEASE_CAPSULE | Freq: Every day | ORAL | 1 refills | Status: DC

## 2020-07-20 MED ORDER — METFORMIN HCL 500 MG PO TABS: ORAL_TABLET | ORAL | 1 refills | Status: DC

## 2020-07-20 NOTE — Patient Instructions (Addendum)
Great to see you today.  Next appt in 5.5 mos (end of June).

## 2020-07-20 NOTE — Progress Notes (Signed)
This visit occurred during the SARS-CoV-2 public health emergency.  Safety protocols were in place, including screening questions prior to the visit, additional usage of staff PPE, and extensive cleaning of exam room while observing appropriate contact time as indicated for disinfecting solutions.    Patient ID: Bruce Kelly, male  DOB: Mar 14, 1961, 60 y.o.   MRN: [id number] Patient Care Team    Relationship Specialty Notifications Start End  [name], DO PCP - General Family Medicine  11/21/16   [name], MD PCP - Electrophysiology Cardiology Admissions 12/24/17   [name]  11/22/16   [facility] Staff  Family Medicine  09/26/17   [name], MD Consulting Physician Cardiology  12/06/17     Chief Complaint  Patient presents with  . Follow-up    [facility]; pt is fasting     Subjective: Bruce Kelly is a 60 y.o. male present for [facility] History of atrialfibrillation s/p ablation/hyperlipidemia: Patient reports he is doing well s/p his ablation 02/2018.Marland Kitchen   He is not no longer in need of amiodarone.  He does report compliance e with Crestor qod  Osteopenia/vitamin D deficiency:   DEXA   2020 with osteopenia.  He is supplementing with vit d.   GERD: Prilosec 20 mg daily controls symptoms   Rheumatoid arthritis: He reports overall his rheumatoid arthritis is stable.Established with rheumatology.  Currently prescribed Simponi, methotrexate, folate and Mobic by rheumatology.  Diabetes:Patient reports  compliance with metformin 1000/500 with meds. Diagnosed with diabetes May 2018,prediabetic for some time. He has attended the nutrition classes. Patient denies dizziness, hyperglycemic or hypoglycemic events. Patient denies numbness, tingling in the extremities or nonhealing wounds of feet.  PNA series:Prevnar 1302/21/2019,Pneumovax completed 08/13/2018 Flu shot: Flu shot (recommneded yearly) Urine microalbumin: UTD  02/04/2020 Foot exam:Completed 04/2020 Eye exam: UTD 09/07/2019, [facility].   Depression screen [facility] 2/9 07/20/2020 02/04/2020 12/05/2017 04/15/2017 12/20/2016  Decreased Interest 0 0 0 0 0  Down, Depressed, Hopeless 0 0 0 0 0  PHQ - 2 Score 0 0 0 0 0   No flowsheet data found.   Past Medical History:  Diagnosis Date  . Closed right ankle fracture   . Colon polyp 2011   x3 polyps, pts reports 10 year follow up  . 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 ED (erectile dysfunction) 09/23/2017  . Elevated ferritin 2013   479 -->  return to normal  . Encounter for long-term (current) use of other medications 09/23/2017  . Erectile dysfunction 2013   Had been on Cialis  . Generalized osteoarthritis   . GERD (gastroesophageal reflux disease) 2013  . Hyperlipidemia LDL goal <130   . Irregular heart rate 09/23/2017  . Neuropathy 09/23/2017  . On statin therapy 04/15/2017  . Osteopenia 2013   prior h/o of Chronic steroids for 10 years; was on Boniva for 6 months. Now takes calcium/vitamin D daily.  . Osteopenia of multiple sites   . Other and unspecified hyperlipidemia 09/23/2017  . Overweight (BMI 25.0-29.9) 11/22/2016  . Peripheral neuropathy 2009   Did not respond to gabapentin by prior records  . Prediabetes   . RA (rheumatoid arthritis) ([place]) 09/23/2017  . Rheumatoid arthritis ([place]) 1998   had been on chronic steroids for about 10 years  . Rheumatoid lung ([place])    pt reports having thoracentesis for rheumatoid lung many years ago.   . 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  Thrombocytopenia ([place]) 2013   Mildly low at 130  . Type 2 diabetes mellitus without complication, without long-term current use of insulin ([place]) 11/22/2016  . Vitamin D deficiency 11/22/2016  . Wears glasses    [facility]   Allergies  Allergen Reactions  . Penicillins Other (See Comments)    Doesn't know  . Sulfa Antibiotics Other (See Comments)    Doesn't know   Past  Surgical History:  Procedure Laterality Date  . ABLATION OF DYSRHYTHMIC FOCUS  02/07/2018  . ATRIAL FIBRILLATION ABLATION N/A 02/07/2018   Procedure: ATRIAL FIBRILLATION ABLATION;  Surgeon: [name], MD;  Location: [facility];  Service: Cardiovascular;  Laterality: N/A;  . COLONOSCOPY    . COLONOSCOPY    . NO PAST SURGERIES     Family History  Problem Relation Age of Onset  . Arthritis Mother   . Diabetes Father   . Heart disease Father   . Arthritis Sister   . Diabetes Sister   . Alcohol abuse Brother   . Drug abuse Brother   . Breast cancer Paternal Aunt   . Early death Maternal Grandfather   . Colon cancer Neg Hx   . Esophageal cancer Neg Hx   . Stomach cancer Neg Hx   . Rectal cancer Neg Hx    Social History   Social History Narrative   Drinks caffeine, takes a daily vitamin.   Masters degree, Materials engineer.   Exercises routinely.   Wears his seatbelt, firearms locked in the home.   Feels  safe in his relationships.    Allergies as of 07/20/2020      Reactions   Penicillins Other (See Comments)   Doesn't know   Sulfa Antibiotics Other (See Comments)   Doesn't know      Medication List       Accurate as of July 20, 2020  3:43 PM. If you have any questions, ask your nurse or doctor.        STOP taking these medications   ciclopirox 0.77 % cream Commonly known as: Loprox Stopped by: Howard Pouch, DO   ketoconazole 2 % cream Commonly known as: NIZORAL Stopped by: Howard Pouch, DO     TAKE these medications   aspirin 81 MG chewable tablet Chew by mouth daily.   b complex vitamins capsule Take 1 capsule by mouth daily.   calcium-vitamin D 500-200 MG-UNIT tablet Commonly known as: OSCAL WITH D Take 1 tablet by mouth daily with breakfast.   folic acid 1 MG tablet Commonly known as: FOLVITE Take 1 mg by mouth daily.   meloxicam 15 MG tablet Commonly known as: MOBIC Take 15 mg by mouth daily. What changed: Another medication  with the same name was removed. Continue taking this medication, and follow the directions you see here. Changed by: Howard Pouch, DO   metFORMIN 500 MG tablet Commonly known as: GLUCOPHAGE TAKE 2 TABLETS EVERY MORNING AND 1 TABLET IN THE AFTERNOON. What changed: additional instructions Changed by: Howard Pouch, DO   methotrexate 2.5 MG tablet Commonly known as: RHEUMATREX Take 4 tablets by mouth once a week.   multivitamin capsule Take 1 capsule by mouth daily. Takes womens for extra calcium   omeprazole 20 MG capsule Commonly known as: PRILOSEC Take 1 capsule (20 mg total) by mouth daily.   rosuvastatin 5 MG tablet Commonly known as: CRESTOR TAKE 1 TABLET THREE TIMES  WEEKLY   Simponi 50 MG/0.5ML [place] Generic drug: Golimumab Inject 50 Units  into the skin every 30 (thirty) days.      All past medical history, surgical history, allergies, family history, immunizations andmedications were updated in the EMR today and reviewed under the history and medication portions of their EMR.     Recent Results (from the past 2160 hour(s))  POCT HgB A1C     Status: Abnormal   Collection Time: 07/20/20  9:36 AM  Result Value Ref Range   Hemoglobin A1C 6.8 (A) 4.0 - 5.6 %   HbA1c POC (<> result, manual entry) 6.8 4.0 - 5.6 %   HbA1c, POC (prediabetic range) 6.8 (A) 5.7 - 6.4 %   HbA1c, POC (controlled diabetic range) 6.8 0.0 - 7.0 %    ROS: 14 pt review of systems performed and negative (unless mentioned in an HPI)  Objective: BP 120/77   Pulse 60   Temp 98 F (36.7 C) (Oral)   Ht 6' (1.829 m)   Wt 206 lb (93.4 kg)   SpO2 98%   BMI 27.94 kg/m  Gen: Afebrile. No acute distress.  HENT: AT. [place].  Eyes:Pupils Equal Round Reactive to light, Extraocular movements intact,  Conjunctiva without redness, discharge or icterus. Neck/lymp/endocrine: Supple,no lymphadenopathy, no thyromegaly CV: RRR no murmur, no edema, +2/4 P posterior tibialis pulses Chest: CTAB, no wheeze or  crackles Skin: no rashes, purpura or petechiae.  Neuro: Normal gait. PERLA. EOMi. Alert. Oriented x3 Psych: Normal affect, dress and demeanor. Normal speech. Normal thought content and judgment.    No exam data present  Assessment/plan: Bruce Kelly is a 60 y.o. male present for CPE Type 2 diabetes mellitus without complication, without long-term current use of insulin ([place]) -Stable.A1c 5.8-->6.4--> 6.1--> 6.1>>6.2>7.2>  6.8  today.  - continue withmetformin 1000/500 mg twice a day. PNA series:Prevnar 1302/21/2019,Pneumovax completed 08/13/2018 Flu shot: Flu shot (recommneded yearly) Urine microalbumin: UTD 02/04/2020 Foot exam:Completed 04/2020 Eye exam: UTD 09/07/2019, [facility]. - f/u 5.5 mos  Hyperlipidemia LDL goal <130/On statin therapy/Overweight (BMI 25.0-29.9)/Thrombocytopenia (HCC)/Status post ablation of atrial fibrillation - Stable.   Resolution of atrial fibrillation after ablation. - labs UTD 02/04/2020 - continue  crestor 5 mg QOD.   Gastroesophageal reflux disease, esophagitis presence not specified -stable.  continue omeprazole 20 mg   Rheumatoid arthritis with positive rheumatoid factor, involving unspecified site (HCC)/ High risk medication use -stable. Marland Kitchen   He is on a regimen of methotrexate, Simponi, folate and Mobic through his rheumatologist. B12, folate have been normal. Labs UTD 02/04/2020  Vitamin D deficiency/osteopenia Supplementing dexa due 2025 (rheum) Stable - labs UTD 02/04/2020   Return in about 5 months (around 01/03/2021) for [facility] (30 min).  Orders Placed This Encounter  Procedures  . POCT HgB A1C   Meds ordered this encounter  Medications  . omeprazole (PRILOSEC) 20 MG capsule    Sig: Take 1 capsule (20 mg total) by mouth daily.    Dispense:  90 capsule    Refill:  1  . metFORMIN (GLUCOPHAGE) 500 MG tablet    Sig: TAKE 2 TABLETS EVERY MORNING AND 1 TABLET IN THE AFTERNOON.    Dispense:  270 tablet    Refill:  1    Referral Orders  No referral(s) requested today    Preventive exam completed today with and additional > 20 Minutes was dedicated to this patient's encounter to include pre-visit review of chart, face-to-face time with patient and post-visit work- which include documentation and prescribing medications and/or ordering test when necessary for more than one chronic medical  condition.    Note is dictated utilizing voice recognition software. Although note has been proof read prior to signing, occasional typographical errors still can be missed. If any questions arise, please do not hesitate to call for verification.  Electronically signed by: Howard Pouch, DO [place]

## 2020-07-21 DIAGNOSIS — M0579 Rheumatoid arthritis with rheumatoid factor of multiple sites without organ or systems involvement: Secondary | ICD-10-CM | POA: Diagnosis not present

## 2020-07-21 DIAGNOSIS — Z79899 Other long term (current) drug therapy: Secondary | ICD-10-CM | POA: Diagnosis not present

## 2020-07-21 DIAGNOSIS — I499 Cardiac arrhythmia, unspecified: Secondary | ICD-10-CM | POA: Diagnosis not present

## 2020-07-21 DIAGNOSIS — M150 Primary generalized (osteo)arthritis: Secondary | ICD-10-CM | POA: Diagnosis not present

## 2020-08-15 DIAGNOSIS — Z20822 Contact with and (suspected) exposure to covid-19: Secondary | ICD-10-CM | POA: Diagnosis not present

## 2020-08-23 DIAGNOSIS — Z20822 Contact with and (suspected) exposure to covid-19: Secondary | ICD-10-CM | POA: Diagnosis not present

## 2020-09-16 DIAGNOSIS — Z7984 Long term (current) use of oral hypoglycemic drugs: Secondary | ICD-10-CM | POA: Diagnosis not present

## 2020-09-16 DIAGNOSIS — M0609 Rheumatoid arthritis without rheumatoid factor, multiple sites: Secondary | ICD-10-CM | POA: Diagnosis not present

## 2020-09-16 DIAGNOSIS — Z79899 Other long term (current) drug therapy: Secondary | ICD-10-CM | POA: Diagnosis not present

## 2020-09-16 DIAGNOSIS — E119 Type 2 diabetes mellitus without complications: Secondary | ICD-10-CM | POA: Diagnosis not present

## 2020-09-16 LAB — HM DIABETES EYE EXAM

## 2020-10-19 DIAGNOSIS — M0579 Rheumatoid arthritis with rheumatoid factor of multiple sites without organ or systems involvement: Secondary | ICD-10-CM | POA: Diagnosis not present

## 2020-12-28 DIAGNOSIS — I499 Cardiac arrhythmia, unspecified: Secondary | ICD-10-CM | POA: Diagnosis not present

## 2020-12-28 DIAGNOSIS — M150 Primary generalized (osteo)arthritis: Secondary | ICD-10-CM | POA: Diagnosis not present

## 2020-12-28 DIAGNOSIS — M0579 Rheumatoid arthritis with rheumatoid factor of multiple sites without organ or systems involvement: Secondary | ICD-10-CM | POA: Diagnosis not present

## 2020-12-28 DIAGNOSIS — R5383 Other fatigue: Secondary | ICD-10-CM | POA: Diagnosis not present

## 2020-12-28 DIAGNOSIS — Z79899 Other long term (current) drug therapy: Secondary | ICD-10-CM | POA: Diagnosis not present

## 2021-01-11 ENCOUNTER — Other Ambulatory Visit: Payer: Self-pay | Admitting: Family Medicine

## 2021-01-11 DIAGNOSIS — E119 Type 2 diabetes mellitus without complications: Secondary | ICD-10-CM

## 2021-01-19 ENCOUNTER — Other Ambulatory Visit: Payer: Self-pay | Admitting: Family Medicine

## 2021-01-19 DIAGNOSIS — E785 Hyperlipidemia, unspecified: Secondary | ICD-10-CM

## 2021-02-06 ENCOUNTER — Ambulatory Visit (INDEPENDENT_AMBULATORY_CARE_PROVIDER_SITE_OTHER): Payer: BC Managed Care – PPO | Admitting: Family Medicine

## 2021-02-06 ENCOUNTER — Other Ambulatory Visit: Payer: Self-pay

## 2021-02-06 ENCOUNTER — Encounter: Payer: Self-pay | Admitting: Family Medicine

## 2021-02-06 VITALS — BP 108/70 | HR 68 | Temp 97.5°F | Wt 207.0 lb

## 2021-02-06 DIAGNOSIS — E11628 Type 2 diabetes mellitus with other skin complications: Secondary | ICD-10-CM

## 2021-02-06 DIAGNOSIS — B353 Tinea pedis: Secondary | ICD-10-CM

## 2021-02-06 DIAGNOSIS — L089 Local infection of the skin and subcutaneous tissue, unspecified: Secondary | ICD-10-CM

## 2021-02-06 MED ORDER — DOXYCYCLINE HYCLATE 100 MG PO TABS: 100.0000 mg | ORAL_TABLET | Freq: Two times a day (BID) | ORAL | 0 refills | Status: DC

## 2021-02-06 MED ORDER — MUPIROCIN CALCIUM 2 % EX CREA: 1.0000 "application " | TOPICAL_CREAM | Freq: Two times a day (BID) | CUTANEOUS | 0 refills | Status: DC

## 2021-02-06 MED ORDER — CLOTRIMAZOLE 1 % EX CREA: 1.0000 "application " | TOPICAL_CREAM | Freq: Two times a day (BID) | CUTANEOUS | 1 refills | Status: DC

## 2021-02-06 MED ORDER — CLOTRIMAZOLE-BETAMETHASONE 1-0.05 % EX CREA: 1.0000 "application " | TOPICAL_CREAM | Freq: Every day | CUTANEOUS | 1 refills | Status: DC

## 2021-02-06 NOTE — Progress Notes (Signed)
This visit occurred during the SARS-CoV-2 public health emergency.  Safety protocols were in place, including screening questions prior to the visit, additional usage of staff PPE, and extensive cleaning of exam room while observing appropriate contact time as indicated for disinfecting solutions.    Bruce Kelly , May 25, 1961, 60 y.o., male MRN: [id number] Patient Care Team    Relationship Specialty Notifications Start End  [name], DO PCP - General Family Medicine  11/21/16   [name], MD PCP - Electrophysiology Cardiology Admissions 12/24/17   [name]  11/22/16   [name], PA-C (Inactive)  Family Medicine  09/26/17   [name], MD Consulting Physician Cardiology  12/06/17     Chief Complaint  Patient presents with   Foot Injury    Pt reports a small opening between 7th and 5th; no current redness, pt c/o pain with touch and walking; first noticed yesterday after a 3 day hike      Subjective: Pt presents for an OV with complaints of foot wound of his right foot between his 4-5th toe.  He denies fever or chills.  He noticed a small amount of dried blood on his toe which caused him to investigate further.  He was not having any pain.  He did not see any puslike drainage.  He is a diabetic last A1c 6.8. He reports his skin on that foot will peel intermittently. He had been at a all weekend music festival this past weekend and was on his feet a great deal.  He denies any notable trauma.  Depression screen [facility] 2/9 07/20/2020 02/04/2020 12/05/2017 04/15/2017 12/20/2016  Decreased Interest 0 0 0 0 0  Down, Depressed, Hopeless 0 0 0 0 0  PHQ - 2 Score 0 0 0 0 0    Allergies  Allergen Reactions   Penicillins Other (See Comments)    Doesn't know   Sulfa Antibiotics Other (See Comments)    Doesn't know   Social History   Social History Narrative   Drinks caffeine, takes a daily vitamin.   Masters degree, [employer].   Exercises routinely.   Wears his seatbelt, firearms locked in the home.   Feels  safe in his relationships.   Past Medical History:  Diagnosis Date   Closed right ankle fracture    Colon polyp 2011   x3 polyps, pts reports 10 year follow up   Delayed union of metatarsal fracture, left 03/2014   No hardware or surgery   ED (erectile dysfunction) 09/23/2017   Elevated ferritin 2013   479 -->  return to normal   Encounter for long-term (current) use of other medications 09/23/2017   Erectile dysfunction 2013   Had been on Cialis   Generalized osteoarthritis    GERD (gastroesophageal reflux disease) 2013   Hyperlipidemia LDL goal <130    Irregular heart rate 09/23/2017   Neuropathy 09/23/2017   On statin therapy 04/15/2017   Osteopenia 2013   prior h/o of Chronic steroids for 10 years; was on Boniva for 6 months. Now takes calcium/vitamin D daily.   Osteopenia of multiple sites    Other and unspecified hyperlipidemia 09/23/2017   Overweight (BMI 25.0-29.9) 11/22/2016   Peripheral neuropathy 2009   Did not respond to gabapentin by prior records   Prediabetes    RA (rheumatoid arthritis) ([place]) 09/23/2017   Rheumatoid arthritis ([place]) 1998   had been on chronic steroids for about 10  years   Rheumatoid lung ([place])    pt reports having thoracentesis for rheumatoid lung many years ago.    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[place])    Thrombocytopenia ([place]) 2013   Mildly low at 130   Type 2 diabetes mellitus without complication, without long-term current use of insulin ([place]) 11/22/2016   Vitamin D deficiency 11/22/2016   Wears glasses    [facility]   Past Surgical History:  Procedure Laterality Date   ABLATION OF DYSRHYTHMIC FOCUS  02/07/2018   ATRIAL FIBRILLATION ABLATION N/A 02/07/2018   Procedure: ATRIAL FIBRILLATION ABLATION;  Surgeon: [name], MD;  Location: [facility];  Service: Cardiovascular;  Laterality: N/A;   COLONOSCOPY     COLONOSCOPY     NO PAST  SURGERIES     Family History  Problem Relation Age of Onset   Arthritis Mother    Diabetes Father    Heart disease Father    Arthritis Sister    Diabetes Sister    Alcohol abuse Brother    Drug abuse Brother    Breast cancer Paternal Aunt    Early death Maternal Grandfather    Colon cancer Neg Hx    Esophageal cancer Neg Hx    Stomach cancer Neg Hx    Rectal cancer Neg Hx    Allergies as of 02/06/2021       Reactions   Penicillins Other (See Comments)   Doesn't know   Sulfa Antibiotics Other (See Comments)   Doesn't know        Medication List        Accurate as of February 06, 2021  4:14 PM. If you have any questions, ask your nurse or doctor.          STOP taking these medications    Simponi 50 MG/0.5ML [place] Generic drug: Golimumab Stopped by: Howard Pouch, DO       TAKE these medications    aspirin 81 MG chewable tablet Chew by mouth daily.   b complex vitamins capsule Take 1 capsule by mouth daily.   calcium-vitamin D 500-200 MG-UNIT tablet Commonly known as: OSCAL WITH D Take 1 tablet by mouth daily with breakfast.   clotrimazole 1 % cream Commonly known as: Clotrimazole Anti-Fungal Apply 1 application topically 2 (two) times daily. Started by: Howard Pouch, DO   doxycycline 100 MG tablet Commonly known as: VIBRA-TABS Take 1 tablet (100 mg total) by mouth 2 (two) times daily. Started by: Howard Pouch, DO   folic acid 1 MG tablet Commonly known as: FOLVITE Take 1 mg by mouth daily.   Humira Pen 40 MG/0.4ML Pnkt Generic drug: Adalimumab SMARTSIG:40 Milligram(s) SUB-Q Every 2 Weeks   meloxicam 15 MG tablet Commonly known as: MOBIC Take 15 mg by mouth daily.   metFORMIN 500 MG tablet Commonly known as: GLUCOPHAGE TAKE 2 TABLETS EVERY       MORNING AND TAKE 1 TABLET  IN THE AFTERNOON   methotrexate 2.5 MG tablet Commonly known as: RHEUMATREX Take 4 tablets by mouth once a week.   multivitamin capsule Take 1 capsule by mouth daily.  Takes womens for extra calcium   mupirocin cream 2 % Commonly known as: Bactroban Apply 1 application topically 2 (two) times daily. Started by: Howard Pouch, DO   omeprazole 20 MG capsule Commonly known as: PRILOSEC Take 1 capsule (20 mg total) by mouth daily.   rosuvastatin 5 MG tablet Commonly known as: CRESTOR TAKE 1 TABLET THREE TIMES  WEEKLY  All past medical history, surgical history, allergies, family history, immunizations andmedications were updated in the EMR today and reviewed under the history and medication portions of their EMR.     ROS: Negative, with the exception of above mentioned in HPI   Objective:  BP 108/70   Pulse 68   Temp (!) 97.5 F (36.4 C) (Oral)   Wt 207 lb (93.9 kg)   SpO2 96%   BMI 28.07 kg/m  Body mass index is 28.07 kg/m. Gen: Afebrile. No acute distress. Nontoxic in appearance, well developed, well nourished.  HENT: AT. [place].  Eyes:Pupils Equal Round Reactive to light, Extraocular movements intact,  Conjunctiva without redness, discharge or icterus. MSK/skin: Left foot without erythema.  Small wound noted between fourth-fifth toe, consistent with blister or rubbing.  Small amount of white exudative/flaky skin between third-fourth toe, with very small skin loss in this area.  Flaky dry skin along the heel and arch of foot.  Right foot also with small amount of interdigital scales. Neuro: Normal gait. PERLA. EOMi. Alert. Oriented x3  Psych: Normal affect, dress and demeanor. Normal speech. Normal thought content and judgment.  No results found. No results found. No results found for this or any previous visit (from the past 24 hour(s)).  Assessment/Plan: Bruce Kelly is a 60 y.o. male present for OV for  Tinea pedis of both feet/Diabetic foot infection ([place]) Suspect tinea pedis as the root cause of his infection.  The fissures or erosions after being on feet long periods of time over the past week and has called erosions to  increase. For now we will treat as a diabetic foot infection with doxycycline twice daily x7 days since he is allergic to penicillin and sulfa. Bactroban twice daily to erosions Clotrimazole twice daily to bilateral feet for 7-14 days. May need oral terbinafine to completely treat tinea, for now we will refocus on ensuring no superinfection is present.  He will follow-up in 3 weeks, sooner if worsening.  He has a chronic medical condition appointment scheduled at that time.  Consider starting terbinafine at that time.  Reviewed expectations re: course of current medical issues. Discussed self-management of symptoms. Outlined signs and symptoms indicating need for more acute intervention. Patient verbalized understanding and all questions were answered. Patient received an After-Visit Summary.    No orders of the defined types were placed in this encounter.  Meds ordered this encounter  Medications   doxycycline (VIBRA-TABS) 100 MG tablet    Sig: Take 1 tablet (100 mg total) by mouth 2 (two) times daily.    Dispense:  14 tablet    Refill:  0   mupirocin cream (BACTROBAN) 2 %    Sig: Apply 1 application topically 2 (two) times daily.    Dispense:  15 g    Refill:  0   DISCONTD: clotrimazole-betamethasone (LOTRISONE) cream    Sig: Apply 1 application topically daily.    Dispense:  45 g    Refill:  1   clotrimazole (CLOTRIMAZOLE ANTI-FUNGAL) 1 % cream    Sig: Apply 1 application topically 2 (two) times daily.    Dispense:  60 g    Refill:  1    Please disregard clotrimazole- betameth script.   Referral Orders  No referral(s) requested today     Note is dictated utilizing voice recognition software. Although note has been proof read prior to signing, occasional typographical errors still can be missed. If any questions arise, please do not hesitate to call for  verification.   electronically signed by:  Howard Pouch, DO  [place]

## 2021-02-06 NOTE — Patient Instructions (Signed)
Athlete's Foot  Athlete's foot (tinea pedis) is a fungal infection of the skin on your feet. It often occurs on the skin that is between or underneath your toes. It can also occur on the soles of your feet. Symptoms include itchy or white and flaky areas on the skin. The infection can spread from person to person (is contagious). It can also spread when a person's bare feet come in contact with the funguson shower floors or on items such as shoes. Follow these instructions at home: Medicines Apply or take over-the-counter and prescription medicines only as told by your doctor. Apply your antifungal medicine as told by your doctor. Do not stop using the medicine even if your feet start to get better. Foot care Do not scratch your feet. Keep your feet dry: Wear cotton or wool socks. Change your socks every day or if they become wet. Wear shoes that allow air to move around, such as sandals or canvas tennis shoes. Wash and dry your feet: Every day or as told by your doctor. After exercising. Including the area between your toes. General instructions Do not share any of these items that touch your feet: Towels. Shoes. Nail clippers. Other personal items. Protect your feet by wearing sandals in wet areas, such as locker rooms and shared showers. Keep all follow-up visits as told by your doctor. This is important. If you have diabetes, keep your blood sugar under control. Contact a doctor if: You have a fever. You have swelling, pain, warmth, or redness in your foot. Your feet are not getting better with treatment. Your symptoms get worse. You have new symptoms. Summary Athlete's foot is a fungal infection of the skin on your feet. Symptoms include itchy or white and flaky areas on the skin. Apply your antifungal medicine as told by your doctor. Keep your feet clean and dry. This information is not intended to replace advice given to you by your health care provider. Make sure you  discuss any questions you have with your healthcare provider. Document Revised: 02/11/2020 Document Reviewed: 02/11/2020 Elsevier Patient Education  2022 [employer].

## 2021-02-17 ENCOUNTER — Other Ambulatory Visit: Payer: Self-pay | Admitting: Family Medicine

## 2021-02-17 DIAGNOSIS — E785 Hyperlipidemia, unspecified: Secondary | ICD-10-CM

## 2021-03-03 ENCOUNTER — Other Ambulatory Visit: Payer: Self-pay | Admitting: Family Medicine

## 2021-03-03 DIAGNOSIS — K219 Gastro-esophageal reflux disease without esophagitis: Secondary | ICD-10-CM

## 2021-03-07 ENCOUNTER — Other Ambulatory Visit: Payer: Self-pay | Admitting: Family Medicine

## 2021-03-07 DIAGNOSIS — E119 Type 2 diabetes mellitus without complications: Secondary | ICD-10-CM

## 2021-03-09 ENCOUNTER — Other Ambulatory Visit: Payer: Self-pay | Admitting: Family Medicine

## 2021-03-09 DIAGNOSIS — E785 Hyperlipidemia, unspecified: Secondary | ICD-10-CM

## 2021-03-21 ENCOUNTER — Ambulatory Visit: Payer: BC Managed Care – PPO | Admitting: Family Medicine

## 2021-03-26 ENCOUNTER — Other Ambulatory Visit: Payer: Self-pay | Admitting: Family Medicine

## 2021-03-26 DIAGNOSIS — K219 Gastro-esophageal reflux disease without esophagitis: Secondary | ICD-10-CM

## 2021-03-29 ENCOUNTER — Ambulatory Visit (INDEPENDENT_AMBULATORY_CARE_PROVIDER_SITE_OTHER): Payer: BC Managed Care – PPO | Admitting: Family Medicine

## 2021-03-29 ENCOUNTER — Other Ambulatory Visit: Payer: Self-pay

## 2021-03-29 ENCOUNTER — Encounter: Payer: Self-pay | Admitting: Family Medicine

## 2021-03-29 VITALS — BP 122/70 | HR 58 | Temp 97.6°F | Wt 204.8 lb

## 2021-03-29 DIAGNOSIS — Z79899 Other long term (current) drug therapy: Secondary | ICD-10-CM

## 2021-03-29 DIAGNOSIS — Z9889 Other specified postprocedural states: Secondary | ICD-10-CM

## 2021-03-29 DIAGNOSIS — E663 Overweight: Secondary | ICD-10-CM

## 2021-03-29 DIAGNOSIS — E785 Hyperlipidemia, unspecified: Secondary | ICD-10-CM

## 2021-03-29 DIAGNOSIS — K219 Gastro-esophageal reflux disease without esophagitis: Secondary | ICD-10-CM | POA: Diagnosis not present

## 2021-03-29 DIAGNOSIS — B353 Tinea pedis: Secondary | ICD-10-CM

## 2021-03-29 DIAGNOSIS — Z125 Encounter for screening for malignant neoplasm of prostate: Secondary | ICD-10-CM | POA: Diagnosis not present

## 2021-03-29 DIAGNOSIS — M0579 Rheumatoid arthritis with rheumatoid factor of multiple sites without organ or systems involvement: Secondary | ICD-10-CM

## 2021-03-29 DIAGNOSIS — E1169 Type 2 diabetes mellitus with other specified complication: Secondary | ICD-10-CM

## 2021-03-29 DIAGNOSIS — Z23 Encounter for immunization: Secondary | ICD-10-CM | POA: Diagnosis not present

## 2021-03-29 DIAGNOSIS — Z8679 Personal history of other diseases of the circulatory system: Secondary | ICD-10-CM

## 2021-03-29 LAB — POCT GLYCOSYLATED HEMOGLOBIN (HGB A1C)
HbA1c POC (<> result, manual entry): 6.8 % (ref 4.0–5.6)
HbA1c, POC (controlled diabetic range): 6.8 % (ref 0.0–7.0)
HbA1c, POC (prediabetic range): 6.8 % — AB (ref 5.7–6.4)
Hemoglobin A1C: 6.8 % — AB (ref 4.0–5.6)

## 2021-03-29 MED ORDER — TERBINAFINE HCL 250 MG PO TABS: 250.0000 mg | ORAL_TABLET | Freq: Every day | ORAL | 2 refills | Status: DC

## 2021-03-29 MED ORDER — METFORMIN HCL 500 MG PO TABS: ORAL_TABLET | ORAL | 1 refills | Status: DC

## 2021-03-29 MED ORDER — ROSUVASTATIN CALCIUM 5 MG PO TABS: 5.0000 mg | ORAL_TABLET | Freq: Every day | ORAL | 3 refills | Status: DC

## 2021-03-29 MED ORDER — OMEPRAZOLE 20 MG PO CPDR: 20.0000 mg | DELAYED_RELEASE_CAPSULE | Freq: Every day | ORAL | 3 refills | Status: DC

## 2021-03-29 NOTE — Progress Notes (Signed)
This visit occurred during the SARS-CoV-2 public health emergency.  Safety protocols were in place, including screening questions prior to the visit, additional usage of staff PPE, and extensive cleaning of exam room while observing appropriate contact time as indicated for disinfecting solutions.    Patient ID: Bruce Kelly, male  DOB: 1960/10/19, 60 y.o.   MRN: [id number] Patient Care Team    Relationship Specialty Notifications Start End  [name], DO PCP - General Family Medicine  11/21/16   [name], MD PCP - Electrophysiology Cardiology Admissions 12/24/17   [name]  11/22/16   [name], PA-C (Inactive)  Family Medicine  09/26/17   [name], MD Consulting Physician Cardiology  12/06/17     Chief Complaint  Patient presents with   Follow-up    3 week f/u    Subjective: Bruce Kelly is a 60 y.o. male present for [facility] History of atrial fibrillation s/p ablation/hyperlipidemia: Patient reports he is doing well s/p his ablation 02/2018.Marland Kitchen   He is not no longer in need of amiodarone.  He does report compliance with Crestor qod   Osteopenia/vitamin D deficiency:   DEXA   2020 with osteopenia.  He is supplementing with vit d.    GERD: Prilosec 20 mg daily controls symptoms - he needs to continue or symptoms resurface.  Symptoms are well controlled.   Rheumatoid arthritis: He reports overall his rheumatoid arthritis is stable.  He does feel its not as well controlled since switching to Humira from Simponi. established with rheumatology.  Currently prescribed humira, methotrexate, folate and Mobic by rheumatology.   Diabetes:   Patient reports compliance with metformin 1000/500 with meds.  Diagnosed with diabetes May 2018, prediabetic for some time. He has attended the nutrition classes. Patient denies dizziness, hyperglycemic or hypoglycemic events. Patient denies dizziness, hyperglycemic or hypoglycemic events. Patient  denies numbness, tingling in the extremities or nonhealing wounds of feet.  PNA series: Prevnar 1302/21/2019, Pneumovax completed 08/13/2018 Flu shot: Flu shot -completed today(recommneded yearly) Urine microalbumin: completed today Foot exam: Completed 03/29/2021 Eye exam: UTD 09/16/2020, [facility].   Depression screen [facility] 2/9 07/20/2020 02/04/2020 12/05/2017 04/15/2017 12/20/2016  Decreased Interest 0 0 0 0 0  Down, Depressed, Hopeless 0 0 0 0 0  PHQ - 2 Score 0 0 0 0 0   No flowsheet data found.   Past Medical History:  Diagnosis Date   Closed right ankle fracture    Colon polyp 2011   x3 polyps, pts reports 10 year follow up   Delayed union of metatarsal fracture, left 03/2014   No hardware or surgery   ED (erectile dysfunction) 09/23/2017   Elevated ferritin 2013   479 -->  return to normal   Encounter for long-term (current) use of other medications 09/23/2017   Erectile dysfunction 2013   Had been on Cialis   Generalized osteoarthritis    GERD (gastroesophageal reflux disease) 2013   Hyperlipidemia LDL goal <130    Irregular heart rate 09/23/2017   Neuropathy 09/23/2017   On statin therapy 04/15/2017   Osteopenia 2013   prior h/o of Chronic steroids for 10 years; was on Boniva for 6 months. Now takes calcium/vitamin D daily.   Osteopenia of multiple sites    Other and unspecified hyperlipidemia 09/23/2017   Overweight (BMI 25.0-29.9) 11/22/2016   Peripheral neuropathy 2009   Did not respond to gabapentin by prior records   Prediabetes  RA (rheumatoid arthritis) ([place]) 09/23/2017   Rheumatoid arthritis ([place]) 1998   had been on chronic steroids for about 10 years   Rheumatoid lung ([place])    pt reports having thoracentesis for rheumatoid lung many years ago.    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[place])    Thrombocytopenia ([place]) 2013   Mildly low at 130   Type 2 diabetes mellitus without complication, without long-term current use of insulin ([place]) 11/22/2016   Vitamin D deficiency  11/22/2016   Wears glasses    [facility]   Allergies  Allergen Reactions   Penicillins Other (See Comments)    Doesn't know   Sulfa Antibiotics Other (See Comments)    Doesn't know   Past Surgical History:  Procedure Laterality Date   ABLATION OF DYSRHYTHMIC FOCUS  02/07/2018   ATRIAL FIBRILLATION ABLATION N/A 02/07/2018   Procedure: ATRIAL FIBRILLATION ABLATION;  Surgeon: [name], MD;  Location: [place] [facility];  Service: Cardiovascular;  Laterality: N/A;   COLONOSCOPY     COLONOSCOPY     NO PAST SURGERIES     Family History  Problem Relation Age of Onset   Arthritis Mother    Diabetes Father    Heart disease Father    Arthritis Sister    Diabetes Sister    Alcohol abuse Brother    Drug abuse Brother    Breast cancer Paternal Aunt    Early death Maternal Grandfather    Colon cancer Neg Hx    Esophageal cancer Neg Hx    Stomach cancer Neg Hx    Rectal cancer Neg Hx    Social History   Social History Narrative   Drinks caffeine, takes a daily vitamin.   Masters degree, Materials engineer.   Exercises routinely.   Wears his seatbelt, firearms locked in the home.   Feels  safe in his relationships.    Allergies as of 03/29/2021       Reactions   Penicillins Other (See Comments)   Doesn't know   Sulfa Antibiotics Other (See Comments)   Doesn't know        Medication List        Accurate as of March 29, 2021 11:59 PM. If you have any questions, ask your nurse or doctor.          STOP taking these medications    clotrimazole 1 % cream Commonly known as: Clotrimazole Anti-Fungal Stopped by: Howard Pouch, DO   doxycycline 100 MG tablet Commonly known as: VIBRA-TABS Stopped by: Howard Pouch, DO   mupirocin cream 2 % Commonly known as: Bactroban Stopped by: Howard Pouch, DO       TAKE these medications    aspirin 81 MG chewable tablet Chew by mouth daily.   b complex vitamins capsule Take 1 capsule by mouth daily.    calcium-vitamin D 500-200 MG-UNIT tablet Commonly known as: OSCAL WITH D Take 1 tablet by mouth daily with breakfast.   folic acid 1 MG tablet Commonly known as: FOLVITE Take 1 mg by mouth daily.   Humira Pen 40 MG/0.4ML Pnkt Generic drug: Adalimumab SMARTSIG:40 Milligram(s) SUB-Q Every 2 Weeks   meloxicam 15 MG tablet Commonly known as: MOBIC Take 15 mg by mouth daily.   metFORMIN 500 MG tablet Commonly known as: GLUCOPHAGE TAKE 2 TABLETS EVERY MORNING AND TAKE 1 TABLET IN THE AFTERNOON. What changed: additional instructions Changed by: Howard Pouch, DO   methotrexate 2.5 MG tablet Commonly known as: RHEUMATREX Take 4 tablets by mouth once  a week.   multivitamin capsule Take 1 capsule by mouth daily. Takes womens for extra calcium   omeprazole 20 MG capsule Commonly known as: PRILOSEC Take 1 capsule (20 mg total) by mouth daily.   rosuvastatin 5 MG tablet Commonly known as: CRESTOR Take 1 tablet (5 mg total) by mouth daily. What changed: See the new instructions. Changed by: Howard Pouch, DO   terbinafine 250 MG tablet Commonly known as: LAMISIL Take 1 tablet (250 mg total) by mouth daily. Started by: Howard Pouch, DO       All past medical history, surgical history, allergies, family history, immunizations andmedications were updated in the EMR today and reviewed under the history and medication portions of their EMR.     Recent Results (from the past 2160 hour(s))  POCT glycosylated hemoglobin (Hb A1C)     Status: Abnormal   Collection Time: 03/29/21  3:38 PM  Result Value Ref Range   Hemoglobin A1C 6.8 (A) 4.0 - 5.6 %   HbA1c POC (<> result, manual entry) 6.8 4.0 - 5.6 %   HbA1c, POC (prediabetic range) 6.8 (A) 5.7 - 6.4 %   HbA1c, POC (controlled diabetic range) 6.8 0.0 - 7.0 %  Urine Microalbumin w/creat. ratio     Status: None   Collection Time: 03/29/21  4:11 PM  Result Value Ref Range   Creatinine, Urine 94 20 - 320 mg/dL   Microalb, Ur 0.2  mg/dL    Comment: Reference Range Not established    Microalb Creat Ratio 2 <30 mcg/mg creat    Comment: . The ADA defines abnormalities in albumin excretion as follows: Marland Kitchen Albuminuria Category        Result (mcg/mg creatinine) . Normal to Mildly increased   <30 Moderately increased         30-299  Severely increased           > OR = 300 . The ADA recommends that at least two of three specimens collected within a 3-6 month period be abnormal before considering a patient to be within a diagnostic category.   CBC w/Diff     Status: Abnormal   Collection Time: 03/29/21  4:11 PM  Result Value Ref Range   WBC 6.8 3.8 - 10.8 Thousand/uL   RBC 4.88 4.20 - 5.80 Million/uL   Hemoglobin 15.5 13.2 - 17.1 g/dL   HCT 45.5 38.5 - 50.0 %   MCV 93.2 80.0 - 100.0 fL   MCH 31.8 27.0 - 33.0 pg   MCHC 34.1 32.0 - 36.0 g/dL   RDW 13.1 11.0 - 15.0 %   Platelets 146 140 - 400 Thousand/uL   MPV 12.8 (H) 7.5 - 12.5 fL   Neutro Abs 3,964 1,500 - 7,800 cells/uL   Lymphs Abs 1,931 850 - 3,900 cells/uL   Absolute Monocytes 592 200 - 950 cells/uL   Eosinophils Absolute 272 15 - 500 cells/uL   Basophils Absolute 41 0 - 200 cells/uL   Neutrophils Relative % 58.3 %   Total Lymphocyte 28.4 %   Monocytes Relative 8.7 %   Eosinophils Relative 4.0 %   Basophils Relative 0.6 %  TSH     Status: None   Collection Time: 03/29/21  4:11 PM  Result Value Ref Range   TSH 1.35 0.40 - 4.50 mIU/L  Comp Met (CMET)     Status: Abnormal   Collection Time: 03/29/21  4:11 PM  Result Value Ref Range   Glucose, Bld 116 (H) 65 - 99 mg/dL  Comment: .            Fasting reference interval . For someone without known diabetes, a glucose value between 100 and 125 mg/dL is consistent with prediabetes and should be confirmed with a follow-up test. .    BUN 19 7 - 25 mg/dL   Creat 0.87 0.70 - 1.35 mg/dL   BUN/Creatinine Ratio NOT APPLICABLE 6 - 22 (calc)   Sodium 142 135 - 146 mmol/L   Potassium 4.0 3.5 - 5.3  mmol/L   Chloride 104 98 - 110 mmol/L   CO2 24 20 - 32 mmol/L   Calcium 9.5 8.6 - 10.3 mg/dL   Total Protein 7.3 6.1 - 8.1 g/dL   Albumin 4.2 3.6 - 5.1 g/dL   Globulin 3.1 1.9 - 3.7 g/dL (calc)   AG Ratio 1.4 1.0 - 2.5 (calc)   Total Bilirubin 0.7 0.2 - 1.2 mg/dL   Alkaline phosphatase (APISO) 65 35 - 144 U/L   AST 26 10 - 35 U/L   ALT 29 9 - 46 U/L  Lipid panel     Status: Abnormal   Collection Time: 03/29/21  4:11 PM  Result Value Ref Range   Cholesterol 165 <200 mg/dL   HDL 48 > OR = 40 mg/dL   Triglycerides 169 (H) <150 mg/dL   LDL Cholesterol (Calc) 90 mg/dL (calc)    Comment: Reference range: <100 . Desirable range <100 mg/dL for primary prevention;   <70 mg/dL for patients with CHD or diabetic patients  with > or = 2 CHD risk factors. Marland Kitchen LDL-C is now calculated using the Martin-Hopkins  calculation, which is a validated novel method providing  better accuracy than the Friedewald equation in the  estimation of LDL-C.  Cresenciano Genre et al. Annamaria Helling. 4503;888(28): 2061-2068  (http://education.[employer].com/faq/FAQ164)    Total CHOL/HDL Ratio 3.4 <5.0 (calc)   Non-HDL Cholesterol (Calc) 117 <130 mg/dL (calc)    Comment: For patients with diabetes plus 1 major ASCVD risk  factor, treating to a non-HDL-C goal of <100 mg/dL  (LDL-C of <70 mg/dL) is considered a therapeutic  option.   PSA     Status: None   Collection Time: 03/29/21  4:11 PM  Result Value Ref Range   PSA 0.49 < OR = 4.00 ng/mL    Comment: The total PSA value from this assay system is  standardized against the WHO standard. The test  result will be approximately 20% lower when compared  to the equimolar-standardized total PSA (Beckman  Coulter). Comparison of serial PSA results should be  interpreted with this fact in mind. . This test was performed using the Siemens  chemiluminescent method. Values obtained from  different assay methods cannot be used interchangeably. PSA levels, regardless  of value, should not be interpreted as absolute evidence of the presence or absence of disease.      ROS: 14 pt review of systems performed and negative (unless mentioned in an HPI)  Objective: BP 122/70   Pulse (!) 58   Temp 97.6 F (36.4 C)   Wt 204 lb 12.8 oz (92.9 kg)   SpO2 98%   BMI 27.78 kg/m  Gen: Afebrile. No acute distress.  Nontoxic, very pleasant male. HENT: AT. [place].  No cough no hoarseness. Eyes:Pupils Equal Round Reactive to light, Extraocular movements intact,  Conjunctiva without redness, discharge or icterus. Neck/lymp/endocrine: Supple, no lymphadenopathy, no thyromegaly CV: RRR no murmur, no edema, +2/4 P posterior tibialis pulses Chest: CTAB, no wheeze or crackles Neuro:  Normal  gait. PERLA. EOMi. Alert. Oriented x3 Psych: Normal affect, dress and demeanor. Normal speech. Normal thought content and judgment..  Diabetic Foot Exam - Simple   Simple Foot Form  03/29/2021  5:35 PM  Visual Inspection See comments: Yes Sensation Testing Intact to touch and monofilament testing bilaterally: Yes Pulse Check Posterior Tibialis and Dorsalis pulse intact bilaterally: Yes Comments Well-healing small ulceration between fourth and fifth digits of left foot.  Flakiness of bilateral feet has improved, toenail thickening and prognosis as well as interdigital tinea remains.     No results found.  Assessment/plan: Bruce Kelly is a 60 y.o. male present for CPE Type 2 diabetes mellitus with hyperlipidemia -Stable A1c 5.8--> 6.4--> 6.1--> 6.1>>6.2>7.2>  6.8 >6.8 A1c today.  -Continue with metformin 1000/500 mg twice a day. -Continue statin-Crestor 5 mg every other day PNA series: Prevnar 1302/21/2019, Pneumovax completed 08/13/2018 Flu shot: Flu shot (recommneded yearly) Urine microalbumin: Collected today Foot exam: Completed today 03/29/2021 Eye exam: UTD 09/07/2019, [facility] - f/u 5.5 mos  Hyperlipidemia LDL goal <130/On statin therapy/Overweight (BMI  25.0-29.9)/Thrombocytopenia (HCC)/Status post ablation of atrial fibrillation -Stable resolution of atrial fibrillation after ablation. -CBC, CMP, TSH and lipids collected today -Continue crestor 5 mg QOD.   Gastroesophageal reflux disease, esophagitis presence not specified Stable Continue omeprazole 20 mg    Rheumatoid arthritis with positive rheumatoid factor, involving unspecified site (HCC)/ High risk medication use -Continue follow-up with rheumatology he is on a regimen of methotrexate, Humira folate and Mobic through his rheumatologist.  Vitamin D deficiency/osteopenia Supplementing dexa due 2025 (rheum) Stable - labs   Tinea pedis: Terbinafine 250 mg daily x12 weeks  Influenza vaccine administered - Flu Vaccine QUAD 37moIM (Fluarix, Fluzone & Alfiuria Quad PF) Prostate cancer screening - PSA  Return in about 5 months (around 09/12/2021) for CPE (30 min), [facility] (30 min).  Orders Placed This Encounter  Procedures   Flu Vaccine QUAD 655moM (Fluarix, Fluzone & Alfiuria Quad PF)   Urine Microalbumin w/creat. ratio   CBC w/Diff   TSH   Comp Met (CMET)   Lipid panel   PSA   POCT glycosylated hemoglobin (Hb A1C)    Meds ordered this encounter  Medications   DISCONTD: metFORMIN (GLUCOPHAGE) 500 MG tablet    Sig: TAKE 2 TABLETS EVERY MORNING AND TAKE 1 TABLET IN THE AFTERNOON.    Dispense:  270 tablet    Refill:  1   DISCONTD: omeprazole (PRILOSEC) 20 MG capsule    Sig: Take 1 capsule (20 mg total) by mouth daily.    Dispense:  90 capsule    Refill:  3   DISCONTD: rosuvastatin (CRESTOR) 5 MG tablet    Sig: Take 1 tablet (5 mg total) by mouth daily.    Dispense:  90 tablet    Refill:  3   terbinafine (LAMISIL) 250 MG tablet    Sig: Take 1 tablet (250 mg total) by mouth daily.    Dispense:  30 tablet    Refill:  2    Referral Orders  No referral(s) requested today    Preventive exam completed today with and additional > 20 Minutes was dedicated to this  patient's encounter to include pre-visit review of chart, face-to-face time with patient and post-visit work- which include documentation and prescribing medications and/or ordering test when necessary for more than one chronic medical condition.    Note is dictated utilizing voice recognition software. Although note has been proof read prior to signing, occasional typographical errors still can  be missed. If any questions arise, please do not hesitate to call for verification.  Electronically signed by: Howard Pouch, DO [place]

## 2021-03-29 NOTE — Patient Instructions (Signed)
Great to see you today.  I have refilled the medication(s) we provide.   If labs were collected, we will inform you of lab results once received either by echart message or telephone call.   - echart message- for normal results that have been seen by the patient already.   - telephone call: abnormal results or if patient has not viewed results in their echart.  Diabetes Mellitus and Foot Care Foot care is an important part of your health, especially when you have diabetes. Diabetes may cause you to have problems because of poor blood flow (circulation) to your feet and legs, which can cause your skin to: Become thinner and drier. Break more easily. Heal more slowly. Peel and crack. You may also have nerve damage (neuropathy) in your legs and feet, causing decreased feeling in them. This means that you may not notice minor injuries to your feet that could lead to more serious problems. Noticing and addressing any potential problems early is the best way to prevent future foot problems. How to care for your feet Foot hygiene  Wash your feet daily with warm water and mild soap. Do not use hot water. Then, pat your feet and the areas between your toes until they are completely dry. Do not soak your feet as this can dry your skin. Trim your toenails straight across. Do not dig under them or around the cuticle. File the edges of your nails with an emery board or nail file. Apply a moisturizing lotion or petroleum jelly to the skin on your feet and to dry, brittle toenails. Use lotion that does not contain alcohol and is unscented. Do not apply lotion between your toes. Shoes and socks Wear clean socks or stockings every day. Make sure they are not too tight. Do not wear knee-high stockings since they may decrease blood flow to your legs. Wear shoes that fit properly and have enough cushioning. Always look in your shoes before you put them on to be sure there are no objects inside. To break in new  shoes, wear them for just a few hours a day. This prevents injuries on your feet. Wounds, scrapes, corns, and calluses  Check your feet daily for blisters, cuts, bruises, sores, and redness. If you cannot see the bottom of your feet, use a mirror or ask someone for help. Do not cut corns or calluses or try to remove them with medicine. If you find a minor scrape, cut, or break in the skin on your feet, keep it and the skin around it clean and dry. You may clean these areas with mild soap and water. Do not clean the area with peroxide, alcohol, or iodine. If you have a wound, scrape, corn, or callus on your foot, look at it several times a day to make sure it is healing and not infected. Check for: Redness, swelling, or pain. Fluid or blood. Warmth. Pus or a bad smell. General tips Do not cross your legs. This may decrease blood flow to your feet. Do not use heating pads or hot water bottles on your feet. They may burn your skin. If you have lost feeling in your feet or legs, you may not know this is happening until it is too late. Protect your feet from hot and cold by wearing shoes, such as at the beach or on hot pavement. Schedule a complete foot exam at least once a year (annually) or more often if you have foot problems. Report any cuts, sores, or  bruises to your health care provider immediately. Where to find more information American Diabetes Association: www.diabetes.org Association of Diabetes Care & Education Specialists: www.diabeteseducator.org Contact a health care provider if: You have a medical condition that increases your risk of infection and you have any cuts, sores, or bruises on your feet. You have an injury that is not healing. You have redness on your legs or feet. You feel burning or tingling in your legs or feet. You have pain or cramps in your legs and feet. Your legs or feet are numb. Your feet always feel cold. You have pain around any toenails. Get help right  away if: You have a wound, scrape, corn, or callus on your foot and: You have pain, swelling, or redness that gets worse. You have fluid or blood coming from the wound, scrape, corn, or callus. Your wound, scrape, corn, or callus feels warm to the touch. You have pus or a bad smell coming from the wound, scrape, corn, or callus. You have a fever. You have a red line going up your leg. Summary Check your feet every day for blisters, cuts, bruises, sores, and redness. Apply a moisturizing lotion or petroleum jelly to the skin on your feet and to dry, brittle toenails. Wear shoes that fit properly and have enough cushioning. If you have foot problems, report any cuts, sores, or bruises to your health care provider immediately. Schedule a complete foot exam at least once a year (annually) or more often if you have foot problems. This information is not intended to replace advice given to you by your health care provider. Make sure you discuss any questions you have with your health care provider. Document Revised: 01/14/2020 Document Reviewed: 01/14/2020 Elsevier Patient Education  2022 [employer].   If you are age 29 or older, your body mass index should be between 23-30. Your Body mass index is Body mass index is 27.78 kg/m.Marland Kitchen If this is above the aforementioned range listed, you are consider overweight and if BMI > 35 you are conisdered obese, by medical definition and standards. Routine daily exercise and dietary modifications are encouraged. If you would like additional guidance on weight loss, please make an appointment for weight loss counseling - must be an appointment dedicate to weight loss counseling alone. I would be happy to help you.   If you are age 44 or younger, your body mass index should be between 19-25. Your Body mass index is Body mass index is 27.78 kg/m. Marland Kitchen If this is above the aformentioned range listed, you are consider overwieght and obese if BMI > 30, by medical  definition and standards. Routine daily exercise and dietary modifications are encouraged. If you would like additional guidance on weight loss, please make an appointment for weight loss counseling - must be an appointment dedicate to weight loss counseling alone. I would be happy to help you.

## 2021-03-30 ENCOUNTER — Other Ambulatory Visit: Payer: Self-pay

## 2021-03-30 LAB — COMPREHENSIVE METABOLIC PANEL
AG Ratio: 1.4 (calc) (ref 1.0–2.5)
ALT: 29 U/L (ref 9–46)
AST: 26 U/L (ref 10–35)
Albumin: 4.2 g/dL (ref 3.6–5.1)
Alkaline phosphatase (APISO): 65 U/L (ref 35–144)
BUN: 19 mg/dL (ref 7–25)
CO2: 24 mmol/L (ref 20–32)
Calcium: 9.5 mg/dL (ref 8.6–10.3)
Chloride: 104 mmol/L (ref 98–110)
Creat: 0.87 mg/dL (ref 0.70–1.35)
Globulin: 3.1 g/dL (calc) (ref 1.9–3.7)
Glucose, Bld: 116 mg/dL — ABNORMAL HIGH (ref 65–99)
Potassium: 4 mmol/L (ref 3.5–5.3)
Sodium: 142 mmol/L (ref 135–146)
Total Bilirubin: 0.7 mg/dL (ref 0.2–1.2)
Total Protein: 7.3 g/dL (ref 6.1–8.1)

## 2021-03-30 LAB — MICROALBUMIN / CREATININE URINE RATIO
Creatinine, Urine: 94 mg/dL (ref 20–320)
Microalb Creat Ratio: 2 mcg/mg creat (ref <30)
Microalb, Ur: 0.2 mg/dL

## 2021-03-30 LAB — CBC WITH DIFFERENTIAL/PLATELET
Absolute Monocytes: 592 cells/uL (ref 200–950)
Basophils Absolute: 41 cells/uL (ref 0–200)
Basophils Relative: 0.6 %
Eosinophils Absolute: 272 cells/uL (ref 15–500)
Eosinophils Relative: 4 %
HCT: 45.5 % (ref 38.5–50.0)
Hemoglobin: 15.5 g/dL (ref 13.2–17.1)
Lymphs Abs: 1931 cells/uL (ref 850–3900)
MCH: 31.8 pg (ref 27.0–33.0)
MCHC: 34.1 g/dL (ref 32.0–36.0)
MCV: 93.2 fL (ref 80.0–100.0)
MPV: 12.8 fL — ABNORMAL HIGH (ref 7.5–12.5)
Monocytes Relative: 8.7 %
Neutro Abs: 3964 cells/uL (ref 1500–7800)
Neutrophils Relative %: 58.3 %
Platelets: 146 10*3/uL (ref 140–400)
RBC: 4.88 10*6/uL (ref 4.20–5.80)
RDW: 13.1 % (ref 11.0–15.0)
Total Lymphocyte: 28.4 %
WBC: 6.8 10*3/uL (ref 3.8–10.8)

## 2021-03-30 LAB — LIPID PANEL
Cholesterol: 165 mg/dL (ref <200)
HDL: 48 mg/dL (ref >=40)
LDL Cholesterol (Calc): 90 mg/dL (calc)
Non-HDL Cholesterol (Calc): 117 mg/dL (calc) (ref <130)
Total CHOL/HDL Ratio: 3.4 (calc) (ref <5.0)
Triglycerides: 169 mg/dL — ABNORMAL HIGH (ref <150)

## 2021-03-30 LAB — TSH: TSH: 1.35 mIU/L (ref 0.40–4.50)

## 2021-03-30 LAB — PSA: PSA: 0.49 ng/mL (ref <=4.00)

## 2021-03-30 MED ORDER — METFORMIN HCL 500 MG PO TABS: ORAL_TABLET | ORAL | 1 refills | Status: DC

## 2021-03-30 MED ORDER — OMEPRAZOLE 20 MG PO CPDR: 20.0000 mg | DELAYED_RELEASE_CAPSULE | Freq: Every day | ORAL | 3 refills | Status: DC

## 2021-03-30 MED ORDER — ROSUVASTATIN CALCIUM 5 MG PO TABS: 5.0000 mg | ORAL_TABLET | Freq: Every day | ORAL | 3 refills | Status: DC

## 2021-03-31 ENCOUNTER — Other Ambulatory Visit: Payer: Self-pay | Admitting: Family Medicine

## 2021-03-31 DIAGNOSIS — B353 Tinea pedis: Secondary | ICD-10-CM | POA: Insufficient documentation

## 2021-04-03 DIAGNOSIS — Z79899 Other long term (current) drug therapy: Secondary | ICD-10-CM | POA: Diagnosis not present

## 2021-04-03 DIAGNOSIS — M2550 Pain in unspecified joint: Secondary | ICD-10-CM | POA: Diagnosis not present

## 2021-04-03 DIAGNOSIS — M150 Primary generalized (osteo)arthritis: Secondary | ICD-10-CM | POA: Diagnosis not present

## 2021-04-03 DIAGNOSIS — M0579 Rheumatoid arthritis with rheumatoid factor of multiple sites without organ or systems involvement: Secondary | ICD-10-CM | POA: Diagnosis not present

## 2021-05-15 DIAGNOSIS — Z79899 Other long term (current) drug therapy: Secondary | ICD-10-CM | POA: Diagnosis not present

## 2021-05-15 DIAGNOSIS — M0579 Rheumatoid arthritis with rheumatoid factor of multiple sites without organ or systems involvement: Secondary | ICD-10-CM | POA: Diagnosis not present

## 2021-07-20 DIAGNOSIS — M25561 Pain in right knee: Secondary | ICD-10-CM | POA: Diagnosis not present

## 2021-07-20 DIAGNOSIS — M2550 Pain in unspecified joint: Secondary | ICD-10-CM | POA: Diagnosis not present

## 2021-07-20 DIAGNOSIS — M0579 Rheumatoid arthritis with rheumatoid factor of multiple sites without organ or systems involvement: Secondary | ICD-10-CM | POA: Diagnosis not present

## 2021-07-20 DIAGNOSIS — M25562 Pain in left knee: Secondary | ICD-10-CM | POA: Diagnosis not present

## 2021-07-20 DIAGNOSIS — M150 Primary generalized (osteo)arthritis: Secondary | ICD-10-CM | POA: Diagnosis not present

## 2021-07-20 DIAGNOSIS — M0630 Rheumatoid nodule, unspecified site: Secondary | ICD-10-CM | POA: Diagnosis not present

## 2021-07-20 DIAGNOSIS — M79672 Pain in left foot: Secondary | ICD-10-CM | POA: Diagnosis not present

## 2021-07-20 DIAGNOSIS — M79671 Pain in right foot: Secondary | ICD-10-CM | POA: Diagnosis not present

## 2021-08-01 DIAGNOSIS — M7741 Metatarsalgia, right foot: Secondary | ICD-10-CM | POA: Diagnosis not present

## 2021-08-01 DIAGNOSIS — M2042 Other hammer toe(s) (acquired), left foot: Secondary | ICD-10-CM | POA: Diagnosis not present

## 2021-08-01 DIAGNOSIS — Q667 Congenital pes cavus, unspecified foot: Secondary | ICD-10-CM | POA: Diagnosis not present

## 2021-09-14 ENCOUNTER — Ambulatory Visit (INDEPENDENT_AMBULATORY_CARE_PROVIDER_SITE_OTHER): Payer: BC Managed Care – PPO | Admitting: Cardiology

## 2021-09-14 ENCOUNTER — Encounter: Payer: Self-pay | Admitting: Cardiology

## 2021-09-14 ENCOUNTER — Other Ambulatory Visit: Payer: Self-pay

## 2021-09-14 VITALS — BP 118/78 | HR 62 | Ht 72.0 in | Wt 206.0 lb

## 2021-09-14 DIAGNOSIS — I480 Paroxysmal atrial fibrillation: Secondary | ICD-10-CM

## 2021-09-14 NOTE — Progress Notes (Signed)
Electrophysiology Office Note   Date:  09/14/2021   ID:  Bruce Kelly, DOB [birth year], MRN [id number]  PCP:  [name], DO  Cardiologist:  Bettina Gavia Primary Electrophysiologist:  [name], MD    No chief complaint on file.    History of Present Illness: Bruce Kelly is a 61 y.o. male who is being seen today for the evaluation of atrial fibrillation at the request of Shirlee More. Presenting today for electrophysiology evaluation.    He has a history significant for hyperlipidemia, atrial fibrillation, and diabetes.  He was having breakthrough episodes of atrial fibrillation on flecainide.  He is now status post ablation 02/07/2018.  Today, denies symptoms of palpitations, chest pain, shortness of breath, orthopnea, PND, lower extremity edema, claudication, dizziness, presyncope, syncope, bleeding, or neurologic sequela. The patient is tolerating medications without difficulties.  He is overall doing well.  He has had no further episodes of atrial fibrillation.  He is overall happy with his control.  He is retiring soon to see all of his physicians to make sure that he is not having any issues that need to be addressed.   Past Medical History:  Diagnosis Date   Closed right ankle fracture    Colon polyp 2011   x3 polyps, pts reports 10 year follow up   Delayed union of metatarsal fracture, left 03/2014   No hardware or surgery   ED (erectile dysfunction) 09/23/2017   Elevated ferritin 2013   479 -->  return to normal   Encounter for long-term (current) use of other medications 09/23/2017   Erectile dysfunction 2013   Had been on Cialis   Generalized osteoarthritis    GERD (gastroesophageal reflux disease) 2013   Hyperlipidemia LDL goal <130    Irregular heart rate 09/23/2017   Neuropathy 09/23/2017   On statin therapy 04/15/2017   Osteopenia 2013   prior h/o of Chronic steroids for 10 years; was on Boniva for 6 months. Now takes calcium/vitamin D daily.    Osteopenia of multiple sites    Other and unspecified hyperlipidemia 09/23/2017   Overweight (BMI 25.0-29.9) 11/22/2016   Peripheral neuropathy 2009   Did not respond to gabapentin by prior records   Prediabetes    RA (rheumatoid arthritis) ([place]) 09/23/2017   Rheumatoid arthritis ([place]) 1998   had been on chronic steroids for about 10 years   Rheumatoid lung ([place])    pt reports having thoracentesis for rheumatoid lung many years ago.    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[place])    Thrombocytopenia ([place]) 2013   Mildly low at 130   Type 2 diabetes mellitus without complication, without long-term current use of insulin ([place]) 11/22/2016   Vitamin D deficiency 11/22/2016   Wears glasses    [facility]   Past Surgical History:  Procedure Laterality Date   ABLATION OF DYSRHYTHMIC FOCUS  02/07/2018   ATRIAL FIBRILLATION ABLATION N/A 02/07/2018   Procedure: ATRIAL FIBRILLATION ABLATION;  Surgeon: [name], MD;  Location: [facility];  Service: Cardiovascular;  Laterality: N/A;   COLONOSCOPY     COLONOSCOPY     NO PAST SURGERIES       Current Outpatient Medications  Medication Sig Dispense Refill   aspirin 81 MG chewable tablet Chew 81 mg by mouth daily.     b complex vitamins capsule Take 1 capsule by mouth daily.     calcium-vitamin D (OSCAL WITH D) 500-200 MG-UNIT tablet Take 1 tablet by mouth daily with breakfast.  folic acid (FOLVITE) 1 MG tablet Take 1 mg by mouth daily.      HUMIRA PEN 40 MG/0.4ML PNKT SMARTSIG:40 Milligram(s) SUB-Q Every 2 Weeks     meloxicam (MOBIC) 15 MG tablet Take 15 mg by mouth daily.     metFORMIN (GLUCOPHAGE) 500 MG tablet TAKE 2 TABLETS EVERY MORNING AND TAKE 1 TABLET IN THE AFTERNOON. 270 tablet 1   methotrexate (RHEUMATREX) 2.5 MG tablet Take 8 tablets by mouth once a week.     Multiple Vitamin (MULTIVITAMIN) capsule Take 1 capsule by mouth daily. Takes womens for extra calcium     omeprazole (PRILOSEC) 20 MG capsule Take 1 capsule (20 mg total)  by mouth daily. 90 capsule 3   rosuvastatin (CRESTOR) 5 MG tablet Take 1 tablet (5 mg total) by mouth daily. 90 tablet 3   terbinafine (LAMISIL) 250 MG tablet Take 1 tablet (250 mg total) by mouth daily. (Patient not taking: Reported on 09/14/2021) 30 tablet 2   No current facility-administered medications for this visit.    Allergies:   Penicillins and Sulfa antibiotics   Social History:  The patient  reports that he quit smoking about 24 years ago. His smoking use included cigarettes. He has a 15.00 pack-year smoking history. He has never used smokeless tobacco. He reports current alcohol use of about 4.0 standard drinks per week. He reports that he does not use drugs.   Family History:  The patient's family history includes Alcohol abuse in his brother; Arthritis in his mother and sister; Breast cancer in his paternal aunt; Diabetes in his father and sister; Drug abuse in his brother; Early death in his maternal grandfather; Heart disease in his father.   ROS:  Please see the history of present illness.   Otherwise, review of systems is positive for none.   All other systems are reviewed and negative.   PHYSICAL EXAM: VS:  BP 118/78    Pulse 62    Ht 6' (1.829 m)    Wt 206 lb (93.4 kg)    SpO2 97%    BMI 27.94 kg/m  , BMI Body mass index is 27.94 kg/m. GEN: Well nourished, well developed, in no acute distress  HEENT: normal  Neck: no JVD, carotid bruits, or masses Cardiac: RRR; no murmurs, rubs, or gallops,no edema  Respiratory:  clear to auscultation bilaterally, normal work of breathing GI: soft, nontender, nondistended, + BS MS: no deformity or atrophy  Skin: warm and dry Neuro:  Strength and sensation are intact Psych: euthymic mood, full affect  EKG:  EKG is ordered today. Personal review of the ekg ordered shows sinus rhythm, rate 62  Recent Labs: 03/29/2021: ALT 29; BUN 19; Creat 0.87; Hemoglobin 15.5; Platelets 146; Potassium 4.0; Sodium 142; TSH 1.35    Lipid Panel      Component Value Date/Time   CHOL 165 03/29/2021 1611   TRIG 169 (H) 03/29/2021 1611   HDL 48 03/29/2021 1611   CHOLHDL 3.4 03/29/2021 1611   VLDL 32.8 02/04/2020 0933   LDLCALC 90 03/29/2021 1611     Wt Readings from Last 3 Encounters:  09/14/21 206 lb (93.4 kg)  03/29/21 204 lb 12.8 oz (92.9 kg)  02/06/21 207 lb (93.9 kg)      Other studies Reviewed: Additional studies/ records that were reviewed today include: TTE 10/25/17  Review of the above records today demonstrates:  - Left ventricle: The cavity size was normal. Wall thickness was   normal. Systolic function was normal. The estimated  ejection   fraction was in the range of 55% to 60%. Wall motion was normal;   there were no regional wall motion abnormalities. Features are   consistent with a pseudonormal left ventricular filling pattern,   with concomitant abnormal relaxation and increased filling   pressure (grade 2 diastolic dysfunction). - Ascending aorta: The ascending aorta was moderately dilated. - Mitral valve: There was mild regurgitation. - Left atrium: The atrium was mildly dilated. - Right ventricle: The cavity size was moderately dilated. Wall   thickness was normal. - Right atrium: The atrium was moderately dilated.   ASSESSMENT AND PLAN:  1.  Paroxysmal atrial fibrillation: CHA2DS2-VASc of 1.  Status post ablation 02/07/2018.  Since his ablation he has done well without further episodes of atrial fibrillation.  He is overall happy with his control.  No changes.  2.  Hyperlipidemia: Continue Crestor per primary care.  Goal LDL less than 70.  He does have coronary artery calcium.  He would like this to be followed by his primary physician.  He Jaylanie Boschee take his Crestor on a daily basis until he sees her.   Current medicines are reviewed at length with the patient today.   The patient does not have concerns regarding his medicines.  The following changes were made today: none  Labs/ tests ordered today  include:  Orders Placed This Encounter  Procedures   EKG 12-Lead    Disposition:   FU with Arah Aro PRN months  Signed, Gor Vestal Meredith Leeds, MD  09/14/2021 9:40 AM     [facility] [address] [place] [address] [phone] (office) [phone] (fax)

## 2021-10-10 ENCOUNTER — Other Ambulatory Visit: Payer: Self-pay | Admitting: Family Medicine

## 2021-10-16 ENCOUNTER — Other Ambulatory Visit: Payer: Self-pay | Admitting: Family Medicine

## 2021-10-19 DIAGNOSIS — Z79899 Other long term (current) drug therapy: Secondary | ICD-10-CM | POA: Diagnosis not present

## 2021-10-19 DIAGNOSIS — M1991 Primary osteoarthritis, unspecified site: Secondary | ICD-10-CM | POA: Diagnosis not present

## 2021-10-19 DIAGNOSIS — M0579 Rheumatoid arthritis with rheumatoid factor of multiple sites without organ or systems involvement: Secondary | ICD-10-CM | POA: Diagnosis not present

## 2021-10-19 DIAGNOSIS — R5383 Other fatigue: Secondary | ICD-10-CM | POA: Diagnosis not present

## 2021-11-29 DIAGNOSIS — M0579 Rheumatoid arthritis with rheumatoid factor of multiple sites without organ or systems involvement: Secondary | ICD-10-CM | POA: Diagnosis not present

## 2021-11-29 DIAGNOSIS — Z79899 Other long term (current) drug therapy: Secondary | ICD-10-CM | POA: Diagnosis not present

## 2022-03-08 DIAGNOSIS — Z79899 Other long term (current) drug therapy: Secondary | ICD-10-CM | POA: Diagnosis not present

## 2022-03-08 DIAGNOSIS — M0579 Rheumatoid arthritis with rheumatoid factor of multiple sites without organ or systems involvement: Secondary | ICD-10-CM | POA: Diagnosis not present

## 2022-03-08 DIAGNOSIS — M1991 Primary osteoarthritis, unspecified site: Secondary | ICD-10-CM | POA: Diagnosis not present

## 2022-03-08 DIAGNOSIS — R5383 Other fatigue: Secondary | ICD-10-CM | POA: Diagnosis not present

## 2022-03-08 LAB — COMPREHENSIVE METABOLIC PANEL: eGFR: 98

## 2022-03-21 ENCOUNTER — Ambulatory Visit (INDEPENDENT_AMBULATORY_CARE_PROVIDER_SITE_OTHER): Payer: BC Managed Care – PPO | Admitting: Family Medicine

## 2022-03-21 ENCOUNTER — Encounter: Payer: Self-pay | Admitting: Family Medicine

## 2022-03-21 ENCOUNTER — Other Ambulatory Visit: Payer: Self-pay | Admitting: Family Medicine

## 2022-03-21 VITALS — BP 115/68 | HR 97 | Temp 97.5°F | Ht 72.0 in | Wt 199.0 lb

## 2022-03-21 DIAGNOSIS — S8992XA Unspecified injury of left lower leg, initial encounter: Secondary | ICD-10-CM | POA: Diagnosis not present

## 2022-03-21 MED ORDER — DOXYCYCLINE HYCLATE 100 MG PO TABS: 100.0000 mg | ORAL_TABLET | Freq: Two times a day (BID) | ORAL | 0 refills | Status: DC

## 2022-03-21 NOTE — Progress Notes (Signed)
Bruce Kelly , [birth year], 61 y.o., male MRN: [id number] Patient Care Team    Relationship Specialty Notifications Start End  Bruce Hillock, DO PCP - General Family Medicine  11/21/16   Bruce Haw, MD PCP - Electrophysiology Cardiology Admissions 12/24/17   [facility]  11/22/16   Bruce Kea, PA-C (Inactive)  Family Medicine  09/26/17   Bruce Priest, MD Consulting Physician Cardiology  12/06/17     Chief Complaint  Patient presents with   Leg Injury    Pt c/o L leg shin injury with a 6 x 6 wood post x 1 week     Subjective: Pt presents for an OV with complaints of injury 3 to his left lower leg approximately 1 week ago.  Patient reports he was sawing through a large piece of wood, when it fell off the sawhorse scraped down the front of his lower extremity.  He has been caring for the wound by keeping it clean and using peroxide daily.  Dressing it with triple antibiotic ointment and keeping it covered.  Last day or so he has elected to keep it uncovered.  He he has noticed redness forming around the edges of the wound and he is concerned of infection.  Patient is a diabetic.     03/21/2022   10:43 AM 07/20/2020    9:08 AM 02/04/2020    8:57 AM 12/05/2017    8:27 AM 04/15/2017   12:59 PM  Depression screen PHQ 2/9  Decreased Interest 0 0 0 0 0  Down, Depressed, Hopeless 0 0 0 0 0  PHQ - 2 Score 0 0 0 0 0    Allergies  Allergen Reactions   Penicillins Other (See Comments)    Doesn't know   Sulfa Antibiotics Other (See Comments)    Doesn't know   Social History   Social History Narrative   Drinks caffeine, takes a daily vitamin.   Masters degree, Materials engineer.   Exercises routinely.   Wears his seatbelt, firearms locked in the home.   Feels  safe in his relationships.   Past Medical History:  Diagnosis Date   Closed right ankle fracture    Colon polyp 2011   x3 polyps, pts reports 10 year follow up   Delayed  union of metatarsal fracture, left 03/2014   No hardware or surgery   ED (erectile dysfunction) 09/23/2017   Elevated ferritin 2013   479 -->  return to normal   Encounter for long-term (current) use of other medications 09/23/2017   Erectile dysfunction 2013   Had been on Cialis   Generalized osteoarthritis    GERD (gastroesophageal reflux disease) 2013   Hyperlipidemia LDL goal <130    Irregular heart rate 09/23/2017   Neuropathy 09/23/2017   On statin therapy 04/15/2017   Osteopenia 2013   prior h/o of Chronic steroids for 10 years; was on Boniva for 6 months. Now takes calcium/vitamin D daily.   Osteopenia of multiple sites    Other and unspecified hyperlipidemia 09/23/2017   Overweight (BMI 25.0-29.9) 11/22/2016   Peripheral neuropathy 2009   Did not respond to gabapentin by prior records   Prediabetes    RA (rheumatoid arthritis) ([place]) 09/23/2017   Rheumatoid arthritis ([place]) 1998   had been on chronic steroids for about 10 years   Rheumatoid lung ([place])    pt reports having thoracentesis for rheumatoid lung many years ago.  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[place])    Thrombocytopenia ([place]) 2013   Mildly low at 130   Type 2 diabetes mellitus without complication, without long-term current use of insulin ([place]) 11/22/2016   Vitamin D deficiency 11/22/2016   Wears glasses    [facility]   Past Surgical History:  Procedure Laterality Date   ABLATION OF DYSRHYTHMIC FOCUS  02/07/2018   ATRIAL FIBRILLATION ABLATION N/A 02/07/2018   Procedure: ATRIAL FIBRILLATION ABLATION;  Surgeon: Bruce Haw, MD;  Location: [facility];  Service: Cardiovascular;  Laterality: N/A;   COLONOSCOPY     COLONOSCOPY     NO PAST SURGERIES     Family History  Problem Relation Age of Onset   Arthritis Mother    Diabetes Father    Heart disease Father    Arthritis Sister    Diabetes Sister    Alcohol abuse Brother    Drug abuse Brother    Breast cancer Paternal Aunt    Early death Maternal  Grandfather    Colon cancer Neg Hx    Esophageal cancer Neg Hx    Stomach cancer Neg Hx    Rectal cancer Neg Hx    Allergies as of 03/21/2022       Reactions   Penicillins Other (See Comments)   Doesn't know   Sulfa Antibiotics Other (See Comments)   Doesn't know        Medication List        Accurate as of March 21, 2022 11:59 PM. If you have any questions, ask your nurse or doctor.          aspirin 81 MG chewable tablet Chew 81 mg by mouth daily.   b complex vitamins capsule Take 1 capsule by mouth daily.   calcium-vitamin D 500-200 MG-UNIT tablet Commonly known as: OSCAL WITH D Take 1 tablet by mouth daily with breakfast.   doxycycline 100 MG tablet Commonly known as: VIBRA-TABS Take 1 tablet (100 mg total) by mouth 2 (two) times daily. Started by: Howard Pouch, DO   folic acid 1 MG tablet Commonly known as: FOLVITE Take 1 mg by mouth daily.   Humira Pen 40 MG/0.4ML Pnkt Generic drug: Adalimumab SMARTSIG:40 Milligram(s) SUB-Q Every 2 Weeks   meloxicam 15 MG tablet Commonly known as: MOBIC Take 15 mg by mouth daily.   metFORMIN 500 MG tablet Commonly known as: GLUCOPHAGE TAKE 2 TABLETS EVERY       MORNING AND TAKE 1 TABLET  IN THE AFTERNOON.   methotrexate 2.5 MG tablet Commonly known as: RHEUMATREX Take 8 tablets by mouth once a week.   multivitamin capsule Take 1 capsule by mouth daily. Takes womens for extra calcium   omeprazole 20 MG capsule Commonly known as: PRILOSEC Take 1 capsule (20 mg total) by mouth daily.   rosuvastatin 5 MG tablet Commonly known as: CRESTOR Take 1 tablet (5 mg total) by mouth daily.   terbinafine 250 MG tablet Commonly known as: LAMISIL Take 1 tablet (250 mg total) by mouth daily.        All past medical history, surgical history, allergies, family history, immunizations andmedications were updated in the EMR today and reviewed under the history and medication portions of their EMR.      ROS Negative, with the exception of above mentioned in HPI   Objective:  BP 115/68   Pulse 97   Temp (!) 97.5 F (36.4 C) (Oral)   Ht 6' (1.829 m)   Wt 199 lb (90.3  kg)   SpO2 97%   BMI 26.99 kg/m  Body mass index is 26.99 kg/m. Physical Exam Vitals and nursing note reviewed.  Constitutional:      General: He is not in acute distress.    Appearance: Normal appearance. He is not ill-appearing, toxic-appearing or diaphoretic.  HENT:     Head: Normocephalic and atraumatic.  Skin:    General: Skin is warm and dry.     Findings: Erythema present.     Comments: Left lower extremity: Linear vertical abrasion lower portion of shin to just above ankle.  Mild erythema surrounding abrasion.  No drainage.  No bleeding.  Good cap refill.  Neurovascularly intact distally.  Neurological:     Mental Status: He is alert and oriented to person, place, and time. Mental status is at baseline.  Psychiatric:        Mood and Affect: Mood normal.        Behavior: Behavior normal.        Thought Content: Thought content normal.        Judgment: Judgment normal.      No results found. No results found. No results found for this or any previous visit (from the past 24 hour(s)).  Assessment/Plan: XION DEBRUYNE is a 61 y.o. male present for OV for  Leg injury, left, initial encounter Erythema surrounding linear abrasion of his left lower extremity.  There is no drainage.  Possible mild infection causing mild erythremia, but suspect redness is more from tension on the wound given location and/or Neosporin use. -Tdap up-to-date less than 5 years Since he is a diabetic elected to treat with doxycycline twice daily x5 days to be cautious. We discussed wound care today.  Patient is to cleanse daily with warm soapy water. Discontinue Neosporin and hydrogen peroxide use. Dress wound daily after shower with BB ointment, Telfa and Coban. He has an appointment for his chronic medical conditions next  weeks in which we will recheck wound at that time.  Reviewed expectations re: course of current medical issues. Discussed self-management of symptoms. Outlined signs and symptoms indicating need for more acute intervention. Patient verbalized understanding and all questions were answered. Patient received an After-Visit Summary.    No orders of the defined types were placed in this encounter.  Meds ordered this encounter  Medications   doxycycline (VIBRA-TABS) 100 MG tablet    Sig: Take 1 tablet (100 mg total) by mouth 2 (two) times daily.    Dispense:  10 tablet    Refill:  0   Referral Orders  No referral(s) requested today     Note is dictated utilizing voice recognition software. Although note has been proof read prior to signing, occasional typographical errors still can be missed. If any questions arise, please do not hesitate to call for verification.   electronically signed by:  Howard Pouch, DO  [address]

## 2022-03-27 ENCOUNTER — Ambulatory Visit (INDEPENDENT_AMBULATORY_CARE_PROVIDER_SITE_OTHER): Payer: BC Managed Care – PPO | Admitting: Family Medicine

## 2022-03-27 ENCOUNTER — Encounter: Payer: Self-pay | Admitting: Family Medicine

## 2022-03-27 VITALS — BP 112/70 | HR 54 | Temp 97.8°F | Ht 72.0 in | Wt 201.0 lb

## 2022-03-27 DIAGNOSIS — K635 Polyp of colon: Secondary | ICD-10-CM

## 2022-03-27 DIAGNOSIS — I701 Atherosclerosis of renal artery: Secondary | ICD-10-CM | POA: Diagnosis not present

## 2022-03-27 DIAGNOSIS — D696 Thrombocytopenia, unspecified: Secondary | ICD-10-CM

## 2022-03-27 DIAGNOSIS — Z23 Encounter for immunization: Secondary | ICD-10-CM

## 2022-03-27 DIAGNOSIS — Z8679 Personal history of other diseases of the circulatory system: Secondary | ICD-10-CM

## 2022-03-27 DIAGNOSIS — Z79899 Other long term (current) drug therapy: Secondary | ICD-10-CM

## 2022-03-27 DIAGNOSIS — Z9889 Other specified postprocedural states: Secondary | ICD-10-CM

## 2022-03-27 DIAGNOSIS — E1169 Type 2 diabetes mellitus with other specified complication: Secondary | ICD-10-CM

## 2022-03-27 DIAGNOSIS — M0579 Rheumatoid arthritis with rheumatoid factor of multiple sites without organ or systems involvement: Secondary | ICD-10-CM | POA: Diagnosis not present

## 2022-03-27 DIAGNOSIS — E785 Hyperlipidemia, unspecified: Secondary | ICD-10-CM

## 2022-03-27 DIAGNOSIS — M8580 Other specified disorders of bone density and structure, unspecified site: Secondary | ICD-10-CM

## 2022-03-27 DIAGNOSIS — E559 Vitamin D deficiency, unspecified: Secondary | ICD-10-CM

## 2022-03-27 LAB — LIPID PANEL
Cholesterol: 148 mg/dL (ref 0–200)
HDL: 51.3 mg/dL (ref >39.00)
LDL Cholesterol: 69 mg/dL (ref 0–99)
NonHDL: 96.46
Total CHOL/HDL Ratio: 3
Triglycerides: 136 mg/dL (ref 0.0–149.0)
VLDL: 27.2 mg/dL (ref 0.0–40.0)

## 2022-03-27 LAB — COMPREHENSIVE METABOLIC PANEL
ALT: 21 U/L (ref 0–53)
AST: 18 U/L (ref 0–37)
Albumin: 4 g/dL (ref 3.5–5.2)
Alkaline Phosphatase: 76 U/L (ref 39–117)
BUN: 19 mg/dL (ref 6–23)
CO2: 32 mEq/L (ref 19–32)
Calcium: 9.6 mg/dL (ref 8.4–10.5)
Chloride: 102 mEq/L (ref 96–112)
Creatinine, Ser: 0.92 mg/dL (ref 0.40–1.50)
GFR: 89.02 mL/min (ref >60.00)
Glucose, Bld: 168 mg/dL — ABNORMAL HIGH (ref 70–99)
Potassium: 5.3 mEq/L — ABNORMAL HIGH (ref 3.5–5.1)
Sodium: 139 mEq/L (ref 135–145)
Total Bilirubin: 0.7 mg/dL (ref 0.2–1.2)
Total Protein: 6.9 g/dL (ref 6.0–8.3)

## 2022-03-27 LAB — MICROALBUMIN / CREATININE URINE RATIO
Creatinine,U: 106.5 mg/dL
Microalb Creat Ratio: 0.7 mg/g (ref 0.0–30.0)
Microalb, Ur: <0.7 mg/dL (ref 0.0–1.9)

## 2022-03-27 LAB — HEMOGLOBIN A1C: Hgb A1c MFr Bld: 7.4 % — ABNORMAL HIGH (ref 4.6–6.5)

## 2022-03-27 LAB — CBC
HCT: 44.4 % (ref 39.0–52.0)
Hemoglobin: 15.1 g/dL (ref 13.0–17.0)
MCHC: 34 g/dL (ref 30.0–36.0)
MCV: 95 fl (ref 78.0–100.0)
Platelets: 137 10*3/uL — ABNORMAL LOW (ref 150.0–400.0)
RBC: 4.67 Mil/uL (ref 4.22–5.81)
RDW: 13.5 % (ref 11.5–15.5)
WBC: 5.4 10*3/uL (ref 4.0–10.5)

## 2022-03-27 LAB — TSH: TSH: 1.84 u[IU]/mL (ref 0.35–5.50)

## 2022-03-27 MED ORDER — ROSUVASTATIN CALCIUM 5 MG PO TABS: 5.0000 mg | ORAL_TABLET | Freq: Every day | ORAL | 3 refills | Status: DC

## 2022-03-27 MED ORDER — OMEPRAZOLE 20 MG PO CPDR: 20.0000 mg | DELAYED_RELEASE_CAPSULE | Freq: Every day | ORAL | 1 refills | Status: DC

## 2022-03-27 MED ORDER — METFORMIN HCL 500 MG PO TABS: ORAL_TABLET | ORAL | 1 refills | Status: DC

## 2022-03-27 NOTE — Progress Notes (Signed)
Patient ID: Bruce Kelly, male  DOB: [birth year], 61 y.o.   MRN: [id number] Patient Care Team    Relationship Specialty Notifications Start End  [name], DO PCP - General Family Medicine  11/21/16   [name], MD PCP - Electrophysiology Cardiology Admissions 12/24/17   [name]  11/22/16   [name], PA-C (Inactive)  Family Medicine  09/26/17   [name], MD Consulting Physician Cardiology  12/06/17     Chief Complaint  Patient presents with   Diabetes    [facility]; pt is fasting    Subjective: Bruce Kelly is a 61 y.o. male present for [facility] History of atrial fibrillation s/p ablation/hyperlipidemia: Patient reports he is doing well s/p his ablation 02/2018.Marland Kitchen   He is not no longer in need of amiodarone.  He reports compliance with Crestor   Osteopenia/vitamin D deficiency:   DEXA   up-to-date through his rheumatologist.  Osteopenia.  He is supplementing with vit d.    GERD: Prilosec 20 mg daily controls symptoms - he needs to continue or symptoms resurface.  Symptoms are currently well controlled   Rheumatoid arthritis: He reports overall his rheumatoid arthritis is stable.    Currently prescribed humira, methotrexate, folate and Mobic by rheumatology.   Diabetes:   Patient reports compliance with metformin 1000/500  Diagnosed with diabetes May 2018, prediabetic for some time.  He has attended the nutrition classes.  Patient denies dizziness, hyperglycemic or hypoglycemic events. Patient denies numbness, tingling in the extremities or nonhealing wounds of feet.  He has a wound that is healing of his left shin.       03/21/2022   10:43 AM 07/20/2020    9:08 AM 02/04/2020    8:57 AM 12/05/2017    8:27 AM 04/15/2017   12:59 PM  Depression screen PHQ 2/9  Decreased Interest 0 0 0 0 0  Down, Depressed, Hopeless 0 0 0 0 0  PHQ - 2 Score 0 0 0 0 0       No data to display           Past Medical History:   Diagnosis Date   Closed right ankle fracture    Colon polyp 2011   x3 polyps, pts reports 10 year follow up   Delayed union of metatarsal fracture, left 03/2014   No hardware or surgery   ED (erectile dysfunction) 09/23/2017   Elevated ferritin 2013   479 -->  return to normal   Encounter for long-term (current) use of other medications 09/23/2017   Erectile dysfunction 2013   Had been on Cialis   Generalized osteoarthritis    GERD (gastroesophageal reflux disease) 2013   Hyperlipidemia LDL goal <130    Irregular heart rate 09/23/2017   Neuropathy 09/23/2017   On statin therapy 04/15/2017   Osteopenia 2013   prior h/o of Chronic steroids for 10 years; was on Boniva for 6 months. Now takes calcium/vitamin D daily.   Osteopenia of multiple sites    Other and unspecified hyperlipidemia 09/23/2017   Overweight (BMI 25.0-29.9) 11/22/2016   Peripheral neuropathy 2009   Did not respond to gabapentin by prior records   Prediabetes    RA (rheumatoid arthritis) ([place]) 09/23/2017   Rheumatoid arthritis ([place]) 1998   had been on chronic steroids for about 10 years   Rheumatoid lung ([place])    pt reports having thoracentesis for rheumatoid lung many years ago.  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[place])    Thrombocytopenia ([place]) 2013   Mildly low at 130   Type 2 diabetes mellitus without complication, without long-term current use of insulin ([place]) 11/22/2016   Vitamin D deficiency 11/22/2016   Wears glasses    [facility]   Allergies  Allergen Reactions   Penicillins Other (See Comments)    Doesn't know   Sulfa Antibiotics Other (See Comments)    Doesn't know   Past Surgical History:  Procedure Laterality Date   ABLATION OF DYSRHYTHMIC FOCUS  02/07/2018   ATRIAL FIBRILLATION ABLATION N/A 02/07/2018   Procedure: ATRIAL FIBRILLATION ABLATION;  Surgeon: [name], MD;  Location: [place] [facility];  Service: Cardiovascular;  Laterality: N/A;   COLONOSCOPY     COLONOSCOPY     NO PAST  SURGERIES     Family History  Problem Relation Age of Onset   Arthritis Mother    Diabetes Father    Heart disease Father    Arthritis Sister    Diabetes Sister    Alcohol abuse Brother    Drug abuse Brother    Breast cancer Paternal Aunt    Early death Maternal Grandfather    Colon cancer Neg Hx    Esophageal cancer Neg Hx    Stomach cancer Neg Hx    Rectal cancer Neg Hx    Social History   Social History Narrative   Drinks caffeine, takes a daily vitamin.   Masters degree, Materials engineer.   Exercises routinely.   Wears his seatbelt, firearms locked in the home.   Feels  safe in his relationships.    Allergies as of 03/27/2022       Reactions   Penicillins Other (See Comments)   Doesn't know   Sulfa Antibiotics Other (See Comments)   Doesn't know        Medication List        Accurate as of March 27, 2022  9:31 AM. If you have any questions, ask your nurse or doctor.          STOP taking these medications    doxycycline 100 MG tablet Commonly known as: VIBRA-TABS Stopped by: Howard Pouch, DO       TAKE these medications    aspirin 81 MG chewable tablet Chew 81 mg by mouth daily.   b complex vitamins capsule Take 1 capsule by mouth daily.   calcium-vitamin D 500-200 MG-UNIT tablet Commonly known as: OSCAL WITH D Take 1 tablet by mouth daily with breakfast.   folic acid 1 MG tablet Commonly known as: FOLVITE Take 1 mg by mouth daily.   Humira Pen 40 MG/0.4ML Pnkt Generic drug: Adalimumab SMARTSIG:40 Milligram(s) SUB-Q Every 2 Weeks   meloxicam 15 MG tablet Commonly known as: MOBIC Take 15 mg by mouth daily.   metFORMIN 500 MG tablet Commonly known as: GLUCOPHAGE TAKE 2 TABLETS EVERY  MORNING AND TAKE 1 TABLET  IN THE AFTERNOON. What changed: See the new instructions. Changed by: Howard Pouch, DO   methotrexate 2.5 MG tablet Commonly known as: RHEUMATREX Take 8 tablets by mouth once a week.   multivitamin capsule Take 1 capsule  by mouth daily. Takes womens for extra calcium   omeprazole 20 MG capsule Commonly known as: PRILOSEC Take 1 capsule (20 mg total) by mouth daily.   rosuvastatin 5 MG tablet Commonly known as: CRESTOR Take 1 tablet (5 mg total) by mouth daily.       All past medical history, surgical history,  allergies, family history, immunizations andmedications were updated in the EMR today and reviewed under the history and medication portions of their EMR.     Recent Results (from the past 2160 hour(s))  Comprehensive metabolic panel     Status: None   Collection Time: 03/08/22 12:00 AM  Result Value Ref Range   eGFR 98     ROS: 14 pt review of systems performed and negative (unless mentioned in an HPI)  Objective: BP 112/70   Pulse (!) 54   Temp 97.8 F (36.6 C) (Oral)   Ht 6' (1.829 m)   Wt 201 lb (91.2 kg)   SpO2 97%   BMI 27.26 kg/m  Physical Exam Vitals and nursing note reviewed.  Constitutional:      General: He is not in acute distress.    Appearance: Normal appearance. He is not ill-appearing, toxic-appearing or diaphoretic.  HENT:     Head: Normocephalic and atraumatic.     Mouth/Throat:     Mouth: Mucous membranes are moist.  Eyes:     General: No scleral icterus.       Right eye: No discharge.        Left eye: No discharge.     Extraocular Movements: Extraocular movements intact.     Pupils: Pupils are equal, round, and reactive to light.  Cardiovascular:     Rate and Rhythm: Normal rate and regular rhythm.  Pulmonary:     Effort: Pulmonary effort is normal. No respiratory distress.     Breath sounds: Normal breath sounds. No wheezing, rhonchi or rales.  Musculoskeletal:        General: Signs of injury present.     Cervical back: Neck supple.     Right lower leg: No edema.     Left lower leg: No edema.  Lymphadenopathy:     Cervical: No cervical adenopathy.  Skin:    General: Skin is warm and dry.     Coloration: Skin is not jaundiced or pale.      Findings: No rash.  Neurological:     Mental Status: He is alert and oriented to person, place, and time. Mental status is at baseline.  Psychiatric:        Mood and Affect: Mood normal.        Behavior: Behavior normal.        Thought Content: Thought content normal.        Judgment: Judgment normal.    Diabetic Foot Exam - Simple   Simple Foot Form Diabetic Foot exam was performed with the following findings: Yes 03/27/2022  9:08 AM  Visual Inspection No deformities, no ulcerations, no other skin breakdown bilaterally: Yes Sensation Testing Pulse Check Posterior Tibialis and Dorsalis pulse intact bilaterally: Yes Comments     No results found.  Assessment/plan: Bruce Kelly is a 61 y.o. male present for CPE Type 2 diabetes mellitus with hyperlipidemia Has been stable-lost to follow-up over a year -Continue metformin 500 mg t - Continue statin-Crestor 5 mg every other day PNA series: Prevnar 1302/21/2019, Pneumovax completed 08/13/2018 Flu shot: Flu shot administered (recommneded yearly) Urine microalbumin: Collected today Foot exam: Completed 03/27/2022 Eye exam: UTD 09/07/2019, [facility].2022 -Stable A1c 5.8--> 6.4--> 6.1--> 6.1>>6.2>7.2>  6.8 >6.8 >collected today A1c today.  - f/u 5.5 mos Leg injury: Healing well.  Hyperlipidemia LDL goal <130/On statin therapy/Overweight (BMI 25.0-29.9)/Thrombocytopenia (HCC)/Status post ablation of atrial fibrillation -Stable resolution of atrial fibrillation after ablation. -Continue crestor 5 mg QOD.   Gastroesophageal  reflux disease, esophagitis presence not specified Stable continue omeprazole 20 mg    Rheumatoid arthritis with positive rheumatoid factor, involving unspecified site (HCC)/ High risk medication use -Continue follow-up with rheumatology he is on a regimen of methotrexate, Humira folate and Mobic through his rheumatologist.  Vitamin D deficiency/osteopenia Continue supplementing dexa due 2025  (rheum) Stable - labs   Return in about 24 weeks (around 09/11/2022) for Routine chronic condition follow-up.  Orders Placed This Encounter  Procedures   Flu Vaccine QUAD 6+ mos PF IM (Fluarix Quad PF)   CBC   Comp Met (CMET)   TSH   Lipid panel   Hemoglobin A1c   Urine Microalbumin w/creat. ratio   Comprehensive metabolic panel    Meds ordered this encounter  Medications   rosuvastatin (CRESTOR) 5 MG tablet    Sig: Take 1 tablet (5 mg total) by mouth daily.    Dispense:  90 tablet    Refill:  3   omeprazole (PRILOSEC) 20 MG capsule    Sig: Take 1 capsule (20 mg total) by mouth daily.    Dispense:  90 capsule    Refill:  1   metFORMIN (GLUCOPHAGE) 500 MG tablet    Sig: TAKE 2 TABLETS EVERY  MORNING AND TAKE 1 TABLET  IN THE AFTERNOON.    Dispense:  270 tablet    Refill:  1    Referral Orders  No referral(s) requested today    Preventive exam completed today with and additional > 20 Minutes was dedicated to this patient's encounter to include pre-visit review of chart, face-to-face time with patient and post-visit work- which include documentation and prescribing medications and/or ordering test when necessary for more than one chronic medical condition.    Note is dictated utilizing voice recognition software. Although note has been proof read prior to signing, occasional typographical errors still can be missed. If any questions arise, please do not hesitate to call for verification.  Electronically signed by: Howard Pouch, DO [place]

## 2022-03-27 NOTE — Patient Instructions (Addendum)
Return in about 24 weeks (around 09/11/2022) for Routine chronic condition follow-up.        Great to see you today.  I have refilled the medication(s) we provide.   If labs were collected, we will inform you of lab results once received either by echart message or telephone call.   - echart message- for normal results that have been seen by the patient already.   - telephone call: abnormal results or if patient has not viewed results in their echart.

## 2022-03-28 ENCOUNTER — Telehealth: Payer: Self-pay | Admitting: Family Medicine

## 2022-03-28 MED ORDER — DAPAGLIFLOZIN PROPANEDIOL 5 MG PO TABS: 5.0000 mg | ORAL_TABLET | Freq: Every day | ORAL | 1 refills | Status: DC

## 2022-03-28 NOTE — Telephone Encounter (Signed)
LVM for pt to CB regarding results.  

## 2022-03-28 NOTE — Telephone Encounter (Signed)
Patient returning call about lab results.  Please call patient back when available.   [place], I read to him what Dr. Raoul Pitch notes stated; he may have some additional questions after speaking to him about starting new medication.  I told him to write down his questions/concerns and Adolm Joseph will call him back on Thursday.

## 2022-03-28 NOTE — Telephone Encounter (Signed)
Please call patient Bruce Kelly, Bruce Kelly function are normal Blood cell counts and electrolytes are normal Urine is normal for protein levels Cholesterol panel looks very and is at goal.  Diabetes-A1c is elevated to 7.4.  He reported that he was taking the metformin 1000 mg in the morning and 500 mg in the afternoon.  If he was taking this as directed, we need to add an additional medicine to bring his A1c below 7.   -Wilder Glade is a medication for diabetes, it does not drop blood sugars when taking therefore hypoglycemia is not of concern.  It removes the extra sugar through the kidneys.  It is Bruce protective for diabetics as well.  I have called in this medication for him to start daily with his metformin.

## 2022-03-29 NOTE — Telephone Encounter (Signed)
Patient called, the only question he had was if the medication was called in. Confirmed rx was sent.

## 2022-05-23 DIAGNOSIS — E119 Type 2 diabetes mellitus without complications: Secondary | ICD-10-CM | POA: Diagnosis not present

## 2022-05-23 DIAGNOSIS — H2513 Age-related nuclear cataract, bilateral: Secondary | ICD-10-CM | POA: Diagnosis not present

## 2022-05-23 DIAGNOSIS — Z79899 Other long term (current) drug therapy: Secondary | ICD-10-CM | POA: Diagnosis not present

## 2022-05-23 DIAGNOSIS — M0609 Rheumatoid arthritis without rheumatoid factor, multiple sites: Secondary | ICD-10-CM | POA: Diagnosis not present

## 2022-05-23 LAB — HM DIABETES EYE EXAM

## 2022-06-06 ENCOUNTER — Other Ambulatory Visit: Payer: Self-pay | Admitting: Family Medicine

## 2022-06-07 DIAGNOSIS — M0579 Rheumatoid arthritis with rheumatoid factor of multiple sites without organ or systems involvement: Secondary | ICD-10-CM | POA: Diagnosis not present

## 2022-06-20 ENCOUNTER — Other Ambulatory Visit: Payer: Self-pay | Admitting: Family Medicine

## 2022-07-10 DIAGNOSIS — J029 Acute pharyngitis, unspecified: Secondary | ICD-10-CM | POA: Diagnosis not present

## 2022-07-10 DIAGNOSIS — Z03818 Encounter for observation for suspected exposure to other biological agents ruled out: Secondary | ICD-10-CM | POA: Diagnosis not present

## 2022-07-10 DIAGNOSIS — R051 Acute cough: Secondary | ICD-10-CM | POA: Diagnosis not present

## 2022-07-20 ENCOUNTER — Encounter: Payer: Self-pay | Admitting: Family Medicine

## 2022-07-20 ENCOUNTER — Ambulatory Visit (INDEPENDENT_AMBULATORY_CARE_PROVIDER_SITE_OTHER): Payer: BC Managed Care – PPO | Admitting: Family Medicine

## 2022-07-20 VITALS — BP 127/85 | HR 72 | Temp 98.1°F | Wt 186.6 lb

## 2022-07-20 DIAGNOSIS — J189 Pneumonia, unspecified organism: Secondary | ICD-10-CM

## 2022-07-20 DIAGNOSIS — Z125 Encounter for screening for malignant neoplasm of prostate: Secondary | ICD-10-CM | POA: Diagnosis not present

## 2022-07-20 LAB — PSA: PSA: 0.61 ng/mL (ref 0.10–4.00)

## 2022-07-20 MED ORDER — PREDNISONE 50 MG PO TABS: 50.0000 mg | ORAL_TABLET | Freq: Every day | ORAL | 0 refills | Status: DC

## 2022-07-20 MED ORDER — AZITHROMYCIN 250 MG PO TABS: ORAL_TABLET | ORAL | 0 refills | Status: AC

## 2022-07-20 NOTE — Progress Notes (Signed)
Bruce Kelly , 1960-09-06, 62 y.o., male MRN: [id number] Patient Care Team    Relationship Specialty Notifications Start End  [name], DO PCP - General Family Medicine  11/21/16   [name], MD PCP - Electrophysiology Cardiology Admissions 12/24/17   [name]  11/22/16   [name], PA-C (Inactive)  Family Medicine  09/26/17   [name], MD Consulting Physician Cardiology  12/06/17     Chief Complaint  Patient presents with   rsv    Jan 2 went to [facility]. Still having sob/congestion and weight loss     Subjective: Bruce Kelly is a Pt presents for an OV with complaints of respiratory illness.  He was seen in urgent care on January 2 and diagnosed with likely RSV.  He tested negative for COVID and strep.  He reports at that time his symptoms had started 4 days prior.  He was prescribed a 5-day prednisone taper.  He states the first day he thought he was starting to feel little better, however his symptoms continued. He states around generally he was short of breath, labored breathing.  He felt like he could not get a full breath into his lungs.  He was experiencing night sweats.  He had a pulse ox at home and he states at 1 time his pulse ox read 84%.  Today he reports he is improved but still feels like he is not back to his baseline.  He is still feeling difficulty in taking very deep breaths.     03/21/2022   10:43 AM 07/20/2020    9:08 AM 02/04/2020    8:57 AM 12/05/2017    8:27 AM 04/15/2017   12:59 PM  Depression screen PHQ 2/9  Decreased Interest 0 0 0 0 0  Down, Depressed, Hopeless 0 0 0 0 0  PHQ - 2 Score 0 0 0 0 0    Allergies  Allergen Reactions   Penicillins Other (See Comments)    Doesn't know   Sulfa Antibiotics Other (See Comments)    Doesn't know   Social History   Social History Narrative   Drinks caffeine, takes a daily vitamin.   Masters degree, Materials engineer.   Exercises routinely.    Wears his seatbelt, firearms locked in the home.   Feels  safe in his relationships.   Past Medical History:  Diagnosis Date   Closed right ankle fracture    Colon polyp 2011   x3 polyps, pts reports 10 year follow up   Delayed union of metatarsal fracture, left 03/2014   No hardware or surgery   ED (erectile dysfunction) 09/23/2017   Elevated ferritin 2013   479 -->  return to normal   Encounter for long-term (current) use of other medications 09/23/2017   Erectile dysfunction 2013   Had been on Cialis   Generalized osteoarthritis    GERD (gastroesophageal reflux disease) 2013   Hyperlipidemia LDL goal <130    Irregular heart rate 09/23/2017   Neuropathy 09/23/2017   On statin therapy 04/15/2017   Osteopenia 2013   prior h/o of Chronic steroids for 10 years; was on Boniva for 6 months. Now takes calcium/vitamin D daily.   Osteopenia of multiple sites    Other and unspecified hyperlipidemia 09/23/2017   Overweight (BMI 25.0-29.9) 11/22/2016   Peripheral neuropathy 2009   Did not respond to gabapentin by prior records   Prediabetes  RA (rheumatoid arthritis) ([place]) 09/23/2017   Rheumatoid arthritis ([place]) 1998   had been on chronic steroids for about 10 years   Rheumatoid lung ([place])    pt reports having thoracentesis for rheumatoid lung many years ago.    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[place])    Thrombocytopenia ([place]) 2013   Mildly low at 130   Type 2 diabetes mellitus without complication, without long-term current use of insulin ([place]) 11/22/2016   Vitamin D deficiency 11/22/2016   Wears glasses    [facility]   Past Surgical History:  Procedure Laterality Date   ABLATION OF DYSRHYTHMIC FOCUS  02/07/2018   ATRIAL FIBRILLATION ABLATION N/A 02/07/2018   Procedure: ATRIAL FIBRILLATION ABLATION;  Surgeon: [name], MD;  Location: [place] [facility];  Service: Cardiovascular;  Laterality: N/A;   COLONOSCOPY     COLONOSCOPY     NO PAST SURGERIES     Family History  Problem  Relation Age of Onset   Arthritis Mother    Diabetes Father    Heart disease Father    Arthritis Sister    Diabetes Sister    Alcohol abuse Brother    Drug abuse Brother    Breast cancer Paternal Aunt    Early death Maternal Grandfather    Colon cancer Neg Hx    Esophageal cancer Neg Hx    Stomach cancer Neg Hx    Rectal cancer Neg Hx    Allergies as of 07/20/2022       Reactions   Penicillins Other (See Comments)   Doesn't know   Sulfa Antibiotics Other (See Comments)   Doesn't know        Medication List        Accurate as of July 20, 2022  2:29 PM. If you have any questions, ask your nurse or doctor.          aspirin 81 MG chewable tablet Chew 81 mg by mouth daily.   azithromycin 250 MG tablet Commonly known as: ZITHROMAX Take 2 tablets on day 1, then 1 tablet daily on days 2 through 5   b complex vitamins capsule Take 1 capsule by mouth daily.   calcium-vitamin D 500-200 MG-UNIT tablet Commonly known as: OSCAL WITH D Take 1 tablet by mouth daily with breakfast.   dapagliflozin propanediol 5 MG Tabs tablet Commonly known as: Farxiga Take 1 tablet (5 mg total) by mouth daily before breakfast.   folic acid 1 MG tablet Commonly known as: FOLVITE Take 1 mg by mouth daily.   Humira (2 Pen) 40 MG/0.4ML Pnkt Generic drug: Adalimumab SMARTSIG:40 Milligram(s) SUB-Q Every 2 Weeks   meloxicam 15 MG tablet Commonly known as: MOBIC Take 15 mg by mouth daily.   metFORMIN 500 MG tablet Commonly known as: GLUCOPHAGE TAKE 2 TABLETS EVERY  MORNING AND TAKE 1 TABLET  IN THE AFTERNOON.   methotrexate 2.5 MG tablet Commonly known as: RHEUMATREX Take 8 tablets by mouth once a week.   multivitamin capsule Take 1 capsule by mouth daily. Takes womens for extra calcium   omeprazole 20 MG capsule Commonly known as: PRILOSEC Take 1 capsule (20 mg total) by mouth daily.   predniSONE 50 MG tablet Commonly known as: DELTASONE Take 1 tablet (50 mg total) by  mouth daily with breakfast.   rosuvastatin 5 MG tablet Commonly known as: CRESTOR TAKE 1 TABLET DAILY        All past medical history, surgical history, allergies, family history, immunizations andmedications were updated in the  EMR today and reviewed under the history and medication portions of their EMR.     ROS Negative, with the exception of above mentioned in HPI   Objective:  BP 127/85   Pulse 72   Temp 98.1 F (36.7 C)   Wt 186 lb 9.6 oz (84.6 kg)   SpO2 97%   BMI 25.31 kg/m  Body mass index is 25.31 kg/m. Physical Exam Vitals and nursing note reviewed.  Constitutional:      General: He is not in acute distress.    Appearance: Normal appearance. He is not ill-appearing, toxic-appearing or diaphoretic.  HENT:     Head: Normocephalic and atraumatic.     Nose: Rhinorrhea present. No congestion.  Eyes:     General: No scleral icterus.       Right eye: No discharge.        Left eye: No discharge.     Extraocular Movements: Extraocular movements intact.     Pupils: Pupils are equal, round, and reactive to light.  Cardiovascular:     Rate and Rhythm: Normal rate and regular rhythm.     Heart sounds: No murmur heard. Pulmonary:     Effort: Pulmonary effort is normal. No respiratory distress.     Breath sounds: Rales present. No wheezing or rhonchi.  Musculoskeletal:     Cervical back: Neck supple.  Skin:    General: Skin is warm and dry.     Coloration: Skin is not jaundiced or pale.     Findings: No rash.  Neurological:     Mental Status: He is alert and oriented to person, place, and time. Mental status is at baseline.  Psychiatric:        Mood and Affect: Mood normal.        Behavior: Behavior normal.        Thought Content: Thought content normal.        Judgment: Judgment normal.      No results found. No results found. No results found for this or any previous visit (from the past 24 hour(s)).  Assessment/Plan: Bruce Kelly is a 62 y.o. male  present for OV for  Prostate cancer screening Overdue - PSA  Walking pneumonia Exam is consistent with possible viral/walking pneumonia with very fine crackles at bilateral bases today.  He is still symptomatic, although symptoms are much improved from onset 14 days ago. Rest.  Hydrate. Azithromycin and prednisone 50 prescribed x 5 days. Follow-up 2 weeks as needed  Reviewed expectations re: course of current medical issues. Discussed self-management of symptoms. Outlined signs and symptoms indicating need for more acute intervention. Patient verbalized understanding and all questions were answered. Patient received an After-Visit Summary.    Orders Placed This Encounter  Procedures   PSA   Meds ordered this encounter  Medications   azithromycin (ZITHROMAX) 250 MG tablet    Sig: Take 2 tablets on day 1, then 1 tablet daily on days 2 through 5    Dispense:  6 tablet    Refill:  0   predniSONE (DELTASONE) 50 MG tablet    Sig: Take 1 tablet (50 mg total) by mouth daily with breakfast.    Dispense:  10 tablet    Refill:  0   Referral Orders  No referral(s) requested today     Note is dictated utilizing voice recognition software. Although note has been proof read prior to signing, occasional typographical errors still can be missed. If any questions arise, please do not  hesitate to call for verification.   electronically signed by:  Howard Pouch, DO  [place]

## 2022-09-11 ENCOUNTER — Ambulatory Visit: Payer: BC Managed Care – PPO | Admitting: Family Medicine

## 2022-09-12 ENCOUNTER — Telehealth: Payer: Self-pay | Admitting: Family Medicine

## 2022-09-12 NOTE — Telephone Encounter (Signed)
Pt would like to make sure that Rosuvastatin goes to the correct pharmacy. He is no longer using mail order pharmacy and needs it to go to [facility] in [place].

## 2022-09-12 NOTE — Telephone Encounter (Signed)
Pt has upcoming appt. Will change pharmacy at that time. Last Rx sent 11/23 plus 1 refill

## 2022-09-18 ENCOUNTER — Ambulatory Visit (INDEPENDENT_AMBULATORY_CARE_PROVIDER_SITE_OTHER): Payer: BC Managed Care – PPO | Admitting: Family Medicine

## 2022-09-18 ENCOUNTER — Encounter: Payer: Self-pay | Admitting: Family Medicine

## 2022-09-18 VITALS — BP 127/81 | HR 63 | Wt 186.4 lb

## 2022-09-18 DIAGNOSIS — I701 Atherosclerosis of renal artery: Secondary | ICD-10-CM

## 2022-09-18 DIAGNOSIS — Z79899 Other long term (current) drug therapy: Secondary | ICD-10-CM | POA: Diagnosis not present

## 2022-09-18 DIAGNOSIS — Z9889 Other specified postprocedural states: Secondary | ICD-10-CM

## 2022-09-18 DIAGNOSIS — M8580 Other specified disorders of bone density and structure, unspecified site: Secondary | ICD-10-CM

## 2022-09-18 DIAGNOSIS — E785 Hyperlipidemia, unspecified: Secondary | ICD-10-CM | POA: Diagnosis not present

## 2022-09-18 DIAGNOSIS — E1169 Type 2 diabetes mellitus with other specified complication: Secondary | ICD-10-CM | POA: Diagnosis not present

## 2022-09-18 DIAGNOSIS — E559 Vitamin D deficiency, unspecified: Secondary | ICD-10-CM

## 2022-09-18 DIAGNOSIS — D696 Thrombocytopenia, unspecified: Secondary | ICD-10-CM

## 2022-09-18 DIAGNOSIS — Z8679 Personal history of other diseases of the circulatory system: Secondary | ICD-10-CM

## 2022-09-18 DIAGNOSIS — M0579 Rheumatoid arthritis with rheumatoid factor of multiple sites without organ or systems involvement: Secondary | ICD-10-CM

## 2022-09-18 LAB — POCT GLYCOSYLATED HEMOGLOBIN (HGB A1C)
HbA1c POC (<> result, manual entry): 7.5 % (ref 4.0–5.6)
HbA1c, POC (controlled diabetic range): 7.5 % — AB (ref 0.0–7.0)
HbA1c, POC (prediabetic range): 7.5 % — AB (ref 5.7–6.4)
Hemoglobin A1C: 7.5 % — AB (ref 4.0–5.6)

## 2022-09-18 MED ORDER — DAPAGLIFLOZIN PROPANEDIOL 10 MG PO TABS: 10.0000 mg | ORAL_TABLET | Freq: Every day | ORAL | 1 refills | Status: DC

## 2022-09-18 MED ORDER — METFORMIN HCL 500 MG PO TABS: ORAL_TABLET | ORAL | 1 refills | Status: DC

## 2022-09-18 MED ORDER — OMEPRAZOLE 20 MG PO CPDR: 20.0000 mg | DELAYED_RELEASE_CAPSULE | Freq: Every day | ORAL | 1 refills | Status: DC

## 2022-09-18 NOTE — Progress Notes (Signed)
Patient ID: Bruce Kelly, male  DOB: [birth year], 62 y.o.   MRN: [id number] Patient Care Team    Relationship Specialty Notifications Start End  [name], DO PCP - General Family Medicine  11/21/16   [name], MD PCP - Electrophysiology Cardiology Admissions 12/24/17   [name]  11/22/16   [name], PA-C (Inactive)  Family Medicine  09/26/17   [name], MD Consulting Physician Cardiology  12/06/17     Chief Complaint  Patient presents with   Diabetes    Subjective: Bruce Kelly is a 62 y.o. male present for [facility] History of atrial fibrillation s/p ablation/hyperlipidemia: Patient reports he is doing well s/p his ablation 02/2018. He is not no longer in need of amiodarone.  He reports compliance with Crestor and baby aspirin daily.   Osteopenia/vitamin D deficiency:   DEXA   up-to-date through his rheumatologist.  Osteopenia.  He is supplementing with vit d.    GERD: Prilosec 20 mg daily controls symptoms for him.- he needs to continue or symptoms resurface.     Rheumatoid arthritis: He reports overall his rheumatoid arthritis is well-controlled.    Currently prescribed humira, methotrexate, folate and Mobic by rheumatology.   Diabetes:   Patient reports compliance with metformin 500 and Farxiga 5 mg daily started last visit. Diagnosed with diabetes May 2018, prediabetic for some time.  He has attended the nutrition classes.  Patient denies dizziness, hyperglycemic or hypoglycemic events. Patient denies numbness, tingling in the extremities or nonhealing wounds of feet.       03/21/2022   10:43 AM 07/20/2020    9:08 AM 02/04/2020    8:57 AM 12/05/2017    8:27 AM 04/15/2017   12:59 PM  Depression screen PHQ 2/9  Decreased Interest 0 0 0 0 0  Down, Depressed, Hopeless 0 0 0 0 0  PHQ - 2 Score 0 0 0 0 0       No data to display           Past Medical History:  Diagnosis Date   Closed right ankle  fracture    Colon polyp 2011   x3 polyps, pts reports 10 year follow up   Delayed union of metatarsal fracture, left 03/2014   No hardware or surgery   ED (erectile dysfunction) 09/23/2017   Elevated ferritin 2013   479 -->  return to normal   Encounter for long-term (current) use of other medications 09/23/2017   Erectile dysfunction 2013   Had been on Cialis   Generalized osteoarthritis    GERD (gastroesophageal reflux disease) 2013   Hyperlipidemia LDL goal <130    Irregular heart rate 09/23/2017   Neuropathy 09/23/2017   On statin therapy 04/15/2017   Osteopenia 2013   prior h/o of Chronic steroids for 10 years; was on Boniva for 6 months. Now takes calcium/vitamin D daily.   Osteopenia of multiple sites    Other and unspecified hyperlipidemia 09/23/2017   Overweight (BMI 25.0-29.9) 11/22/2016   Peripheral neuropathy 2009   Did not respond to gabapentin by prior records   Prediabetes    RA (rheumatoid arthritis) ([place]) 09/23/2017   Rheumatoid arthritis ([place]) 1998   had been on chronic steroids for about 10 years   Rheumatoid lung ([place])    pt reports having thoracentesis for rheumatoid lung many years ago.    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HCC)    Thrombocytopenia ([place]) 2013   Mildly  low at 130   Type 2 diabetes mellitus without complication, without long-term current use of insulin ([place]) 11/22/2016   Vitamin D deficiency 11/22/2016   Wears glasses    [facility]   Allergies  Allergen Reactions   Penicillins Other (See Comments)    Doesn't know   Sulfa Antibiotics Other (See Comments)    Doesn't know   Past Surgical History:  Procedure Laterality Date   ABLATION OF DYSRHYTHMIC FOCUS  02/07/2018   ATRIAL FIBRILLATION ABLATION N/A 02/07/2018   Procedure: ATRIAL FIBRILLATION ABLATION;  Surgeon: [name], MD;  Location: [place] [facility];  Service: Cardiovascular;  Laterality: N/A;   COLONOSCOPY     COLONOSCOPY     NO PAST SURGERIES     Family History  Problem  Relation Age of Onset   Arthritis Mother    Diabetes Father    Heart disease Father    Arthritis Sister    Diabetes Sister    Alcohol abuse Brother    Drug abuse Brother    Breast cancer Paternal Aunt    Early death Maternal Grandfather    Colon cancer Neg Hx    Esophageal cancer Neg Hx    Stomach cancer Neg Hx    Rectal cancer Neg Hx    Social History   Social History Narrative   Drinks caffeine, takes a daily vitamin.   Masters degree, Materials engineer.   Exercises routinely.   Wears his seatbelt, firearms locked in the home.   Feels  safe in his relationships.    Allergies as of 09/18/2022       Reactions   Penicillins Other (See Comments)   Doesn't know   Sulfa Antibiotics Other (See Comments)   Doesn't know        Medication List        Accurate as of September 18, 2022  9:50 AM. If you have any questions, ask your nurse or doctor.          STOP taking these medications    predniSONE 50 MG tablet Commonly known as: DELTASONE Stopped by: Howard Pouch, DO       TAKE these medications    aspirin 81 MG chewable tablet Chew 81 mg by mouth daily.   b complex vitamins capsule Take 1 capsule by mouth daily.   calcium-vitamin D 500-200 MG-UNIT tablet Commonly known as: OSCAL WITH D Take 1 tablet by mouth daily with breakfast.   dapagliflozin propanediol 10 MG Tabs tablet Commonly known as: Farxiga Take 1 tablet (10 mg total) by mouth daily before breakfast. What changed:  medication strength how much to take Changed by: Howard Pouch, DO   folic acid 1 MG tablet Commonly known as: FOLVITE Take 1 mg by mouth daily.   Humira (2 Pen) 40 MG/0.4ML Pnkt Generic drug: Adalimumab SMARTSIG:40 Milligram(s) SUB-Q Every 2 Weeks   meloxicam 15 MG tablet Commonly known as: MOBIC Take 15 mg by mouth daily.   metFORMIN 500 MG tablet Commonly known as: GLUCOPHAGE TAKE 2 TABLETS EVERY  MORNING AND TAKE 1 TABLET  IN THE AFTERNOON.   methotrexate 2.5 MG  tablet Commonly known as: RHEUMATREX Take 8 tablets by mouth once a week.   multivitamin capsule Take 1 capsule by mouth daily. Takes womens for extra calcium   omeprazole 20 MG capsule Commonly known as: PRILOSEC Take 1 capsule (20 mg total) by mouth daily.   rosuvastatin 5 MG tablet Commonly known as: CRESTOR TAKE 1 TABLET DAILY  All past medical history, surgical history, allergies, family history, immunizations andmedications were updated in the EMR today and reviewed under the history and medication portions of their EMR.     Recent Results (from the past 2160 hour(s))  PSA     Status: None   Collection Time: 07/20/22 10:41 AM  Result Value Ref Range   PSA 0.61 0.10 - 4.00 ng/mL    Comment: Test performed using Access [employer]britech PSA Assay, a parmagnetic partical, chemiluminecent immunoassay.  POCT glycosylated hemoglobin (Hb A1C)     Status: Abnormal   Collection Time: 09/18/22  9:38 AM  Result Value Ref Range   Hemoglobin A1C 7.5 (A) 4.0 - 5.6 %   HbA1c POC (<> result, manual entry) 7.5 4.0 - 5.6 %   HbA1c, POC (prediabetic range) 7.5 (A) 5.7 - 6.4 %   HbA1c, POC (controlled diabetic range) 7.5 (A) 0.0 - 7.0 %    ROS: 14 pt review of systems performed and negative (unless mentioned in an HPI)  Objective: BP 127/81   Pulse 63   Wt 186 lb 6.4 oz (84.6 kg)   SpO2 100%   BMI 25.28 kg/m  Physical Exam Vitals and nursing note reviewed.  Constitutional:      General: He is not in acute distress.    Appearance: Normal appearance. He is not ill-appearing, toxic-appearing or diaphoretic.  HENT:     Head: Normocephalic and atraumatic.  Eyes:     General: No scleral icterus.       Right eye: No discharge.        Left eye: No discharge.     Extraocular Movements: Extraocular movements intact.     Pupils: Pupils are equal, round, and reactive to light.  Cardiovascular:     Rate and Rhythm: Normal rate and regular rhythm.  Pulmonary:     Effort: Pulmonary effort  is normal. No respiratory distress.     Breath sounds: Normal breath sounds. No wheezing, rhonchi or rales.  Musculoskeletal:     Right lower leg: No edema.     Left lower leg: No edema.  Skin:    General: Skin is warm.     Findings: No rash.  Neurological:     Mental Status: He is alert and oriented to person, place, and time. Mental status is at baseline.  Psychiatric:        Mood and Affect: Mood normal.        Behavior: Behavior normal.        Thought Content: Thought content normal.        Judgment: Judgment normal.    No results found.  Assessment/plan: Bruce Kelly is a 62 y.o. male present for  Type 2 diabetes mellitus with hyperlipidemia Has been stable-lost to follow-up over a year Continue metformin 500 mg  Continue Crestor 5 mg every other day Increase farxiga 10 mg qd.  PNA series: Series completed until after 63 Flu shot: Up-to-date (recommneded yearly) Urine microalbumin: UTD 03/2022 Foot exam: Completed 03/27/2022 Eye exam: UTD 11//2023, [facility] -Stable A1c 5.8--> 6.4--> 6.1--> 6.1>>6.2>7.2>  6.8 >6.8 >7.4 > 7.5  A1c today.   Hyperlipidemia LDL goal <130/On statin therapy/Overweight (BMI 25.0-29.9)/Thrombocytopenia (HCC)/Status post ablation of atrial fibrillation Stable Resolution of atrial fibrillation after ablation. Continue crestor 5 mg QOD.   Gastroesophageal reflux disease, esophagitis presence not specified Stable continue omeprazole 20 mg    Rheumatoid arthritis with positive rheumatoid factor, involving unspecified site (HCC)/ High risk medication use -Continue follow-up with  rheumatology he is on a regimen of methotrexate, Humira folate and Mobic through his rheumatologist.  Vitamin D deficiency/osteopenia Continue supplementing dexa due 2025 (rheum) Lab stable  Return in about 6 months (around 03/25/2023) for cpe (20 min), Routine chronic condition follow-up.  Orders Placed This Encounter  Procedures   POCT glycosylated  hemoglobin (Hb A1C)    Meds ordered this encounter  Medications   dapagliflozin propanediol (FARXIGA) 10 MG TABS tablet    Sig: Take 1 tablet (10 mg total) by mouth daily before breakfast.    Dispense:  90 tablet    Refill:  1   metFORMIN (GLUCOPHAGE) 500 MG tablet    Sig: TAKE 2 TABLETS EVERY  MORNING AND TAKE 1 TABLET  IN THE AFTERNOON.    Dispense:  270 tablet    Refill:  1   omeprazole (PRILOSEC) 20 MG capsule    Sig: Take 1 capsule (20 mg total) by mouth daily.    Dispense:  90 capsule    Refill:  1    Referral Orders  No referral(s) requested today   Note is dictated utilizing voice recognition software. Although note has been proof read prior to signing, occasional typographical errors still can be missed. If any questions arise, please do not hesitate to call for verification.  Electronically signed by: Howard Pouch, DO [place]

## 2022-09-18 NOTE — Patient Instructions (Addendum)
Return in about 6 months (around 03/25/2023) for cpe (20 min), Routine chronic condition follow-up.        Great to see you today.  I have refilled the medication(s) we provide.   If labs were collected, we will inform you of lab results once received either by echart message or telephone call.   - echart message- for normal results that have been seen by the patient already.   - telephone call: abnormal results or if patient has not viewed results in their echart.

## 2022-09-26 DIAGNOSIS — Z79899 Other long term (current) drug therapy: Secondary | ICD-10-CM | POA: Diagnosis not present

## 2022-09-26 DIAGNOSIS — M0579 Rheumatoid arthritis with rheumatoid factor of multiple sites without organ or systems involvement: Secondary | ICD-10-CM | POA: Diagnosis not present

## 2022-09-26 DIAGNOSIS — M1991 Primary osteoarthritis, unspecified site: Secondary | ICD-10-CM | POA: Diagnosis not present

## 2022-09-29 ENCOUNTER — Other Ambulatory Visit: Payer: Self-pay | Admitting: Family Medicine

## 2022-12-21 ENCOUNTER — Encounter: Payer: Self-pay | Admitting: Family Medicine

## 2022-12-25 ENCOUNTER — Other Ambulatory Visit (HOSPITAL_COMMUNITY): Payer: Self-pay

## 2022-12-25 NOTE — Telephone Encounter (Signed)
Make sure there was a prior auth completed  If needing an alternative med, we will need what is covered by his formulary.

## 2022-12-26 DIAGNOSIS — M0579 Rheumatoid arthritis with rheumatoid factor of multiple sites without organ or systems involvement: Secondary | ICD-10-CM | POA: Diagnosis not present

## 2022-12-26 NOTE — Telephone Encounter (Signed)
Please check with patient's insurance to see what medication is preferred before we call in another prescription. Specifically check on Jardiance, as this would be a good substitute [place] if covered.

## 2022-12-26 NOTE — Telephone Encounter (Signed)
Spoke with wife to have pt send recommendations thru [employer]

## 2023-02-21 ENCOUNTER — Encounter (INDEPENDENT_AMBULATORY_CARE_PROVIDER_SITE_OTHER): Payer: Self-pay

## 2023-04-03 ENCOUNTER — Encounter: Payer: BC Managed Care – PPO | Admitting: Family Medicine

## 2023-04-11 DIAGNOSIS — M0579 Rheumatoid arthritis with rheumatoid factor of multiple sites without organ or systems involvement: Secondary | ICD-10-CM | POA: Diagnosis not present

## 2023-04-11 DIAGNOSIS — R5383 Other fatigue: Secondary | ICD-10-CM | POA: Diagnosis not present

## 2023-04-11 DIAGNOSIS — M1991 Primary osteoarthritis, unspecified site: Secondary | ICD-10-CM | POA: Diagnosis not present

## 2023-04-11 DIAGNOSIS — Z79899 Other long term (current) drug therapy: Secondary | ICD-10-CM | POA: Diagnosis not present

## 2023-04-15 ENCOUNTER — Encounter: Payer: Self-pay | Admitting: Family Medicine

## 2023-04-15 ENCOUNTER — Ambulatory Visit (INDEPENDENT_AMBULATORY_CARE_PROVIDER_SITE_OTHER): Payer: BC Managed Care – PPO | Admitting: Family Medicine

## 2023-04-15 VITALS — BP 108/66 | HR 56 | Temp 97.4°F | Ht 72.0 in | Wt 190.6 lb

## 2023-04-15 DIAGNOSIS — Z9889 Other specified postprocedural states: Secondary | ICD-10-CM

## 2023-04-15 DIAGNOSIS — E559 Vitamin D deficiency, unspecified: Secondary | ICD-10-CM | POA: Diagnosis not present

## 2023-04-15 DIAGNOSIS — Z79899 Other long term (current) drug therapy: Secondary | ICD-10-CM

## 2023-04-15 DIAGNOSIS — M8589 Other specified disorders of bone density and structure, multiple sites: Secondary | ICD-10-CM

## 2023-04-15 DIAGNOSIS — Z125 Encounter for screening for malignant neoplasm of prostate: Secondary | ICD-10-CM

## 2023-04-15 DIAGNOSIS — M0579 Rheumatoid arthritis with rheumatoid factor of multiple sites without organ or systems involvement: Secondary | ICD-10-CM

## 2023-04-15 DIAGNOSIS — Z23 Encounter for immunization: Secondary | ICD-10-CM | POA: Diagnosis not present

## 2023-04-15 DIAGNOSIS — Z8679 Personal history of other diseases of the circulatory system: Secondary | ICD-10-CM

## 2023-04-15 DIAGNOSIS — E785 Hyperlipidemia, unspecified: Secondary | ICD-10-CM

## 2023-04-15 DIAGNOSIS — E1169 Type 2 diabetes mellitus with other specified complication: Secondary | ICD-10-CM | POA: Diagnosis not present

## 2023-04-15 DIAGNOSIS — I701 Atherosclerosis of renal artery: Secondary | ICD-10-CM

## 2023-04-15 DIAGNOSIS — Z0000 Encounter for general adult medical examination without abnormal findings: Secondary | ICD-10-CM | POA: Diagnosis not present

## 2023-04-15 LAB — LIPID PANEL
Cholesterol: 155 mg/dL (ref 0–200)
HDL: 54.2 mg/dL (ref >39.00)
LDL Cholesterol: 61 mg/dL (ref 0–99)
NonHDL: 100.82
Total CHOL/HDL Ratio: 3
Triglycerides: 198 mg/dL — ABNORMAL HIGH (ref 0.0–149.0)
VLDL: 39.6 mg/dL (ref 0.0–40.0)

## 2023-04-15 LAB — COMPREHENSIVE METABOLIC PANEL
ALT: 20 U/L (ref 0–53)
AST: 21 U/L (ref 0–37)
Albumin: 4.2 g/dL (ref 3.5–5.2)
Alkaline Phosphatase: 67 U/L (ref 39–117)
BUN: 18 mg/dL (ref 6–23)
CO2: 30 meq/L (ref 19–32)
Calcium: 9.4 mg/dL (ref 8.4–10.5)
Chloride: 101 meq/L (ref 96–112)
Creatinine, Ser: 0.92 mg/dL (ref 0.40–1.50)
GFR: 88.37 mL/min (ref >60.00)
Glucose, Bld: 159 mg/dL — ABNORMAL HIGH (ref 70–99)
Potassium: 4.6 meq/L (ref 3.5–5.1)
Sodium: 138 meq/L (ref 135–145)
Total Bilirubin: 1 mg/dL (ref 0.2–1.2)
Total Protein: 6.7 g/dL (ref 6.0–8.3)

## 2023-04-15 LAB — CBC WITH DIFFERENTIAL/PLATELET
Basophils Absolute: 0 10*3/uL (ref 0.0–0.1)
Basophils Relative: 0.8 % (ref 0.0–3.0)
Eosinophils Absolute: 0.2 10*3/uL (ref 0.0–0.7)
Eosinophils Relative: 3.8 % (ref 0.0–5.0)
HCT: 47.6 % (ref 39.0–52.0)
Hemoglobin: 15.9 g/dL (ref 13.0–17.0)
Lymphocytes Relative: 37.9 % (ref 12.0–46.0)
Lymphs Abs: 2 10*3/uL (ref 0.7–4.0)
MCHC: 33.4 g/dL (ref 30.0–36.0)
MCV: 97.4 fL (ref 78.0–100.0)
Monocytes Absolute: 0.4 10*3/uL (ref 0.1–1.0)
Monocytes Relative: 8.6 % (ref 3.0–12.0)
Neutro Abs: 2.5 10*3/uL (ref 1.4–7.7)
Neutrophils Relative %: 48.9 % (ref 43.0–77.0)
Platelets: 143 10*3/uL — ABNORMAL LOW (ref 150.0–400.0)
RBC: 4.89 Mil/uL (ref 4.22–5.81)
RDW: 14.2 % (ref 11.5–15.5)
WBC: 5.2 10*3/uL (ref 4.0–10.5)

## 2023-04-15 LAB — HEMOGLOBIN A1C: Hgb A1c MFr Bld: 6.6 % — ABNORMAL HIGH (ref 4.6–6.5)

## 2023-04-15 LAB — MAGNESIUM: Magnesium: 1.8 mg/dL (ref 1.5–2.5)

## 2023-04-15 LAB — B12 AND FOLATE PANEL
Folate: >24.2 ng/mL (ref >5.9)
Vitamin B-12: 378 pg/mL (ref 211–911)

## 2023-04-15 LAB — PSA: PSA: 0.59 ng/mL (ref 0.10–4.00)

## 2023-04-15 LAB — TSH: TSH: 2.14 u[IU]/mL (ref 0.35–5.50)

## 2023-04-15 LAB — VITAMIN D 25 HYDROXY (VIT D DEFICIENCY, FRACTURES): VITD: 34.29 ng/mL (ref 30.00–100.00)

## 2023-04-15 MED ORDER — OMEPRAZOLE 20 MG PO CPDR: 20.0000 mg | DELAYED_RELEASE_CAPSULE | Freq: Every day | ORAL | 1 refills | Status: DC

## 2023-04-15 MED ORDER — METFORMIN HCL 500 MG PO TABS: ORAL_TABLET | ORAL | 1 refills | Status: DC

## 2023-04-15 MED ORDER — ROSUVASTATIN CALCIUM 5 MG PO TABS: 5.0000 mg | ORAL_TABLET | Freq: Every day | ORAL | 1 refills | Status: DC

## 2023-04-15 MED ORDER — DAPAGLIFLOZIN PROPANEDIOL 10 MG PO TABS: 10.0000 mg | ORAL_TABLET | Freq: Every day | ORAL | 1 refills | Status: DC

## 2023-04-15 NOTE — Progress Notes (Signed)
Patient ID: Bruce Kelly, male  DOB: 1961-03-14, 62 y.o.   MRN: [id number] Patient Care Team    Relationship Specialty Notifications Start End  Natalia Leatherwood, DO PCP - General Family Medicine  11/21/16   Regan Lemming, MD PCP - Electrophysiology Cardiology Admissions 12/24/17   [facility]  11/22/16   Windy Carina, PA-C (Inactive)  Family Medicine  09/26/17   Baldo Daub, MD Consulting Physician Cardiology  12/06/17   Napoleon Form, MD Consulting Physician Gastroenterology  04/15/23     Chief Complaint  Patient presents with   Annual Exam    Pt is fasting; cmc    Subjective:  Bruce Kelly is a 62 y.o. male present for CPE and Chronic Conditions/illness Management All past medical history, surgical history, allergies, family history, immunizations, medications and social history were updated in the electronic medical record today. All recent labs, ED visits and hospitalizations within the last year were reviewed.  History of atrial fibrillation s/p ablation/hyperlipidemia: Patient reports he still doing well since his ablation.He does report compliance with Crestor 5 mg. Lipids: 12/05/2017 total cholesterol 173, HDL 43, LDL 103, triglycerides 135   Osteopenia/vitamin D deficiency:   DEXA   2020 with osteopenia (-1.4), rpt 5 yrs patient reports compliance with vitamin D supplementation   GERD: Patient reports compliance with use of Prilosec 20 mg daily, without medication symptoms returned.   Rheumatoid arthritis: He reports overall his rheumatoid arthritis is stable.  He does have some foot discomfort. Established with rheumatology.  Methotrexate, Humira, Mobic   Diabetes:   Patient reports compliance metformin. Never tool farxiga $$ Diagnosed with diabetes May 2018. He has attended the nutrition classes.  Patient denies dizziness, hyperglycemic or hypoglycemic events. Patient denies numbness, tingling in the extremities  or nonhealing wounds of feet.   Health maintenance:  Colonoscopy: last screen 05/17/2020, recommend follow up 10 years. Dr. Lavon Paganini Immunizations:  tdap UTD 11/2017, influenza  administered today,PNA series completed 2020,  Shingrix series completed, COVID (pfizer) completed Infectious disease screening: HIV and Hep C completed PSA:  Lab Results  Component Value Date   PSA 0.61 07/20/2022   PSA 0.49 03/29/2021   PSA 0.41 02/04/2020  , pt was counseled on prostate cancer screenings.  DEXA: 01/2019- 5 yr follow up (rheum) Assistive device: none Oxygen WUJ:WJXB Patient has a Dental home. Hospitalizations/ED visits: reviewed     03/21/2022   10:43 AM 07/20/2020    9:08 AM 02/04/2020    8:57 AM 12/05/2017    8:27 AM 04/15/2017   12:59 PM  Depression screen PHQ 2/9  Decreased Interest 0 0 0 0 0  Down, Depressed, Hopeless 0 0 0 0 0  PHQ - 2 Score 0 0 0 0 0       No data to display                12/05/2017    8:40 AM 12/05/2017    8:27 AM 12/20/2016    2:37 PM 11/21/2016    1:05 PM  Fall Risk   Falls in the past year? No No No No    Immunization History  Administered Date(s) Administered   Influenza, Seasonal, Injecte, Preservative Fre 04/15/2023   Influenza,inj,Quad PF,6+ Mos 04/15/2017, 04/05/2020, 03/29/2021, 03/27/2022   Influenza-Unspecified 07/28/2015, 03/19/2018   PFIZER(Purple Top)SARS-COV-2 Vaccination 10/02/2019, 10/30/2019, 06/06/2020   Pneumococcal Conjugate-13 08/29/2017   Pneumococcal Polysaccharide-23 08/13/2018   Tdap 12/05/2017   Zoster Recombinant(Shingrix)  08/13/2018, 01/20/2019    Past Medical History:  Diagnosis Date   Closed right ankle fracture    Colon polyp 2011   x3 polyps, pts reports 10 year follow up   Delayed union of metatarsal fracture, left 03/2014   No hardware or surgery   ED (erectile dysfunction) 09/23/2017   Elevated ferritin 2013   479 -->  return to normal   Encounter for long-term (current) use of other medications 09/23/2017    Erectile dysfunction 2013   Had been on Cialis   Generalized osteoarthritis    GERD (gastroesophageal reflux disease) 2013   Hyperlipidemia LDL goal <130    Irregular heart rate 09/23/2017   Neuropathy 09/23/2017   On statin therapy 04/15/2017   Osteopenia 2013   prior h/o of Chronic steroids for 10 years; was on Boniva for 6 months. Now takes calcium/vitamin D daily.   Osteopenia of multiple sites    Other and unspecified hyperlipidemia 09/23/2017   Overweight (BMI 25.0-29.9) 11/22/2016   Peripheral neuropathy 2009   Did not respond to gabapentin by prior records   Prediabetes    RA (rheumatoid arthritis) (HCC) 09/23/2017   Rheumatoid arthritis (HCC) 1998   had been on chronic steroids for about 10 years   Rheumatoid lung (HCC)    pt reports having thoracentesis for rheumatoid lung many years ago.    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HCC)    Thrombocytopenia (HCC) 2013   Mildly low at 130   Type 2 diabetes mellitus without complication, without long-term current use of insulin (HCC) 11/22/2016   Vitamin D deficiency 11/22/2016   Wears glasses    [facility]   Allergies  Allergen Reactions   Penicillins Other (See Comments)    Doesn't know   Sulfa Antibiotics Other (See Comments)    Doesn't know   Past Surgical History:  Procedure Laterality Date   ABLATION OF DYSRHYTHMIC FOCUS  02/07/2018   ATRIAL FIBRILLATION ABLATION N/A 02/07/2018   Procedure: ATRIAL FIBRILLATION ABLATION;  Surgeon: Regan Lemming, MD;  Location: [facility];  Service: Cardiovascular;  Laterality: N/A;   COLONOSCOPY     COLONOSCOPY     NO PAST SURGERIES     Family History  Problem Relation Age of Onset   Arthritis Mother    Diabetes Father    Heart disease Father    Arthritis Sister    Diabetes Sister    Alcohol abuse Brother    Drug abuse Brother    Breast cancer Paternal Aunt    Early death Maternal Grandfather    Colon cancer Neg Hx    Esophageal cancer Neg Hx    Stomach cancer Neg Hx     Rectal cancer Neg Hx    Social History   Social History Narrative   Drinks caffeine, takes a daily vitamin.   Masters degree, Psychiatric nurse.   Exercises routinely.   Wears his seatbelt, firearms locked in the home.   Feels  safe in his relationships.    Allergies as of 04/15/2023       Reactions   Penicillins Other (See Comments)   Doesn't know   Sulfa Antibiotics Other (See Comments)   Doesn't know        Medication List        Accurate as of April 15, 2023  9:10 AM. If you have any questions, ask your nurse or doctor.          STOP taking these medications    dapagliflozin propanediol 10  MG Tabs tablet Commonly known as: Farxiga Stopped by: Felix Pacini       TAKE these medications    aspirin 81 MG chewable tablet Chew 81 mg by mouth daily.   b complex vitamins capsule Take 1 capsule by mouth daily.   calcium-vitamin D 500-200 MG-UNIT tablet Commonly known as: OSCAL WITH D Take 1 tablet by mouth daily with breakfast.   folic acid 1 MG tablet Commonly known as: FOLVITE Take 1 mg by mouth daily.   Humira (2 Pen) 40 MG/0.4ML pen Generic drug: adalimumab SMARTSIG:40 Milligram(s) SUB-Q Every 2 Weeks   meloxicam 15 MG tablet Commonly known as: MOBIC Take 15 mg by mouth daily.   metFORMIN 500 MG tablet Commonly known as: GLUCOPHAGE TAKE 2 TABLETS EVERY  MORNING AND TAKE 1 TABLET  IN THE AFTERNOON.   methotrexate 2.5 MG tablet Commonly known as: RHEUMATREX Take 8 tablets by mouth once a week.   multivitamin capsule Take 1 capsule by mouth daily. Takes womens for extra calcium   omeprazole 20 MG capsule Commonly known as: PRILOSEC Take 1 capsule (20 mg total) by mouth daily.   rosuvastatin 5 MG tablet Commonly known as: CRESTOR Take 1 tablet (5 mg total) by mouth daily.       All past medical history, surgical history, allergies, family history, immunizations andmedications were updated in the EMR today and reviewed under the history  and medication portions of their EMR.     No results found for this or any previous visit (from the past 2160 hour(s)).  ROS: 14 pt review of systems performed and negative (unless mentioned in an HPI)  Objective: BP 108/66   Pulse (!) 56   Temp (!) 97.4 F (36.3 C)   Ht 6' (1.829 m)   Wt 190 lb 9.6 oz (86.5 kg)   SpO2 95%   BMI 25.85 kg/m  Physical Exam Constitutional:      General: He is not in acute distress.    Appearance: Normal appearance. He is not ill-appearing, toxic-appearing or diaphoretic.  HENT:     Head: Normocephalic and atraumatic.     Right Ear: Tympanic membrane, ear canal and external ear normal. There is no impacted cerumen.     Left Ear: Tympanic membrane, ear canal and external ear normal. There is no impacted cerumen.     Nose: Nose normal. No congestion or rhinorrhea.     Mouth/Throat:     Mouth: Mucous membranes are moist.     Pharynx: Oropharynx is clear. No oropharyngeal exudate or posterior oropharyngeal erythema.  Eyes:     General: No scleral icterus.       Right eye: No discharge.        Left eye: No discharge.     Extraocular Movements: Extraocular movements intact.     Pupils: Pupils are equal, round, and reactive to light.  Cardiovascular:     Rate and Rhythm: Normal rate and regular rhythm.     Pulses: Normal pulses.     Heart sounds: Normal heart sounds. No murmur heard.    No friction rub. No gallop.  Pulmonary:     Effort: Pulmonary effort is normal. No respiratory distress.     Breath sounds: Normal breath sounds. No stridor. No wheezing, rhonchi or rales.  Chest:     Chest wall: No tenderness.  Abdominal:     General: Abdomen is flat. Bowel sounds are normal. There is no distension.     Palpations: Abdomen is soft. There is  no mass.     Tenderness: There is no abdominal tenderness. There is no right CVA tenderness, left CVA tenderness, guarding or rebound.     Hernia: No hernia is present.  Musculoskeletal:        General: No  swelling or tenderness. Normal range of motion.     Cervical back: Normal range of motion and neck supple.     Right lower leg: No edema.     Left lower leg: No edema.  Lymphadenopathy:     Cervical: No cervical adenopathy.  Skin:    General: Skin is warm and dry.     Coloration: Skin is not jaundiced.     Findings: No bruising, lesion or rash.  Neurological:     General: No focal deficit present.     Mental Status: He is alert and oriented to person, place, and time. Mental status is at baseline.     Cranial Nerves: No cranial nerve deficit.     Sensory: No sensory deficit.     Motor: No weakness.     Coordination: Coordination normal.     Gait: Gait normal.     Deep Tendon Reflexes: Reflexes normal.  Psychiatric:        Mood and Affect: Mood normal.        Behavior: Behavior normal.        Thought Content: Thought content normal.        Judgment: Judgment normal.     No results found.  Assessment/plan: Bruce Kelly is a 62 y.o. male present for CPE and Chronic Conditions/illness Management Type 2 diabetes mellitus without complication, without long-term current use of insulin (HCC) stable - Continue with metformin 500 mg twice a day. Did not take farxiga- $$ - jardiance 10 now on formulary.  PNA series: Prevnar 1302/21/2019, Pneumovax completed 08/13/2018 Flu shot: Flu shot administered today(recommneded yearly) Urine microalbumin: Collected today 04/15/2023 Foot exam: Completed 04/15/2023 Eye exam: UTD 05/2022 [facility]. A1c collected  Hyperlipidemia LDL goal <130/On statin therapy/Overweight (BMI 25.0-29.9)/Thrombocytopenia (HCC)/Status post ablation of atrial fibrillation -Stable resolution of atrial fibrillation after ablation. -Continue crestor.  CBC, CMP, TSH and lipids collected today  Gastroesophageal reflux disease, esophagitis presence not specified Stable Continue omeprazole 20 mg    Rheumatoid arthritis with positive rheumatoid factor, involving  unspecified site (HCC)/ High risk medication use -Rheumatoid arthritis is stable. Marland Kitchen   He is on a regimen of methotrexate, Humira, folate and Mobic through his rheumatologist. B12, folate> neuropathy  Vitamin D deficiency/osteopenia Supplementing dexa due 2025 (rheum) Vitamin D collected today Neuropathy high-risk medications - B12 and Folate Panel  Encounter for Preventive health exam: Patient was encouraged to exercise greater than 150 minutes a week. Patient was encouraged to choose a diet filled with fresh fruits and vegetables, and lean meats. AVS provided to patient today for education/recommendation on gender specific health and safety maintenance. Colonoscopy: last screen 05/17/2020, recommend follow up 10 years. Dr. Lavon Paganini Immunizations:  tdap UTD 11/2017, influenza  administered today,PNA series completed 2020,  Shingrix series completed, COVID (pfizer) completed Infectious disease screening: HIV and Hep C completed  Return in about 24 weeks (around 09/30/2023).  Orders Placed This Encounter  Procedures   Flu vaccine trivalent PF, 6mos and older(Flulaval,Afluria,Fluarix,Fluzone)   CBC with Differential/Platelet   Comprehensive metabolic panel   Hemoglobin A1c   PSA   TSH   Lipid panel   Urine Microalbumin w/creat. ratio   Vitamin D (25 hydroxy)   B12 and Folate Panel  Magnesium   Meds ordered this encounter  Medications   DISCONTD: dapagliflozin propanediol (FARXIGA) 10 MG TABS tablet    Sig: Take 1 tablet (10 mg total) by mouth daily before breakfast.    Dispense:  90 tablet    Refill:  1   metFORMIN (GLUCOPHAGE) 500 MG tablet    Sig: TAKE 2 TABLETS EVERY  MORNING AND TAKE 1 TABLET  IN THE AFTERNOON.    Dispense:  270 tablet    Refill:  1   omeprazole (PRILOSEC) 20 MG capsule    Sig: Take 1 capsule (20 mg total) by mouth daily.    Dispense:  90 capsule    Refill:  1   rosuvastatin (CRESTOR) 5 MG tablet    Sig: Take 1 tablet (5 mg total) by mouth daily.     Dispense:  90 tablet    Refill:  1   Referral Orders  No referral(s) requested today    Note is dictated utilizing voice recognition software. Although note has been proof read prior to signing, occasional typographical errors still can be missed. If any questions arise, please do not hesitate to call for verification.  Electronically signed by: Felix Pacini, DO [facility] [place]

## 2023-04-15 NOTE — Patient Instructions (Addendum)
Return in about 24 weeks (around 09/30/2023).        Great to see you today.  I have refilled the medication(s) we provide.   If labs were collected or images ordered, we will inform you of  results once we have received them and reviewed. We will contact you either by echart message, or telephone call.  Please give ample time to the testing facility, and our office to run,  receive and review results. Please do not call inquiring of results, even if you can see them in your chart. We will contact you as soon as we are able. If it has been over 1 week since the test was completed, and you have not yet heard from [place], then please call us.    - echart message- for normal results that have been seen by the patient already.   - telephone call: abnormal results or if patient has not viewed results in their echart.  If a referral to a specialist was entered for you, please call us in 2 weeks if you have not heard from the specialist office to schedule.

## 2023-04-16 LAB — MICROALBUMIN / CREATININE URINE RATIO
Creatinine,U: 64.2 mg/dL
Microalb Creat Ratio: 1.1 mg/g (ref 0.0–30.0)
Microalb, Ur: <0.7 mg/dL (ref 0.0–1.9)

## 2023-05-27 DIAGNOSIS — E119 Type 2 diabetes mellitus without complications: Secondary | ICD-10-CM | POA: Diagnosis not present

## 2023-05-27 DIAGNOSIS — M0609 Rheumatoid arthritis without rheumatoid factor, multiple sites: Secondary | ICD-10-CM | POA: Diagnosis not present

## 2023-05-27 DIAGNOSIS — Z79899 Other long term (current) drug therapy: Secondary | ICD-10-CM | POA: Diagnosis not present

## 2023-07-22 DIAGNOSIS — M0579 Rheumatoid arthritis with rheumatoid factor of multiple sites without organ or systems involvement: Secondary | ICD-10-CM | POA: Diagnosis not present

## 2023-09-18 ENCOUNTER — Encounter: Payer: Self-pay | Admitting: Urgent Care

## 2023-09-18 ENCOUNTER — Ambulatory Visit: Admitting: Urgent Care

## 2023-09-18 ENCOUNTER — Ambulatory Visit: Payer: Self-pay | Admitting: Family Medicine

## 2023-09-18 VITALS — BP 122/80 | HR 65 | Temp 97.7°F | Wt 180.0 lb

## 2023-09-18 DIAGNOSIS — T63441A Toxic effect of venom of bees, accidental (unintentional), initial encounter: Secondary | ICD-10-CM

## 2023-09-18 DIAGNOSIS — T7840XA Allergy, unspecified, initial encounter: Secondary | ICD-10-CM | POA: Diagnosis not present

## 2023-09-18 DIAGNOSIS — H02844 Edema of left upper eyelid: Secondary | ICD-10-CM

## 2023-09-18 DIAGNOSIS — H02845 Edema of left lower eyelid: Secondary | ICD-10-CM

## 2023-09-18 DIAGNOSIS — H02842 Edema of right lower eyelid: Secondary | ICD-10-CM | POA: Diagnosis not present

## 2023-09-18 DIAGNOSIS — H02841 Edema of right upper eyelid: Secondary | ICD-10-CM

## 2023-09-18 MED ORDER — HYDROXYZINE PAMOATE 25 MG PO CAPS
25.0000 mg | ORAL_CAPSULE | Freq: Three times a day (TID) | ORAL | 0 refills | Status: DC | PRN
Start: 2023-09-18 — End: 2024-03-20

## 2023-09-18 MED ORDER — METHYLPREDNISOLONE ACETATE 80 MG/ML IJ SUSP
80.0000 mg | Freq: Once | INTRAMUSCULAR | Status: AC
Start: 2023-09-18 — End: 2023-09-18
  Administered 2023-09-18: 80 mg via INTRAMUSCULAR

## 2023-09-18 MED ORDER — FAMOTIDINE 20 MG PO TABS
20.0000 mg | ORAL_TABLET | Freq: Two times a day (BID) | ORAL | 0 refills | Status: DC
Start: 2023-09-18 — End: 2024-03-20

## 2023-09-18 MED ORDER — PREDNISONE 10 MG (21) PO TBPK
ORAL_TABLET | Freq: Every day | ORAL | 0 refills | Status: DC
Start: 2023-09-18 — End: 2023-09-30

## 2023-09-18 NOTE — Progress Notes (Signed)
 Established Patient Office Visit  Subjective:  Patient ID: Bruce Kelly, male    DOB: [birth year]  Age: 63 y.o. MRN: [id number]  Chief Complaint  Patient presents with   Insect Bite    Pt was stung by a bee yesterday after noon. Above his right eye and both of his eyes are swollen shut. He did take Benadryl at 5 am    Pleasant 63yo male presents with concerns of swelling to his b eyelids. 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 Additionally he states he has been stung in the past but never had the reaction he had today. Was stung above the R eyebrow yesterday around 5pm. Had rather immediate swelling to R upper eyelid. Took 50mg  of benadryl last night and again this morning without relief. Woke up this morning to both upper and lower lids bilaterally swollen. Denies swelling of uvula, dysphagia, SOB, wheezing, dyspnea, N/V, hypersalivation, or hives. No systemic symptoms. No fever.    Patient Active Problem List   Diagnosis Date Noted   Status post ablation of atrial fibrillation 08/14/2018   Colon polyp 09/23/2017   ED (erectile dysfunction) 09/23/2017   Elevated ferritin 09/23/2017   On statin therapy 04/15/2017   Vitamin D deficiency 11/22/2016   High risk medication use 11/22/2016   Type 2 diabetes mellitus with hyperlipidemia (HCC)    Thrombocytopenia (HCC) 07/10/2011   Osteopenia 07/10/2011   GERD (gastroesophageal reflux disease) 07/10/2011   Atherosclerosis of renal artery (HCC) 07/10/2007   Rheumatoid arthritis (HCC) 07/09/1996   Past Medical History:  Diagnosis Date   Closed right ankle fracture    Colon polyp 2011   x3 polyps, pts reports 10 year follow up   Delayed union of metatarsal fracture, left 03/2014   No hardware or surgery   ED (erectile dysfunction) 09/23/2017   Elevated ferritin 2013   479 -->  return to normal   Encounter for long-term (current) use of other medications 09/23/2017   Erectile dysfunction 2013   Had been on Cialis    Generalized osteoarthritis    GERD (gastroesophageal reflux disease) 2013   Hyperlipidemia LDL goal <130    Irregular heart rate 09/23/2017   Neuropathy 09/23/2017   On statin therapy 04/15/2017   Osteopenia 2013   prior h/o of Chronic steroids for 10 years; was on Boniva for 6 months. Now takes calcium/vitamin D daily.   Osteopenia of multiple sites    Other and unspecified hyperlipidemia 09/23/2017   Overweight (BMI 25.0-29.9) 11/22/2016   Peripheral neuropathy 2009   Did not respond to gabapentin by prior records   Prediabetes    RA (rheumatoid arthritis) (HCC) 09/23/2017   Rheumatoid arthritis (HCC) 1998   had been on chronic steroids for about 10 years   Rheumatoid lung (HCC)    pt reports having thoracentesis for rheumatoid lung many years ago.    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HCC)    Thrombocytopenia (HCC) 2013   Mildly low at 130   Type 2 diabetes mellitus without complication, without long-term current use of insulin (HCC) 11/22/2016   Vitamin D deficiency 11/22/2016   Wears glasses    [facility]      ROS: as noted in HPI  Objective:     BP 122/80   Pulse 65   Temp 97.7 F (36.5 C) (Oral)   Wt 180 lb (81.6 kg)   SpO2 96%   BMI 24.41 kg/m  BP Readings from Last 3 Encounters:  09/18/23 122/80  04/15/23 108/66  09/18/22 127/81   Wt Readings from Last 3 Encounters:  09/18/23 180 lb (81.6 kg)  04/15/23 190 lb 9.6 oz (86.5 kg)  09/18/22 186 lb 6.4 oz (84.6 kg)      Physical Exam Vitals and nursing note reviewed. Exam conducted with a chaperone present.  Constitutional:      General: He is not in acute distress.    Appearance: Normal appearance. He is normal weight. He is not ill-appearing or toxic-appearing.  HENT:     Head: Normocephalic and atraumatic.     Right Ear: Tympanic membrane, ear canal and external ear normal. There is no impacted cerumen.     Left Ear: Tympanic membrane, ear canal and external ear normal. There is no impacted cerumen.     Nose:  Nose normal. No congestion or rhinorrhea.     Mouth/Throat:     Mouth: Mucous membranes are moist.     Pharynx: Oropharynx is clear. No oropharyngeal exudate or posterior oropharyngeal erythema.  Eyes:     General: No scleral icterus.       Right eye: No discharge.        Left eye: No discharge.     Extraocular Movements: Extraocular movements intact.     Pupils: Pupils are equal, round, and reactive to light.     Comments: Significant eyelid edema noted to both upper and lower lids bilaterally. (+) transillumination. NO erythema or warmth. No signs of cellulitis  Cardiovascular:     Rate and Rhythm: Normal rate and regular rhythm.     Heart sounds: No murmur heard. Pulmonary:     Effort: Pulmonary effort is normal. No respiratory distress.     Breath sounds: Normal breath sounds. No stridor. No wheezing, rhonchi or rales.  Musculoskeletal:     Cervical back: Normal range of motion. No rigidity.  Lymphadenopathy:     Cervical: No cervical adenopathy.  Skin:    General: Skin is warm and dry.     Coloration: Skin is not jaundiced.     Findings: No bruising, erythema or rash.  Neurological:     General: No focal deficit present.     Mental Status: He is alert and oriented to person, place, and time.      No results found for any visits on 09/18/23.  Last CBC Lab Results  Component Value Date   WBC 5.2 04/15/2023   HGB 15.9 04/15/2023   HCT 47.6 04/15/2023   MCV 97.4 04/15/2023   MCH 31.8 03/29/2021   RDW 14.2 04/15/2023   PLT 143.0 (L) 04/15/2023   Last metabolic panel Lab Results  Component Value Date   GLUCOSE 159 (H) 04/15/2023   NA 138 04/15/2023   K 4.6 04/15/2023   CL 101 04/15/2023   CO2 30 04/15/2023   BUN 18 04/15/2023   CREATININE 0.92 04/15/2023   GFR 88.37 04/15/2023   CALCIUM 9.4 04/15/2023   PROT 6.7 04/15/2023   ALBUMIN 4.2 04/15/2023   BILITOT 1.0 04/15/2023   ALKPHOS 67 04/15/2023   AST 21 04/15/2023   ALT 20 04/15/2023      The 10-year  ASCVD risk score (Arnett DK, et al., 2019) is: 16%  Assessment & Plan:  Allergic reaction, initial encounter -     predniSONE; Take by mouth daily. Take 6 tabs by mouth daily  for 1 days, then 5 tabs for 1 days, then 4 tabs for 1 days, then 3 tabs for 1 days, 2 tabs for 1 days, then 1 tab by  mouth daily for 1 days  Dispense: 21 tablet; Refill: 0 -     hydrOXYzine Pamoate; Take 1 capsule (25 mg total) by mouth every 8 (eight) hours as needed.  Dispense: 30 capsule; Refill: 0 -     Famotidine; Take 1 tablet (20 mg total) by mouth 2 (two) times daily.  Dispense: 12 tablet; Refill: 0 -     methylPREDNISolone Acetate  Bee sting reaction, accidental or unintentional, initial encounter -     predniSONE; Take by mouth daily. Take 6 tabs by mouth daily  for 1 days, then 5 tabs for 1 days, then 4 tabs for 1 days, then 3 tabs for 1 days, 2 tabs for 1 days, then 1 tab by mouth daily for 1 days  Dispense: 21 tablet; Refill: 0 -     hydrOXYzine Pamoate; Take 1 capsule (25 mg total) by mouth every 8 (eight) hours as needed.  Dispense: 30 capsule; Refill: 0 -     Famotidine; Take 1 tablet (20 mg total) by mouth 2 (two) times daily.  Dispense: 12 tablet; Refill: 0 -     methylPREDNISolone Acetate  Edema of upper and lower eyelids of both eyes -     predniSONE; Take by mouth daily. Take 6 tabs by mouth daily  for 1 days, then 5 tabs for 1 days, then 4 tabs for 1 days, then 3 tabs for 1 days, 2 tabs for 1 days, then 1 tab by mouth daily for 1 days  Dispense: 21 tablet; Refill: 0 -     hydrOXYzine Pamoate; Take 1 capsule (25 mg total) by mouth every 8 (eight) hours as needed.  Dispense: 30 capsule; Refill: 0  Pt with a localized allergic reaction to face / eyelids without systemic response. No anaphylactic sx noted. However, given severity of edema of eyelids, will give IM depomedrol in office today, and start a 6 day steroid taper tomorrow. Hydroxyzine in place of benadryl and PRN famotidine. RTC precautions  reviewed.\   No follow-ups on file.   Maretta Bees, PA

## 2023-09-18 NOTE — Telephone Encounter (Signed)
  Chief Complaint: bee sting Symptoms: swelling Frequency: Yesterday at 1500 Pertinent Negatives: Patient denies CP, SOB, fever, streaking Disposition: [x] ED /[] Urgent Care (no appt availability in office) / [] Appointment(In office/virtual)/ []  [facility]/ [] Home Care/ [x] Refused Recommended Disposition /[] [facility]/ []  Follow-up with PCP Additional Notes: Patient and wife call reporting patient keeps bees and was stung above L eye yesterday around 1700. States when patient woke up this morning both eyes were swollen shut as well as nose. Reports patient has taken benadryl and advil. Denies any pain or respiratory concerns, difficulty swallowing. Per protocol, patient to present to ED now for evaluation based of symptoms. Care advice reviewed, patient declines ED, requesting to see PCP. This RN contacted [facility], spoke with Annabelle Harman who was able to schedule the patient for 1140 today with alt provider at patient request.  Alerting PCP for review.    Copied from CRM [id number]. Topic: Clinical - Red Word Triage >> Sep 18, 2023  9:44 AM Deaijah H wrote: Red Word that prompted transfer to Nurse Triage: Stung by bee, eye swollen shut and face swollen Reason for Disposition  [1] Hives, itching, or swelling elsewhere on body (i.e., not a site of sting) AND [2] started within 2 hours of sting  (Exception: Only at site of sting.)  Answer Assessment - Initial Assessment Questions 1. TYPE: "What type of sting was it?" (bee, yellow jacket, etc.)      Bee sting  2. ONSET: "When did it occur?"      1700 yesterday 3. LOCATION: "Where is the sting located?"  "How many stings?"     Above L eyebrow, one sting 4. SWELLING SIZE: "How big is the swelling?" (e.g., inches or cm)     Both eyes are swollen shut 5. REDNESS: "Is the area red or pink?" If Yes, ask: "What size is area of redness?" (e.g., inches or cm). "When did the redness start?"     Mild redness 6. PAIN: "Is there any pain?" If Yes,  ask: "How bad is it?"  (Scale 1-10; or mild, moderate, severe)     Denies 7. ITCHING: "Is there any itching?" If Yes, ask: "How bad is it?"      Denies 8. RESPIRATORY DISTRESS: "Describe your breathing."     Normal, no distress noted 9. PRIOR REACTIONS: "Have you had any severe allergic reactions to stings in the past?" if yes, ask: "What happened?"     Yes, but not that close to eyes. 10. OTHER SYMPTOMS: "Do you have any other symptoms?" (e.g., abdomen pain, face or tongue swelling, new rash elsewhere, vomiting)       Denies  Protocols used: Bee or Yellow Jacket Sting-A-AH

## 2023-09-18 NOTE — Patient Instructions (Signed)
 You were given an injection of depomedrol 80mg . Please do not start the prednisone taper until tomorrow, 09/19/23.  Take the hydroxyzine in place of benadryl. Take this every 8 hours as needed, until swelling of lids resolves. This medication may make you sleepy so do not drive or operate machinery after taking.  Take famotidine twice daily x 6 days.  If you do not see rather immediate results (24-48 hours), or more importantly if new symptoms develop, please return for recheck.

## 2023-09-18 NOTE — Telephone Encounter (Signed)
 No further action needed.

## 2023-09-30 ENCOUNTER — Ambulatory Visit (INDEPENDENT_AMBULATORY_CARE_PROVIDER_SITE_OTHER): Payer: BC Managed Care – PPO | Admitting: Family Medicine

## 2023-09-30 ENCOUNTER — Encounter: Payer: Self-pay | Admitting: Family Medicine

## 2023-09-30 VITALS — BP 116/78 | HR 60 | Temp 98.0°F | Wt 198.0 lb

## 2023-09-30 DIAGNOSIS — E1169 Type 2 diabetes mellitus with other specified complication: Secondary | ICD-10-CM

## 2023-09-30 DIAGNOSIS — Z7984 Long term (current) use of oral hypoglycemic drugs: Secondary | ICD-10-CM

## 2023-09-30 DIAGNOSIS — E785 Hyperlipidemia, unspecified: Secondary | ICD-10-CM

## 2023-09-30 LAB — POCT GLYCOSYLATED HEMOGLOBIN (HGB A1C)
HbA1c POC (<> result, manual entry): 6.8 % (ref 4.0–5.6)
HbA1c, POC (controlled diabetic range): 6.8 % (ref 0.0–7.0)
HbA1c, POC (prediabetic range): 6.8 % — AB (ref 5.7–6.4)
Hemoglobin A1C: 6.8 % — AB (ref 4.0–5.6)

## 2023-09-30 MED ORDER — OMEPRAZOLE 20 MG PO CPDR: 20.0000 mg | DELAYED_RELEASE_CAPSULE | Freq: Every day | ORAL | 1 refills | Status: DC

## 2023-09-30 MED ORDER — ROSUVASTATIN CALCIUM 5 MG PO TABS: 5.0000 mg | ORAL_TABLET | Freq: Every day | ORAL | 1 refills | Status: DC

## 2023-09-30 MED ORDER — METFORMIN HCL 500 MG PO TABS: ORAL_TABLET | ORAL | 1 refills | Status: DC

## 2023-09-30 MED ORDER — EPINEPHRINE 0.3 MG/0.3ML IJ SOAJ: 0.3000 mg | INTRAMUSCULAR | 0 refills | Status: DC | PRN

## 2023-09-30 NOTE — Patient Instructions (Addendum)
 Return in about 7 months (around 04/15/2024) for cpe (20 min), Routine chronic condition follow-up.        Great to see you today.  I have refilled the medication(s) we provide.   If labs were collected or images ordered, we will inform you of  results once we have received them and reviewed. We will contact you either by echart message, or telephone call.  Please give ample time to the testing facility, and our office to run,  receive and review results. Please do not call inquiring of results, even if you can see them in your chart. We will contact you as soon as we are able. If it has been over 1 week since the test was completed, and you have not yet heard from [place], then please call us.    - echart message- for normal results that have been seen by the patient already.   - telephone call: abnormal results or if patient has not viewed results in their echart.  If a referral to a specialist was entered for you, please call us in 2 weeks if you have not heard from the specialist office to schedule.

## 2023-09-30 NOTE — Progress Notes (Signed)
 Patient ID: Bruce Kelly, male  DOB: [birth year], 63 y.o.   MRN: [id number] Patient Care Team    Relationship Specialty Notifications Start End  Natalia Leatherwood, DO PCP - General Family Medicine  11/21/16   Regan Lemming, MD PCP - Electrophysiology Cardiology Admissions 12/24/17   [facility]  11/22/16   Windy Carina, PA-C (Inactive)  Family Medicine  09/26/17   Baldo Daub, MD Consulting Physician Cardiology  12/06/17   Napoleon Form, MD Consulting Physician Gastroenterology  04/15/23     Chief Complaint  Patient presents with   Diabetes    Pt is fasting.     Subjective:  Bruce Kelly is a 63 y.o. male present for CPE and Chronic Conditions/illness Management All past medical history, surgical history, allergies, family history, immunizations, medications and social history were updated in the electronic medical record today. All recent labs, ED visits and hospitalizations within the last year were reviewed.  History of atrial fibrillation s/p ablation/hyperlipidemia: Patient reports he still doing well since his ablation.He does report compliance with Crestor 5 mg. Lipids: 12/05/2017 total cholesterol 173, HDL 43, LDL 103, triglycerides 135   Osteopenia/vitamin D deficiency:   DEXA   2020 with osteopenia (-1.4), rpt 5 yrs patient reports compliance with vitamin D supplementation   GERD: Patient reports compliance with use of Prilosec 20 mg daily, without medication symptoms returned.   Rheumatoid arthritis: He reports overall his rheumatoid arthritis is stable.  He does have some foot discomfort. Established with rheumatology.  Methotrexate, Humira, Mobic   Diabetes:   Patient reports compliance metformin. Never tool farxiga $$ Diagnosed with diabetes May 2018. He has attended the nutrition classes.  Patient denies dizziness, hyperglycemic or hypoglycemic events. Patient denies numbness, tingling in the extremities or  nonhealing wounds of feet.   Health maintenance:  Colonoscopy: last screen 05/17/2020, recommend follow up 10 years. Dr. Lavon Paganini Immunizations:  tdap UTD 11/2017, influenza  administered today,PNA series completed 2020,  Shingrix series completed, COVID (pfizer) completed Infectious disease screening: HIV and Hep C completed PSA:  Lab Results  Component Value Date   PSA 0.59 04/15/2023   PSA 0.61 07/20/2022   PSA 0.49 03/29/2021  , pt was counseled on prostate cancer screenings.  DEXA: 01/2019- 5 yr follow up (rheum) Assistive device: none Oxygen GEX:BMWU Patient has a Dental home. Hospitalizations/ED visits: reviewed     09/30/2023    9:49 AM 09/18/2023   11:43 AM 03/21/2022   10:43 AM 07/20/2020    9:08 AM 02/04/2020    8:57 AM  Depression screen PHQ 2/9  Decreased Interest 0 0 0 0 0  Down, Depressed, Hopeless 0 0 0 0 0  PHQ - 2 Score 0 0 0 0 0  Altered sleeping 0 0     Tired, decreased energy 0 0     Change in appetite 0 0     Feeling bad or failure about yourself  0 0     Trouble concentrating 0 0     Moving slowly or fidgety/restless 0 0     Suicidal thoughts 0 0     PHQ-9 Score 0 0     Difficult doing work/chores Not difficult at all Not difficult at all         09/30/2023    9:49 AM 09/18/2023   11:43 AM  GAD 7 : Generalized Anxiety Score  Nervous, Anxious, on Edge 0 0  Control/stop worrying  0 0  Worry too much - different things 0 0  Trouble relaxing 0 0  Restless 0 0  Easily annoyed or irritable 0 0  Afraid - awful might happen 0 0  Total GAD 7 Score 0 0  Anxiety Difficulty Not difficult at all Not difficult at all          09/30/2023    9:49 AM 09/18/2023   11:42 AM 12/05/2017    8:40 AM 12/05/2017    8:27 AM 12/20/2016    2:37 PM  Fall Risk   Falls in the past year? 0 0 No No No  Number falls in past yr:  0     Injury with Fall?  0     Risk for fall due to :  No Fall Risks     Follow up Falls evaluation completed Falls evaluation completed        Immunization History  Administered Date(s) Administered   Influenza, Seasonal, Injecte, Preservative Fre 04/15/2023   Influenza,inj,Quad PF,6+ Mos 04/15/2017, 04/05/2020, 03/29/2021, 03/27/2022   Influenza-Unspecified 07/28/2015, 03/19/2018   PFIZER(Purple Top)SARS-COV-2 Vaccination 10/02/2019, 10/30/2019, 06/06/2020   Pneumococcal Conjugate-13 08/29/2017   Pneumococcal Polysaccharide-23 08/13/2018   Tdap 12/05/2017   Zoster Recombinant(Shingrix) 08/13/2018, 01/20/2019    Past Medical History:  Diagnosis Date   Closed right ankle fracture    Colon polyp 2011   x3 polyps, pts reports 10 year follow up   Delayed union of metatarsal fracture, left 03/2014   No hardware or surgery   ED (erectile dysfunction) 09/23/2017   Elevated ferritin 2013   479 -->  return to normal   Encounter for long-term (current) use of other medications 09/23/2017   Erectile dysfunction 2013   Had been on Cialis   Generalized osteoarthritis    GERD (gastroesophageal reflux disease) 2013   Hyperlipidemia LDL goal <130    Irregular heart rate 09/23/2017   Neuropathy 09/23/2017   On statin therapy 04/15/2017   Osteopenia 2013   prior h/o of Chronic steroids for 10 years; was on Boniva for 6 months. Now takes calcium/vitamin D daily.   Osteopenia of multiple sites    Other and unspecified hyperlipidemia 09/23/2017   Overweight (BMI 25.0-29.9) 11/22/2016   Peripheral neuropathy 2009   Did not respond to gabapentin by prior records   Prediabetes    RA (rheumatoid arthritis) (HCC) 09/23/2017   Rheumatoid arthritis (HCC) 1998   had been on chronic steroids for about 10 years   Rheumatoid lung (HCC)    pt reports having thoracentesis for rheumatoid lung many years ago.    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HCC)    Thrombocytopenia (HCC) 2013   Mildly low at 130   Type 2 diabetes mellitus without complication, without long-term current use of insulin (HCC) 11/22/2016   Vitamin D deficiency 11/22/2016   Wears glasses     [facility]   Allergies  Allergen Reactions   Bee Venom Other (See Comments)    Angioedema with facial sting only   Penicillins Other (See Comments)    Doesn't know   Sulfa Antibiotics Other (See Comments)    Doesn't know   Past Surgical History:  Procedure Laterality Date   ABLATION OF DYSRHYTHMIC FOCUS  02/07/2018   ATRIAL FIBRILLATION ABLATION N/A 02/07/2018   Procedure: ATRIAL FIBRILLATION ABLATION;  Surgeon: Regan Lemming, MD;  Location: [facility];  Service: Cardiovascular;  Laterality: N/A;   COLONOSCOPY     COLONOSCOPY     NO PAST SURGERIES  Family History  Problem Relation Age of Onset   Arthritis Mother    Diabetes Father    Heart disease Father    Arthritis Sister    Diabetes Sister    Alcohol abuse Brother    Drug abuse Brother    Breast cancer Paternal Aunt    Early death Maternal Grandfather    Colon cancer Neg Hx    Esophageal cancer Neg Hx    Stomach cancer Neg Hx    Rectal cancer Neg Hx    Social History   Social History Narrative   Drinks caffeine, takes a daily vitamin.   Masters degree, Psychiatric nurse.   Exercises routinely.   Wears his seatbelt, firearms locked in the home.   Feels  safe in his relationships.    Allergies as of 09/30/2023       Reactions   Bee Venom Other (See Comments)   Angioedema with facial sting only   Penicillins Other (See Comments)   Doesn't know   Sulfa Antibiotics Other (See Comments)   Doesn't know        Medication List        Accurate as of September 30, 2023 10:19 AM. If you have any questions, ask your nurse or doctor.          STOP taking these medications    predniSONE 10 MG (21) Tbpk tablet Commonly known as: STERAPRED UNI-PAK 21 TAB Stopped by: Felix Pacini       TAKE these medications    aspirin 81 MG chewable tablet Chew 81 mg by mouth daily.   b complex vitamins capsule Take 1 capsule by mouth daily.   calcium-vitamin D 500-200 MG-UNIT tablet Commonly known  as: OSCAL WITH D Take 1 tablet by mouth daily with breakfast.   EPINEPHrine 0.3 mg/0.3 mL Soaj injection Commonly known as: EPI-PEN Inject 0.3 mg into the muscle as needed for anaphylaxis. Started by: Felix Pacini   famotidine 20 MG tablet Commonly known as: PEPCID Take 1 tablet (20 mg total) by mouth 2 (two) times daily.   folic acid 1 MG tablet Commonly known as: FOLVITE Take 1 mg by mouth daily.   Humira (2 Pen) 40 MG/0.4ML pen Generic drug: adalimumab SMARTSIG:40 Milligram(s) SUB-Q Every 2 Weeks   hydrOXYzine 25 MG capsule Commonly known as: VISTARIL Take 1 capsule (25 mg total) by mouth every 8 (eight) hours as needed.   meloxicam 15 MG tablet Commonly known as: MOBIC Take 15 mg by mouth daily.   metFORMIN 500 MG tablet Commonly known as: GLUCOPHAGE TAKE 2 TABLETS EVERY  MORNING AND TAKE 1 TABLET  IN THE AFTERNOON.   methotrexate 2.5 MG tablet Commonly known as: RHEUMATREX Take 8 tablets by mouth once a week.   multivitamin capsule Take 1 capsule by mouth daily. Takes womens for extra calcium   omeprazole 20 MG capsule Commonly known as: PRILOSEC Take 1 capsule (20 mg total) by mouth daily.   rosuvastatin 5 MG tablet Commonly known as: CRESTOR Take 1 tablet (5 mg total) by mouth daily.       All past medical history, surgical history, allergies, family history, immunizations andmedications were updated in the EMR today and reviewed under the history and medication portions of their EMR.     Recent Results (from the past 2160 hours)  POCT glycosylated hemoglobin (Hb A1C)     Status: Abnormal   Collection Time: 09/30/23  9:48 AM  Result Value Ref Range   Hemoglobin A1C 6.8 (A) 4.0 -  5.6 %   HbA1c POC (<> result, manual entry) 6.8 4.0 - 5.6 %   HbA1c, POC (prediabetic range) 6.8 (A) 5.7 - 6.4 %   HbA1c, POC (controlled diabetic range) 6.8 0.0 - 7.0 %    ROS: 14 pt review of systems performed and negative (unless mentioned in an HPI)  Objective: BP  116/78   Pulse 60   Temp 98 F (36.7 C)   Wt 198 lb (89.8 kg)   SpO2 98%   BMI 26.85 kg/m  Physical Exam Vitals and nursing note reviewed.  Constitutional:      General: He is not in acute distress.    Appearance: Normal appearance. He is not ill-appearing, toxic-appearing or diaphoretic.  HENT:     Head: Normocephalic and atraumatic.  Eyes:     General: No scleral icterus.       Right eye: No discharge.        Left eye: No discharge.     Extraocular Movements: Extraocular movements intact.     Pupils: Pupils are equal, round, and reactive to light.  Cardiovascular:     Rate and Rhythm: Normal rate and regular rhythm.  Pulmonary:     Effort: Pulmonary effort is normal. No respiratory distress.     Breath sounds: Normal breath sounds. No wheezing, rhonchi or rales.  Musculoskeletal:     Right lower leg: No edema.     Left lower leg: No edema.  Skin:    General: Skin is warm.     Findings: No rash.  Neurological:     Mental Status: He is alert and oriented to person, place, and time. Mental status is at baseline.  Psychiatric:        Mood and Affect: Mood normal.        Behavior: Behavior normal.        Thought Content: Thought content normal.        Judgment: Judgment normal.     No results found.  Assessment/plan: Bruce Kelly is a 63 y.o. male present for CPE and Chronic Conditions/illness Management Type 2 diabetes mellitus without complication, without long-term current use of insulin (HCC) Stable continue with metformin 500 mg twice a day. Did not take farxiga- $$ - jardiance 10 now on formulary.  PNA series: Prevnar 1302/21/2019, Pneumovax completed 08/13/2018 Flu shot: Flu shot UTD recommended yearly) Urine microalbumin: Collected 04/15/2023 Foot exam: Completed 04/15/2023 Eye exam: UTD 05/2022 [facility] eye care. A1c collected> 6.8  Hyperlipidemia LDL goal <130/On statin therapy/Overweight (BMI 25.0-29.9)/Thrombocytopenia (HCC)/Status post ablation of  atrial fibrillation continue crestor.  Labs due next visit  Gastroesophageal reflux disease, esophagitis presence not specified Stable continue omeprazole 20 mg    Rheumatoid arthritis with positive rheumatoid factor, involving unspecified site (HCC)/ High risk medication use -Rheumatoid arthritis is stable. Marland Kitchen   He is on a regimen of methotrexate, Humira, folate and Mobic through his rheumatologist. B12, folate> neuropathy  Vitamin D deficiency/osteopenia Supplementing dexa due 2025 (rheum)  Allergic reaction - bee sting- mouth swelling.  - epi pen prescribed with proper instructions.   Return in about 7 months (around 04/15/2024) for cpe (20 min), Routine chronic condition follow-up.  Orders Placed This Encounter  Procedures   POCT glycosylated hemoglobin (Hb A1C)   Meds ordered this encounter  Medications   metFORMIN (GLUCOPHAGE) 500 MG tablet    Sig: TAKE 2 TABLETS EVERY  MORNING AND TAKE 1 TABLET  IN THE AFTERNOON.    Dispense:  270 tablet    Refill:  1   omeprazole (PRILOSEC) 20 MG capsule    Sig: Take 1 capsule (20 mg total) by mouth daily.    Dispense:  90 capsule    Refill:  1   rosuvastatin (CRESTOR) 5 MG tablet    Sig: Take 1 tablet (5 mg total) by mouth daily.    Dispense:  90 tablet    Refill:  1   EPINEPHrine 0.3 mg/0.3 mL IJ SOAJ injection    Sig: Inject 0.3 mg into the muscle as needed for anaphylaxis.    Dispense:  1 each    Refill:  0   Referral Orders  No referral(s) requested today    Note is dictated utilizing voice recognition software. Although note has been proof read prior to signing, occasional typographical errors still can be missed. If any questions arise, please do not hesitate to call for verification.  Electronically signed by: Felix Pacini, DO [facility] [place]

## 2023-10-17 DIAGNOSIS — Z79899 Other long term (current) drug therapy: Secondary | ICD-10-CM | POA: Diagnosis not present

## 2023-10-17 DIAGNOSIS — M1991 Primary osteoarthritis, unspecified site: Secondary | ICD-10-CM | POA: Diagnosis not present

## 2023-10-17 DIAGNOSIS — M0579 Rheumatoid arthritis with rheumatoid factor of multiple sites without organ or systems involvement: Secondary | ICD-10-CM | POA: Diagnosis not present

## 2024-01-16 DIAGNOSIS — M0579 Rheumatoid arthritis with rheumatoid factor of multiple sites without organ or systems involvement: Secondary | ICD-10-CM | POA: Diagnosis not present

## 2024-02-24 DIAGNOSIS — Z79899 Other long term (current) drug therapy: Secondary | ICD-10-CM | POA: Diagnosis not present

## 2024-02-24 DIAGNOSIS — M0579 Rheumatoid arthritis with rheumatoid factor of multiple sites without organ or systems involvement: Secondary | ICD-10-CM | POA: Diagnosis not present

## 2024-02-24 DIAGNOSIS — M1991 Primary osteoarthritis, unspecified site: Secondary | ICD-10-CM | POA: Diagnosis not present

## 2024-03-17 ENCOUNTER — Ambulatory Visit: Payer: Self-pay

## 2024-03-17 NOTE — Telephone Encounter (Signed)
 FYI Only or Action Required?: Action required by provider: Refusing disposition, requesting appt Friday, needs call back with further recommendations.  Patient was last seen in primary care on 09/30/2023 by Catherine Fuller A, DO.  Called Nurse Triage reporting chest congestion, lung pain, Cough, mild SOB, and chest pain with cough only.  Symptoms began a week ago.  Interventions attempted: Nothing.  Symptoms are: gradually worsening.  Triage Disposition: See HCP Within 4 Hours (Or PCP Triage)  Patient/caregiver understands and will follow disposition?: No, refuses disposition      Copied from CRM [id number]. Topic: Clinical - Red Word Triage >> Mar 17, 2024  2:54 PM Deleta RAMAN wrote: Red Word that prompted transfer to Nurse Triage: patient is congested. Has chest pain possibly from congestion and lung pain Reason for Disposition  [1] MILD difficulty breathing (e.g., minimal/no SOB at rest, SOB with walking, pulse < 100) AND [2] NEW-onset or WORSE than normal  Answer Assessment - Initial Assessment Questions 1. LOCATION: Where does it hurt?       Chest pain only when cough, feels like where bronchi and lungs are when hurts, been coughing for last week or so right in center 4 inches below trachea 2. RADIATION: Does the pain go anywhere else? (e.g., into neck, jaw, arms, back)     No, just my lungs 3. ONSET: When did the chest pain begin? (Minutes, hours or days)      Cough for a week, pain in past couple days think associated with prolonged coughing 4. PATTERN: Does the pain come and go, or has it been constant since it started?  Does it get worse with exertion?      Comes and goes, During coughing spells, maybe 30 sec-1 min 6. SEVERITY: How bad is the pain?  (e.g., Scale 1-10; mild, moderate, or severe)     Not much like 2-3/10 7. CARDIAC RISK FACTORS: Do you have any history of heart problems or risk factors for heart disease? (e.g., angina, prior heart attack; diabetes,  high blood pressure, high cholesterol, smoker, or strong family history of heart disease)     HTN usually perfect, really don't have hx like that, quit smoking 25 years ago 8. PULMONARY RISK FACTORS: Do you have any history of lung disease?  (e.g., blood clots in lung, asthma, emphysema, birth control pills)     Don't have this history 10. OTHER SYMPTOMS: Do you have any other symptoms? (e.g., dizziness, nausea, vomiting, sweating, fever, difficulty breathing, cough)       No SOB or dizziness A little fatigue No nausea, vomiting, sweating, fever Really non-productive cough, feels like it's stuck there, feels like air is trying to get out, no mucus happening, can't hear chest rattling or anything like that Coughing breath more lately only when coughing Taking deep breath makes me need to cough, but not painful to take deep breath  [place] right now Was feeling this way before the mountains, got here Friday, don't think made it any worse, stayed the same since started Requesting appt on Friday   Advised pt be examined in next 4 hours, pt refusing at this time due to [place] the [place]. Advised pt seek care asap, call back or go to ED if any worsening or new symptoms. Sending message to PCP office with pt request for appt Friday upon their return home.  Protocols used: Chest Pain-A-AH, Breathing Difficulty-A-AH

## 2024-03-17 NOTE — Telephone Encounter (Signed)
 Pt scheduled

## 2024-03-20 ENCOUNTER — Ambulatory Visit (INDEPENDENT_AMBULATORY_CARE_PROVIDER_SITE_OTHER): Admitting: Family Medicine

## 2024-03-20 ENCOUNTER — Encounter: Payer: Self-pay | Admitting: Family Medicine

## 2024-03-20 VITALS — BP 120/70 | HR 79 | Temp 97.2°F | Wt 193.0 lb

## 2024-03-20 DIAGNOSIS — J209 Acute bronchitis, unspecified: Secondary | ICD-10-CM | POA: Diagnosis not present

## 2024-03-20 DIAGNOSIS — R062 Wheezing: Secondary | ICD-10-CM

## 2024-03-20 DIAGNOSIS — R051 Acute cough: Secondary | ICD-10-CM | POA: Diagnosis not present

## 2024-03-20 LAB — POC INFLUENZA A&B (BINAX/QUICKVUE)
Influenza A, POC: NEGATIVE
Influenza B, POC: NEGATIVE

## 2024-03-20 LAB — POC COVID19 BINAXNOW: SARS Coronavirus 2 Ag: NEGATIVE

## 2024-03-20 MED ORDER — PREDNISONE 20 MG PO TABS: ORAL_TABLET | ORAL | 0 refills | Status: DC

## 2024-03-20 MED ORDER — BENZONATATE 200 MG PO CAPS: 200.0000 mg | ORAL_CAPSULE | Freq: Three times a day (TID) | ORAL | 0 refills | Status: DC | PRN

## 2024-03-20 MED ORDER — IPRATROPIUM-ALBUTEROL 0.5-2.5 (3) MG/3ML IN SOLN
3.0000 mL | Freq: Once | RESPIRATORY_TRACT | Status: AC
  Administered 2024-03-20: 3 mL via RESPIRATORY_TRACT

## 2024-03-20 MED ORDER — DOXYCYCLINE HYCLATE 100 MG PO TABS: 100.0000 mg | ORAL_TABLET | Freq: Two times a day (BID) | ORAL | 0 refills | Status: DC

## 2024-03-20 NOTE — Patient Instructions (Addendum)

## 2024-03-20 NOTE — Progress Notes (Signed)
 Bruce Kelly , [birth year], 63 y.o., male MRN: [id number] Patient Care Team    Relationship Specialty Notifications Start End  Bruce Kelly LABOR, DO PCP - General Family Medicine  11/21/16   Bruce Soyla Lunger, MD PCP - Electrophysiology Cardiology Admissions 12/24/17   [facility]  11/22/16   Bruce Rocky PARAS, PA-C (Inactive)  Family Medicine  09/26/17   Bruce Redell PARAS, MD Consulting Physician Cardiology  12/06/17   Bruce Gustav GAILS, MD Consulting Physician Gastroenterology  04/15/23     Chief Complaint  Patient presents with   Cough    Pt has been coughing for almost a week and a half now along with SOB. He states it does hurt when he coughs.     Subjective: Bruce Kelly is a 63 y.o. Pt presents for an OV with complaints of cough of 10 days duration.  Associated symptoms include shortness of breath. Dry cough with wheezing reported.  Pt has tried cough drops to ease their symptoms.  (See ROS)     09/30/2023    9:49 AM 09/18/2023   11:43 AM 03/21/2022   10:43 AM 07/20/2020    9:08 AM 02/04/2020    8:57 AM  Depression screen PHQ 2/9  Decreased Interest 0 0 0 0 0  Down, Depressed, Hopeless 0 0 0 0 0  PHQ - 2 Score 0 0 0 0 0  Altered sleeping 0 0     Tired, decreased energy 0 0     Change in appetite 0 0     Feeling bad or failure about yourself  0 0     Trouble concentrating 0 0     Moving slowly or fidgety/restless 0 0     Suicidal thoughts 0 0     PHQ-9 Score 0 0     Difficult doing work/chores Not difficult at all Not difficult at all       Allergies  Allergen Reactions   Bee Venom Other (See Comments)    Angioedema with facial sting only   Penicillins Other (See Comments)    Doesn't know   Sulfa Antibiotics Other (See Comments)    Doesn't know   Social History   Social History Narrative   Drinks caffeine, takes a daily vitamin.   Masters degree, Psychiatric nurse.   Exercises routinely.   Wears his seatbelt, firearms  locked in the home.   Feels  safe in his relationships.   Past Medical History:  Diagnosis Date   Closed right ankle fracture    Colon polyp 2011   x3 polyps, pts reports 10 year follow up   Delayed union of metatarsal fracture, left 03/2014   No hardware or surgery   ED (erectile dysfunction) 09/23/2017   Elevated ferritin 2013   479 -->  return to normal   Encounter for long-term (current) use of other medications 09/23/2017   Erectile dysfunction 2013   Had been on Cialis   Generalized osteoarthritis    GERD (gastroesophageal reflux disease) 2013   Hyperlipidemia LDL goal <130    Irregular heart rate 09/23/2017   Neuropathy 09/23/2017   On statin therapy 04/15/2017   Osteopenia 2013   prior h/o of Chronic steroids for 10 years; was on Boniva for 6 months. Now takes calcium /vitamin D  daily.   Osteopenia of multiple sites    Other and unspecified hyperlipidemia 09/23/2017   Overweight (BMI 25.0-29.9) 11/22/2016   Peripheral neuropathy 2009  Did not respond to gabapentin by prior records   Prediabetes    RA (rheumatoid arthritis) (HCC) 09/23/2017   Rheumatoid arthritis (HCC) 1998   had been on chronic steroids for about 10 years   Rheumatoid lung (HCC)    pt reports having thoracentesis for rheumatoid lung many years ago.    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HCC)    Thrombocytopenia (HCC) 2013   Mildly low at 130   Type 2 diabetes mellitus without complication, without long-term current use of insulin  (HCC) 11/22/2016   Vitamin D  deficiency 11/22/2016   Wears glasses    [facility]   Past Surgical History:  Procedure Laterality Date   ABLATION OF DYSRHYTHMIC FOCUS  02/07/2018   ATRIAL FIBRILLATION ABLATION N/A 02/07/2018   Procedure: ATRIAL FIBRILLATION ABLATION;  Surgeon: Bruce Soyla Lunger, MD;  Location: [facility];  Service: Cardiovascular;  Laterality: N/A;   COLONOSCOPY     COLONOSCOPY     NO PAST SURGERIES     Family History  Problem Relation Age of Onset    Arthritis Mother    Diabetes Father    Heart disease Father    Arthritis Sister    Diabetes Sister    Alcohol abuse Brother    Drug abuse Brother    Breast cancer Paternal Aunt    Early death Maternal Grandfather    Colon cancer Neg Hx    Esophageal cancer Neg Hx    Stomach cancer Neg Hx    Rectal cancer Neg Hx    Allergies as of 03/20/2024       Reactions   Bee Venom Other (See Comments)   Angioedema with facial sting only   Penicillins Other (See Comments)   Doesn't know   Sulfa Antibiotics Other (See Comments)   Doesn't know        Medication List        Accurate as of March 20, 2024 10:53 AM. If you have any questions, ask your nurse or doctor.          STOP taking these medications    famotidine  20 MG tablet Commonly known as: PEPCID  Stopped by: Kelly Bellini   hydrOXYzine  25 MG capsule Commonly known as: VISTARIL  Stopped by: Kelly Bellini       TAKE these medications    aspirin 81 MG chewable tablet Chew 81 mg by mouth daily.   b complex vitamins capsule Take 1 capsule by mouth daily.   benzonatate  200 MG capsule Commonly known as: TESSALON  Take 1 capsule (200 mg total) by mouth 3 (three) times daily as needed for cough. Started by: Saad Buhl   calcium -vitamin D  500-200 MG-UNIT tablet Commonly known as: OSCAL WITH D Take 1 tablet by mouth daily with breakfast.   doxycycline  100 MG tablet Commonly known as: VIBRA -TABS Take 1 tablet (100 mg total) by mouth 2 (two) times daily. Started by: Kelly Bellini   EPINEPHrine  0.3 mg/0.3 mL Soaj injection Commonly known as: EPI-PEN Inject 0.3 mg into the muscle as needed for anaphylaxis.   folic acid  1 MG tablet Commonly known as: FOLVITE  Take 1 mg by mouth daily.   Humira (2 Pen) 40 MG/0.4ML pen Generic drug: adalimumab SMARTSIG:40 Milligram(s) SUB-Q Every 2 Weeks   meloxicam  15 MG tablet Commonly known as: MOBIC  Take 15 mg by mouth daily.   metFORMIN  500 MG tablet Commonly known  as: GLUCOPHAGE  TAKE 2 TABLETS EVERY  MORNING AND TAKE 1 TABLET  IN THE AFTERNOON.   methotrexate  2.5 MG tablet Commonly  known as: RHEUMATREX Take 8 tablets by mouth once a week.   multivitamin capsule Take 1 capsule by mouth daily. Takes womens for extra calcium    omeprazole  20 MG capsule Commonly known as: PRILOSEC Take 1 capsule (20 mg total) by mouth daily.   predniSONE  20 MG tablet Commonly known as: DELTASONE  60 mg x3d, 40 mg x3d, 20 mg x2d, 10 mg x2d Started by: Kelly Bellini   rosuvastatin  5 MG tablet Commonly known as: CRESTOR  Take 1 tablet (5 mg total) by mouth daily.        All past medical history, surgical history, allergies, family history, immunizations andmedications were updated in the EMR today and reviewed under the history and medication portions of their EMR.     Review of Systems  Constitutional:  Positive for chills, fever and malaise/fatigue.  HENT:  Positive for congestion and sore throat. Negative for sinus pain.   Respiratory:  Positive for cough, shortness of breath and wheezing. Negative for sputum production.   Cardiovascular: Negative.   Gastrointestinal:  Negative for abdominal pain, constipation, diarrhea, nausea and vomiting.  Musculoskeletal:  Positive for myalgias.  Skin:  Negative for rash.  Neurological:  Positive for headaches. Negative for dizziness.   Negative, with the exception of above mentioned in HPI   Objective:  BP 120/70   Pulse 79   Temp (!) 97.2 F (36.2 C) (Temporal)   Wt 193 lb (87.5 kg)   SpO2 96%   BMI 26.18 kg/m  Body mass index is 26.18 kg/m. Physical Exam Vitals and nursing note reviewed. Exam conducted with a chaperone present.  Constitutional:      General: He is not in acute distress.    Appearance: Normal appearance. He is not ill-appearing, toxic-appearing or diaphoretic.  HENT:     Head: Normocephalic and atraumatic.     Right Ear: Tympanic membrane, ear canal and external ear normal. There is no  impacted cerumen.     Left Ear: Tympanic membrane, ear canal and external ear normal. There is no impacted cerumen.     Nose: Rhinorrhea present. No congestion.     Mouth/Throat:     Pharynx: Posterior oropharyngeal erythema present. No oropharyngeal exudate.  Eyes:     General: No scleral icterus.       Right eye: No discharge.        Left eye: No discharge.     Extraocular Movements: Extraocular movements intact.     Pupils: Pupils are equal, round, and reactive to light.  Cardiovascular:     Rate and Rhythm: Normal rate and regular rhythm.     Heart sounds: No murmur heard. Pulmonary:     Effort: Pulmonary effort is normal. No respiratory distress.     Breath sounds: Normal breath sounds. No rales.     Comments: Bronchospasm cough present Musculoskeletal:     Cervical back: Neck supple.     Right lower leg: No edema.     Left lower leg: No edema.  Lymphadenopathy:     Cervical: No cervical adenopathy.  Skin:    General: Skin is warm and dry.     Coloration: Skin is not jaundiced or pale.     Findings: No rash.  Neurological:     Mental Status: He is alert and oriented to person, place, and time. Mental status is at baseline.  Psychiatric:        Mood and Affect: Mood normal.        Behavior: Behavior normal.  Thought Content: Thought content normal.        Judgment: Judgment normal.     No results found. No results found. Results for orders placed or performed in visit on 03/20/24 (from the past 24 hours)  POC Influenza A&B (Binax test)     Status: None   Collection Time: 03/20/24 10:38 AM  Result Value Ref Range   Influenza A, POC Negative Negative   Influenza B, POC Negative Negative  POC COVID-19 BinaxNow     Status: None   Collection Time: 03/20/24 10:38 AM  Result Value Ref Range   SARS Coronavirus 2 Ag Negative Negative    Assessment/Plan: Bruce Kelly is a 64 y.o. male present for OV for  Acute cough - POC Influenza A&B (Binax test)- neg - POC  COVID-19 BinaxNow- neg  Wheezing/Acute bronchitis with symptoms > 10 days (Primary) Duoneb treatment provided today> airways opened nicely> cough improved.  Rest, hydrate.  mucinex (DM if cough), nettie pot or nasal saline.  Doxy bid and prednisone  taper prescribed, take until completed.  If cough present it can last up to 4 weeks F/U 2 weeks if not improved.   Reviewed expectations re: course of current medical issues. Discussed self-management of symptoms. Outlined signs and symptoms indicating need for more acute intervention. Patient verbalized understanding and all questions were answered. Patient received an After-Visit Summary.    Orders Placed This Encounter  Procedures   POC Influenza A&B (Binax test)   POC COVID-19 BinaxNow   Meds ordered this encounter  Medications   predniSONE  (DELTASONE ) 20 MG tablet    Sig: 60 mg x3d, 40 mg x3d, 20 mg x2d, 10 mg x2d    Dispense:  18 tablet    Refill:  0   doxycycline  (VIBRA -TABS) 100 MG tablet    Sig: Take 1 tablet (100 mg total) by mouth 2 (two) times daily.    Dispense:  20 tablet    Refill:  0   benzonatate  (TESSALON ) 200 MG capsule    Sig: Take 1 capsule (200 mg total) by mouth 3 (three) times daily as needed for cough.    Dispense:  30 capsule    Refill:  0   ipratropium-albuterol  (DUONEB) 0.5-2.5 (3) MG/3ML nebulizer solution 3 mL   Referral Orders  No referral(s) requested today     Note is dictated utilizing voice recognition software. Although note has been proof read prior to signing, occasional typographical errors still can be missed. If any questions arise, please do not hesitate to call for verification.   electronically signed by:  Kelly Bellini, DO  [facility] - [place]

## 2024-03-31 ENCOUNTER — Encounter: Payer: Self-pay | Admitting: Family Medicine

## 2024-04-03 ENCOUNTER — Ambulatory Visit (HOSPITAL_BASED_OUTPATIENT_CLINIC_OR_DEPARTMENT_OTHER)
Admission: RE | Admit: 2024-04-03 | Discharge: 2024-04-03 | Disposition: A | Discharge status: Home / Self Care | Source: Ambulatory Visit | Attending: Family Medicine | Admitting: Family Medicine

## 2024-04-03 ENCOUNTER — Ambulatory Visit (INDEPENDENT_AMBULATORY_CARE_PROVIDER_SITE_OTHER): Admitting: Family Medicine

## 2024-04-03 ENCOUNTER — Ambulatory Visit: Payer: Self-pay | Admitting: Family Medicine

## 2024-04-03 ENCOUNTER — Encounter: Payer: Self-pay | Admitting: Family Medicine

## 2024-04-03 VITALS — BP 122/78 | HR 73 | Temp 98.3°F | Wt 184.6 lb

## 2024-04-03 DIAGNOSIS — R062 Wheezing: Secondary | ICD-10-CM

## 2024-04-03 DIAGNOSIS — J209 Acute bronchitis, unspecified: Secondary | ICD-10-CM

## 2024-04-03 DIAGNOSIS — D849 Immunodeficiency, unspecified: Secondary | ICD-10-CM | POA: Insufficient documentation

## 2024-04-03 DIAGNOSIS — R9389 Abnormal findings on diagnostic imaging of other specified body structures: Secondary | ICD-10-CM | POA: Diagnosis not present

## 2024-04-03 DIAGNOSIS — R059 Cough, unspecified: Secondary | ICD-10-CM | POA: Diagnosis not present

## 2024-04-03 MED ORDER — LEVOFLOXACIN 750 MG PO TABS: 750.0000 mg | ORAL_TABLET | Freq: Every day | ORAL | 0 refills | Status: AC

## 2024-04-03 NOTE — Progress Notes (Signed)
 Bruce Kelly , [birth year], 63 y.o., male MRN: [id number] Patient Care Team    Relationship Specialty Notifications Start End  Catherine Charlies LABOR, DO PCP - [facility]  11/21/16   Inocencio Soyla Lunger, MD PCP - Electrophysiology Cardiology Admissions 12/24/17   [facility]  11/22/16   Elnor Rocky PARAS, PA-C (Inactive)  Family Medicine  09/26/17   Monetta Redell PARAS, MD Consulting Physician Cardiology  12/06/17   Shila Gustav GAILS, MD Consulting Physician Gastroenterology  04/15/23     Chief Complaint  Patient presents with   Cough    Pt states he feels he is not improving; persistent cough, body aches, low grade fever in afternoon/evening.      Subjective: Bruce Kelly is a 63 y.o. Pt presents for follow-up on bronchitis illness.  Patient was seen little over 2 weeks ago with bronchitis illness.  He has a history of pneumonia in the past and is a diabetic, that is also immunocompromised on Humira.  Wheezing was rather significant on exam he was treated with a DuoNeb treatment in the office and prescribed doxycycline  and prednisone  x 10 days.  He reports today he still feels ill. Cough worse and feverish with sweating daily in the evening and at night. Prior note 03/20/2024: An OV with complaints of cough of 10 days duration.  Associated symptoms include shortness of breath. Dry cough with wheezing reported.  Pt has tried cough drops to ease their symptoms.  (See ROS)     09/30/2023    9:49 AM 09/18/2023   11:43 AM 03/21/2022   10:43 AM 07/20/2020    9:08 AM 02/04/2020    8:57 AM  Depression screen PHQ 2/9  Decreased Interest 0 0 0 0 0  Down, Depressed, Hopeless 0 0 0 0 0  PHQ - 2 Score 0 0 0 0 0  Altered sleeping 0 0     Tired, decreased energy 0 0     Change in appetite 0 0     Feeling bad or failure about yourself  0 0     Trouble concentrating 0 0     Moving slowly or fidgety/restless 0 0     Suicidal thoughts 0 0     PHQ-9  Score 0 0     Difficult doing work/chores Not difficult at all Not difficult at all       Allergies  Allergen Reactions   Bee Venom Other (See Comments)    Angioedema with facial sting only   Penicillins Other (See Comments)    Doesn't know   Sulfa Antibiotics Other (See Comments)    Doesn't know   Social History   Social History Narrative   Drinks caffeine, takes a daily vitamin.   Masters degree, Psychiatric nurse.   Exercises routinely.   Wears his seatbelt, firearms locked in the home.   Feels  safe in his relationships.   Past Medical History:  Diagnosis Date   Closed right ankle fracture    Colon polyp 2011   x3 polyps, pts reports 10 year follow up   Delayed union of metatarsal fracture, left 03/2014   No hardware or surgery   ED (erectile dysfunction) 09/23/2017   Elevated ferritin 2013   479 -->  return to normal   Encounter for long-term (current) use of other medications 09/23/2017   Erectile dysfunction 2013   Had been on Cialis   Generalized osteoarthritis  GERD (gastroesophageal reflux disease) 2013   Hyperlipidemia LDL goal <130    Irregular heart rate 09/23/2017   Neuropathy 09/23/2017   On statin therapy 04/15/2017   Osteopenia 2013   prior h/o of Chronic steroids for 10 years; was on Boniva for 6 months. Now takes calcium /vitamin D  daily.   Osteopenia of multiple sites    Other and unspecified hyperlipidemia 09/23/2017   Overweight (BMI 25.0-29.9) 11/22/2016   Peripheral neuropathy 2009   Did not respond to gabapentin by prior records   Prediabetes    RA (rheumatoid arthritis) (HCC) 09/23/2017   Rheumatoid arthritis (HCC) 1998   had been on chronic steroids for about 10 years   Rheumatoid lung (HCC)    pt reports having thoracentesis for rheumatoid lung many years ago.    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HCC)    Thrombocytopenia 2013   Mildly low at 130   Type 2 diabetes mellitus without complication, without long-term current use of insulin  (HCC) 11/22/2016    Vitamin D  deficiency 11/22/2016   Wears glasses    [facility]   Past Surgical History:  Procedure Laterality Date   ABLATION OF DYSRHYTHMIC FOCUS  02/07/2018   ATRIAL FIBRILLATION ABLATION N/A 02/07/2018   Procedure: ATRIAL FIBRILLATION ABLATION;  Surgeon: Inocencio Soyla Lunger, MD;  Location: [facility];  Service: Cardiovascular;  Laterality: N/A;   COLONOSCOPY     COLONOSCOPY     NO PAST SURGERIES     Family History  Problem Relation Age of Onset   Arthritis Mother    Diabetes Father    Heart disease Father    Arthritis Sister    Diabetes Sister    Alcohol abuse Brother    Drug abuse Brother    Breast cancer Paternal Aunt    Early death Maternal Grandfather    Colon cancer Neg Hx    Esophageal cancer Neg Hx    Stomach cancer Neg Hx    Rectal cancer Neg Hx    Allergies as of 04/03/2024       Reactions   Bee Venom Other (See Comments)   Angioedema with facial sting only   Penicillins Other (See Comments)   Doesn't know   Sulfa Antibiotics Other (See Comments)   Doesn't know        Medication List        Accurate as of April 03, 2024 12:55 PM. If you have any questions, ask your nurse or doctor.          STOP taking these medications    benzonatate  200 MG capsule Commonly known as: TESSALON  Stopped by: Charlies Bellini   doxycycline  100 MG tablet Commonly known as: VIBRA -TABS Stopped by: Charlies Bellini   predniSONE  20 MG tablet Commonly known as: DELTASONE  Stopped by: Charlies Bellini       TAKE these medications    aspirin 81 MG chewable tablet Chew 81 mg by mouth daily.   b complex vitamins capsule Take 1 capsule by mouth daily.   calcium -vitamin D  500-200 MG-UNIT tablet Commonly known as: OSCAL WITH D Take 1 tablet by mouth daily with breakfast.   EPINEPHrine  0.3 mg/0.3 mL Soaj injection Commonly known as: EPI-PEN Inject 0.3 mg into the muscle as needed for anaphylaxis.   folic acid  1 MG tablet Commonly known as: FOLVITE  Take  1 mg by mouth daily.   Humira (2 Pen) 40 MG/0.4ML pen Generic drug: adalimumab SMARTSIG:40 Milligram(s) SUB-Q Every 2 Weeks   levofloxacin  750 MG tablet Commonly known as: Levaquin   Take 1 tablet (750 mg total) by mouth daily for 5 days. Started by: Charlies Bellini   meloxicam  15 MG tablet Commonly known as: MOBIC  Take 15 mg by mouth daily.   metFORMIN  500 MG tablet Commonly known as: GLUCOPHAGE  TAKE 2 TABLETS EVERY  MORNING AND TAKE 1 TABLET  IN THE AFTERNOON.   methotrexate  2.5 MG tablet Commonly known as: RHEUMATREX Take 8 tablets by mouth once a week.   multivitamin capsule Take 1 capsule by mouth daily. Takes womens for extra calcium    omeprazole  20 MG capsule Commonly known as: PRILOSEC Take 1 capsule (20 mg total) by mouth daily.   rosuvastatin  5 MG tablet Commonly known as: CRESTOR  Take 1 tablet (5 mg total) by mouth daily.        All past medical history, surgical history, allergies, family history, immunizations andmedications were updated in the EMR today and reviewed under the history and medication portions of their EMR.     Review of Systems  All other systems reviewed and are negative.  Negative, with the exception of above mentioned in HPI   Objective:  BP 122/78   Pulse 73   Temp 98.3 F (36.8 C)   Wt 184 lb 9.6 oz (83.7 kg)   SpO2 96%   BMI 25.04 kg/m  Body mass index is 25.04 kg/m. Physical Exam Vitals and nursing note reviewed. Exam conducted with a chaperone present.  Constitutional:      Kelly: He is not in acute distress.    Appearance: Normal appearance. He is not ill-appearing, toxic-appearing or diaphoretic.     Comments: Clammy   HENT:     Head: Normocephalic and atraumatic.     Right Ear: Tympanic membrane, ear canal and external ear normal.     Left Ear: Tympanic membrane, ear canal and external ear normal.     Nose: Nose normal. No congestion or rhinorrhea.     Mouth/Throat:     Mouth: Mucous membranes are moist.      Pharynx: No oropharyngeal exudate or posterior oropharyngeal erythema.  Eyes:     Kelly: No scleral icterus.       Right eye: No discharge.        Left eye: No discharge.     Extraocular Movements: Extraocular movements intact.     Pupils: Pupils are equal, round, and reactive to light.  Cardiovascular:     Rate and Rhythm: Normal rate and regular rhythm.     Heart sounds: No murmur heard. Pulmonary:     Effort: Pulmonary effort is normal. No respiratory distress.     Breath sounds: Wheezing, rhonchi and rales present.  Musculoskeletal:     Cervical back: Neck supple.  Lymphadenopathy:     Cervical: No cervical adenopathy.  Skin:    Kelly: Skin is warm and dry.     Coloration: Skin is not jaundiced or pale.     Findings: No rash.  Neurological:     Mental Status: He is alert and oriented to person, place, and time. Mental status is at baseline.  Psychiatric:        Mood and Affect: Mood normal.        Behavior: Behavior normal.        Thought Content: Thought content normal.        Judgment: Judgment normal.     No results found. DG Chest 2 View Result Date: 04/03/2024 EXAM: 2 VIEW(S) XRAY OF THE CHEST 04/03/2024 08:29:20 AM COMPARISON: None available. CLINICAL HISTORY: cough >  3 weeks, concern for pna b/l rales. Per pt antibiotics and steroids finished last week did not help. Hx - DM FINDINGS: LUNGS AND PLEURA: Asymmetric elevation of right hemidiaphragm. No focal pulmonary opacity. No pulmonary edema. No pleural effusion. No pneumothorax. HEART AND MEDIASTINUM: No acute abnormality of the cardiac and mediastinal silhouettes. BONES AND SOFT TISSUES: No acute osseous abnormality. IMPRESSION: 1. No acute cardiopulmonary process. 2. Asymmetric elevation of the right hemidiaphragm. Electronically signed by: Waddell Calk MD 04/03/2024 08:34 AM EDT RP Workstation: [id number]   No results found for this or any previous visit (from the past 24 hours).   Assessment/Plan: Bruce Kelly is a 63 y.o. male present for OV for  Wheezing/Acute bronchitis with symptoms > 10 days (Primary) Despite efforts I have concerns by exam today he has progressed into PNA.  >>CXR STAT>>> pt will be called with results today and further plan discussed Chest x-ray is read as no acute cardiopulmonary process.  I still suspect early pneumonia on my interpretation of chest x-ray today and exam LQ 750 x 5 days prescribed follow-up 1 week  Reviewed expectations re: course of current medical issues. Discussed self-management of symptoms. Outlined signs and symptoms indicating need for more acute intervention. Patient verbalized understanding and all questions were answered. Patient received an After-Visit Summary.    Orders Placed This Encounter  Procedures   DG Chest 2 View   Meds ordered this encounter  Medications   levofloxacin  (LEVAQUIN ) 750 MG tablet    Sig: Take 1 tablet (750 mg total) by mouth daily for 5 days.    Dispense:  5 tablet    Refill:  0   Referral Orders  No referral(s) requested today     Note is dictated utilizing voice recognition software. Although note has been proof read prior to signing, occasional typographical errors still can be missed. If any questions arise, please do not hesitate to call for verification.   electronically signed by:  Charlies Bellini, DO  [facility] - [place]

## 2024-04-03 NOTE — Telephone Encounter (Signed)
 Please call patient His chest x-ray was read as normal.  I did review the image myself and I believe he is developing early pneumonia, most prominent in his left lower lobe. Hydrate well.  Rest. Called in antibiotic called Levaquin  taken once a day for 5 days Continue Mucinex dm or like product Follow-up in 7-10 days

## 2024-04-03 NOTE — Patient Instructions (Signed)

## 2024-04-13 NOTE — Progress Notes (Unsigned)
 Bruce Kelly , 10/02/1960, 63 y.o., male MRN: [id number] Patient Care Team    Relationship Specialty Notifications Start End  Catherine Charlies LABOR, DO PCP - General Family Medicine  11/21/16   Bruce Soyla Lunger, MD PCP - Electrophysiology Cardiology Admissions 12/24/17   [facility]  11/22/16   Elnor Rocky PARAS, PA-C (Inactive)  Family Medicine  09/26/17   Monetta Redell PARAS, MD Consulting Physician Cardiology  12/06/17   Shila Gustav GAILS, MD Consulting Physician Gastroenterology  04/15/23     No chief complaint on file.    Subjective: Bruce Kelly is a 63 y.o. Pt presents for follow-up on bronchitis illness.  Patient was seen little over 2 weeks ago with bronchitis illness.  He has a history of pneumonia in the past and is a diabetic, that is also immunocompromised on Humira.  Wheezing was rather significant on exam he was treated with a DuoNeb treatment in the office and prescribed doxycycline  and prednisone  x 10 days.  He reports today he still feels ill. Cough worse and feverish with sweating daily in the evening and at night. Prior note 03/20/2024: An OV with complaints of cough of 10 days duration.  Associated symptoms include shortness of breath. Dry cough with wheezing reported.  Pt has tried cough drops to ease their symptoms.  (See ROS)     09/30/2023    9:49 AM 09/18/2023   11:43 AM 03/21/2022   10:43 AM 07/20/2020    9:08 AM 02/04/2020    8:57 AM  Depression screen PHQ 2/9  Decreased Interest 0 0 0 0 0  Down, Depressed, Hopeless 0 0 0 0 0  PHQ - 2 Score 0 0 0 0 0  Altered sleeping 0 0     Tired, decreased energy 0 0     Change in appetite 0 0     Feeling bad or failure about yourself  0 0     Trouble concentrating 0 0     Moving slowly or fidgety/restless 0 0     Suicidal thoughts 0 0     PHQ-9 Score 0 0     Difficult doing work/chores Not difficult at all Not difficult at all       Allergies  Allergen Reactions   Bee Venom  Other (See Comments)    Angioedema with facial sting only   Penicillins Other (See Comments)    Doesn't know   Sulfa Antibiotics Other (See Comments)    Doesn't know   Social History   Social History Narrative   Drinks caffeine, takes a daily vitamin.   Masters degree, Psychiatric nurse.   Exercises routinely.   Wears his seatbelt, firearms locked in the home.   Feels  safe in his relationships.   Past Medical History:  Diagnosis Date   Closed right ankle fracture    Colon polyp 2011   x3 polyps, pts reports 10 year follow up   Delayed union of metatarsal fracture, left 03/2014   No hardware or surgery   ED (erectile dysfunction) 09/23/2017   Elevated ferritin 2013   479 -->  return to normal   Encounter for long-term (current) use of other medications 09/23/2017   Erectile dysfunction 2013   Had been on Cialis   Generalized osteoarthritis    GERD (gastroesophageal reflux disease) 2013   Hyperlipidemia LDL goal <130    Irregular heart rate 09/23/2017   Neuropathy 09/23/2017   On statin  therapy 04/15/2017   Osteopenia 2013   prior h/o of Chronic steroids for 10 years; was on Boniva for 6 months. Now takes calcium /vitamin D  daily.   Osteopenia of multiple sites    Other and unspecified hyperlipidemia 09/23/2017   Overweight (BMI 25.0-29.9) 11/22/2016   Peripheral neuropathy 2009   Did not respond to gabapentin by prior records   Prediabetes    RA (rheumatoid arthritis) (HCC) 09/23/2017   Rheumatoid arthritis (HCC) 1998   had been on chronic steroids for about 10 years   Rheumatoid lung (HCC)    pt reports having thoracentesis for rheumatoid lung many years ago.    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HCC)    Thrombocytopenia 2013   Mildly low at 130   Type 2 diabetes mellitus without complication, without long-term current use of insulin  (HCC) 11/22/2016   Vitamin D  deficiency 11/22/2016   Wears glasses    [facility]   Past Surgical History:  Procedure Laterality Date   ABLATION OF  DYSRHYTHMIC FOCUS  02/07/2018   ATRIAL FIBRILLATION ABLATION N/A 02/07/2018   Procedure: ATRIAL FIBRILLATION ABLATION;  Surgeon: Bruce Soyla Lunger, MD;  Location: [facility];  Service: Cardiovascular;  Laterality: N/A;   COLONOSCOPY     COLONOSCOPY     NO PAST SURGERIES     Family History  Problem Relation Age of Onset   Arthritis Mother    Diabetes Father    Heart disease Father    Arthritis Sister    Diabetes Sister    Alcohol abuse Brother    Drug abuse Brother    Breast cancer Paternal Aunt    Early death Maternal Grandfather    Colon cancer Neg Hx    Esophageal cancer Neg Hx    Stomach cancer Neg Hx    Rectal cancer Neg Hx    Allergies as of 04/14/2024       Reactions   Bee Venom Other (See Comments)   Angioedema with facial sting only   Penicillins Other (See Comments)   Doesn't know   Sulfa Antibiotics Other (See Comments)   Doesn't know        Medication List        Accurate as of April 13, 2024 12:52 PM. If you have any questions, ask your nurse or doctor.          aspirin 81 MG chewable tablet Chew 81 mg by mouth daily.   b complex vitamins capsule Take 1 capsule by mouth daily.   calcium -vitamin D  500-200 MG-UNIT tablet Commonly known as: OSCAL WITH D Take 1 tablet by mouth daily with breakfast.   EPINEPHrine  0.3 mg/0.3 mL Soaj injection Commonly known as: EPI-PEN Inject 0.3 mg into the muscle as needed for anaphylaxis.   folic acid  1 MG tablet Commonly known as: FOLVITE  Take 1 mg by mouth daily.   Humira (2 Pen) 40 MG/0.4ML pen Generic drug: adalimumab SMARTSIG:40 Milligram(s) SUB-Q Every 2 Weeks   meloxicam  15 MG tablet Commonly known as: MOBIC  Take 15 mg by mouth daily.   metFORMIN  500 MG tablet Commonly known as: GLUCOPHAGE  TAKE 2 TABLETS EVERY  MORNING AND TAKE 1 TABLET  IN THE AFTERNOON.   methotrexate  2.5 MG tablet Commonly known as: RHEUMATREX Take 8 tablets by mouth once a week.   multivitamin capsule Take 1  capsule by mouth daily. Takes womens for extra calcium    omeprazole  20 MG capsule Commonly known as: PRILOSEC Take 1 capsule (20 mg total) by mouth daily.   rosuvastatin   5 MG tablet Commonly known as: CRESTOR  Take 1 tablet (5 mg total) by mouth daily.        All past medical history, surgical history, allergies, family history, immunizations andmedications were updated in the EMR today and reviewed under the history and medication portions of their EMR.     Review of Systems  All other systems reviewed and are negative.  Negative, with the exception of above mentioned in HPI   Objective:  There were no vitals taken for this visit. There is no height or weight on file to calculate BMI. Physical Exam Vitals and nursing note reviewed. Exam conducted with a chaperone present.  Constitutional:      General: He is not in acute distress.    Appearance: Normal appearance. He is not ill-appearing, toxic-appearing or diaphoretic.     Comments: Clammy   HENT:     Head: Normocephalic and atraumatic.     Right Ear: Tympanic membrane, ear canal and external ear normal.     Left Ear: Tympanic membrane, ear canal and external ear normal.     Nose: Nose normal. No congestion or rhinorrhea.     Mouth/Throat:     Mouth: Mucous membranes are moist.     Pharynx: No oropharyngeal exudate or posterior oropharyngeal erythema.  Eyes:     General: No scleral icterus.       Right eye: No discharge.        Left eye: No discharge.     Extraocular Movements: Extraocular movements intact.     Pupils: Pupils are equal, round, and reactive to light.  Cardiovascular:     Rate and Rhythm: Normal rate and regular rhythm.     Heart sounds: No murmur heard. Pulmonary:     Effort: Pulmonary effort is normal. No respiratory distress.     Breath sounds: Wheezing, rhonchi and rales present.  Musculoskeletal:     Cervical back: Neck supple.  Lymphadenopathy:     Cervical: No cervical adenopathy.  Skin:     General: Skin is warm and dry.     Coloration: Skin is not jaundiced or pale.     Findings: No rash.  Neurological:     Mental Status: He is alert and oriented to person, place, and time. Mental status is at baseline.  Psychiatric:        Mood and Affect: Mood normal.        Behavior: Behavior normal.        Thought Content: Thought content normal.        Judgment: Judgment normal.     No results found. No results found.  No results found for this or any previous visit (from the past 24 hours).   Assessment/Plan: VASILY FEDEWA is a 63 y.o. male present for OV for  Wheezing/Acute bronchitis with symptoms > 10 days (Primary) Despite efforts I have concerns by exam today he has progressed into PNA.  >>CXR STAT>>> pt will be called with results today and further plan discussed Chest x-ray is read as no acute cardiopulmonary process.  I still suspect early pneumonia on my interpretation of chest x-ray today and exam LQ 750 x 5 days prescribed follow-up 1 week  Reviewed expectations re: course of current medical issues. Discussed self-management of symptoms. Outlined signs and symptoms indicating need for more acute intervention. Patient verbalized understanding and all questions were answered. Patient received an After-Visit Summary.    No orders of the defined types were placed in this encounter.  No orders of the defined types were placed in this encounter.  Referral Orders  No referral(s) requested today     Note is dictated utilizing voice recognition software. Although note has been proof read prior to signing, occasional typographical errors still can be missed. If any questions arise, please do not hesitate to call for verification.   electronically signed by:  Charlies Bellini, DO  [facility] - [place]

## 2024-04-14 ENCOUNTER — Encounter: Payer: Self-pay | Admitting: Family Medicine

## 2024-04-14 ENCOUNTER — Ambulatory Visit (INDEPENDENT_AMBULATORY_CARE_PROVIDER_SITE_OTHER): Admitting: Family Medicine

## 2024-04-14 ENCOUNTER — Other Ambulatory Visit: Payer: Self-pay | Admitting: Family Medicine

## 2024-04-14 VITALS — BP 118/76 | HR 72 | Temp 98.1°F | Wt 186.0 lb

## 2024-04-14 DIAGNOSIS — J209 Acute bronchitis, unspecified: Secondary | ICD-10-CM | POA: Diagnosis not present

## 2024-04-14 DIAGNOSIS — R062 Wheezing: Secondary | ICD-10-CM | POA: Diagnosis not present

## 2024-04-14 MED ORDER — BUDESONIDE-FORMOTEROL FUMARATE 80-4.5 MCG/ACT IN AERO: 2.0000 | INHALATION_SPRAY | Freq: Every day | RESPIRATORY_TRACT | 2 refills | Status: DC

## 2024-04-14 NOTE — Patient Instructions (Signed)

## 2024-04-15 ENCOUNTER — Other Ambulatory Visit: Payer: Self-pay | Admitting: Family Medicine

## 2024-05-06 DIAGNOSIS — Z79899 Other long term (current) drug therapy: Secondary | ICD-10-CM | POA: Diagnosis not present

## 2024-05-06 DIAGNOSIS — M1991 Primary osteoarthritis, unspecified site: Secondary | ICD-10-CM | POA: Diagnosis not present

## 2024-05-06 DIAGNOSIS — M0579 Rheumatoid arthritis with rheumatoid factor of multiple sites without organ or systems involvement: Secondary | ICD-10-CM | POA: Diagnosis not present

## 2024-05-10 ENCOUNTER — Other Ambulatory Visit: Payer: Self-pay | Admitting: Family Medicine

## 2024-05-11 ENCOUNTER — Encounter: Admitting: Family Medicine

## 2024-07-05 ENCOUNTER — Other Ambulatory Visit: Payer: Self-pay | Admitting: Family Medicine

## 2024-07-24 ENCOUNTER — Other Ambulatory Visit: Payer: Self-pay | Admitting: Family Medicine

## 2024-08-04 ENCOUNTER — Encounter: Admitting: Family Medicine

## 2024-08-05 ENCOUNTER — Encounter: Payer: Self-pay | Admitting: Family Medicine

## 2024-08-05 ENCOUNTER — Ambulatory Visit (INDEPENDENT_AMBULATORY_CARE_PROVIDER_SITE_OTHER): Admitting: Family Medicine

## 2024-08-05 VITALS — BP 116/76 | HR 68 | Temp 98.2°F | Ht 72.0 in | Wt 199.4 lb

## 2024-08-05 DIAGNOSIS — M8589 Other specified disorders of bone density and structure, multiple sites: Secondary | ICD-10-CM | POA: Diagnosis not present

## 2024-08-05 DIAGNOSIS — Z125 Encounter for screening for malignant neoplasm of prostate: Secondary | ICD-10-CM | POA: Diagnosis not present

## 2024-08-05 DIAGNOSIS — Z79899 Other long term (current) drug therapy: Secondary | ICD-10-CM

## 2024-08-05 DIAGNOSIS — M0579 Rheumatoid arthritis with rheumatoid factor of multiple sites without organ or systems involvement: Secondary | ICD-10-CM | POA: Diagnosis not present

## 2024-08-05 DIAGNOSIS — Z23 Encounter for immunization: Secondary | ICD-10-CM

## 2024-08-05 DIAGNOSIS — E785 Hyperlipidemia, unspecified: Secondary | ICD-10-CM | POA: Diagnosis not present

## 2024-08-05 DIAGNOSIS — Z7984 Long term (current) use of oral hypoglycemic drugs: Secondary | ICD-10-CM

## 2024-08-05 DIAGNOSIS — Z5181 Encounter for therapeutic drug level monitoring: Secondary | ICD-10-CM

## 2024-08-05 DIAGNOSIS — D849 Immunodeficiency, unspecified: Secondary | ICD-10-CM | POA: Diagnosis not present

## 2024-08-05 DIAGNOSIS — E559 Vitamin D deficiency, unspecified: Secondary | ICD-10-CM | POA: Diagnosis not present

## 2024-08-05 DIAGNOSIS — D696 Thrombocytopenia, unspecified: Secondary | ICD-10-CM

## 2024-08-05 DIAGNOSIS — Z0000 Encounter for general adult medical examination without abnormal findings: Secondary | ICD-10-CM

## 2024-08-05 DIAGNOSIS — E1169 Type 2 diabetes mellitus with other specified complication: Secondary | ICD-10-CM | POA: Diagnosis not present

## 2024-08-05 DIAGNOSIS — K219 Gastro-esophageal reflux disease without esophagitis: Secondary | ICD-10-CM

## 2024-08-05 LAB — LIPID PANEL
Cholesterol: 144 mg/dL (ref 28–200)
HDL: 41.7 mg/dL
LDL Cholesterol: 60 mg/dL (ref 10–99)
NonHDL: 101.83
Total CHOL/HDL Ratio: 3
Triglycerides: 208 mg/dL — ABNORMAL HIGH (ref 10.0–149.0)
VLDL: 41.6 mg/dL — ABNORMAL HIGH (ref 0.0–40.0)

## 2024-08-05 LAB — COMPREHENSIVE METABOLIC PANEL WITH GFR
ALT: 21 U/L (ref 3–53)
AST: 21 U/L (ref 5–37)
Albumin: 4.3 g/dL (ref 3.5–5.2)
Alkaline Phosphatase: 71 U/L (ref 39–117)
BUN: 16 mg/dL (ref 6–23)
CO2: 31 meq/L (ref 19–32)
Calcium: 9.4 mg/dL (ref 8.4–10.5)
Chloride: 101 meq/L (ref 96–112)
Creatinine, Ser: 0.87 mg/dL (ref 0.40–1.50)
GFR: 90.82 mL/min
Glucose, Bld: 203 mg/dL — ABNORMAL HIGH (ref 70–99)
Potassium: 4.6 meq/L (ref 3.5–5.1)
Sodium: 138 meq/L (ref 135–145)
Total Bilirubin: 0.8 mg/dL (ref 0.2–1.2)
Total Protein: 6.8 g/dL (ref 6.0–8.3)

## 2024-08-05 LAB — CBC
HCT: 44.4 % (ref 39.0–52.0)
Hemoglobin: 15.1 g/dL (ref 13.0–17.0)
MCHC: 34.1 g/dL (ref 30.0–36.0)
MCV: 93.3 fl (ref 78.0–100.0)
Platelets: 136 10*3/uL — ABNORMAL LOW (ref 150.0–400.0)
RBC: 4.76 Mil/uL (ref 4.22–5.81)
RDW: 14.2 % (ref 11.5–15.5)
WBC: 5.8 10*3/uL (ref 4.0–10.5)

## 2024-08-05 LAB — HEMOGLOBIN A1C: Hgb A1c MFr Bld: 8.6 % — ABNORMAL HIGH (ref 4.6–6.5)

## 2024-08-05 LAB — MAGNESIUM: Magnesium: 1.7 mg/dL (ref 1.5–2.5)

## 2024-08-05 LAB — MICROALBUMIN / CREATININE URINE RATIO
Creatinine,U: 79.4 mg/dL
Microalb Creat Ratio: UNDETERMINED mg/g (ref 0.0–30.0)
Microalb, Ur: <0.7 mg/dL

## 2024-08-05 MED ORDER — EPINEPHRINE 0.3 MG/0.3ML IJ SOAJ: 0.3000 mg | INTRAMUSCULAR | 0 refills | Status: AC | PRN

## 2024-08-05 MED ORDER — METFORMIN HCL 500 MG PO TABS: ORAL_TABLET | ORAL | 1 refills | Status: AC

## 2024-08-05 MED ORDER — OMEPRAZOLE 20 MG PO CPDR: 20.0000 mg | DELAYED_RELEASE_CAPSULE | Freq: Every day | ORAL | 1 refills | Status: AC

## 2024-08-05 MED ORDER — ROSUVASTATIN CALCIUM 5 MG PO TABS: 5.0000 mg | ORAL_TABLET | Freq: Every day | ORAL | 1 refills | Status: AC

## 2024-08-05 NOTE — Patient Instructions (Addendum)
 Return in about 24 weeks (around 01/20/2025) for Routine chronic condition follow-up.        Great to see you today.  I have refilled the medication(s) we provide.   If labs were collected or images ordered, we will inform you of  results once we have received them and reviewed. We will contact you either by echart message, or telephone call.  Please give ample time to the testing facility, and our office to run,  receive and review results. Please do not call inquiring of results, even if you can see them in your chart. We will contact you as soon as we are able. If it has been over 1 week since the test was completed, and you have not yet heard from us , then please call us .    - echart message- for normal results that have been seen by the patient already.   - telephone call: abnormal results or if patient has not viewed results in their echart.  If a referral to a specialist was entered for you, please call us  in 2 weeks if you have not heard from the specialist office to schedule.

## 2024-08-05 NOTE — Progress Notes (Signed)
 Follow-up    Patient ID: Bruce Kelly, male  DOB: [birth year], 64 y.o.   MRN: [id number] Patient Care Team    Relationship Specialty Notifications Start End  Catherine Charlies LABOR, DO PCP - General Family Medicine  11/21/16   [facility]  Opt[facility]  11/22/16   Monetta Redell PARAS, MD Consulting Physician Cardiology  12/06/17   Shila Gustav GAILS, MD Consulting Physician Gastroenterology  04/15/23   Inocencio Soyla Lunger, MD Consulting Physician Cardiology  08/05/24     Chief Complaint  Patient presents with   Annual Exam    Chronic Conditions/illness Management.  Pt is fasting.     Subjective:  Bruce Kelly is a 64 y.o. male present for CPE and Chronic Conditions/illness Management All past medical history, surgical history, allergies, family history, immunizations, medications and social history were updated in the electronic medical record today. All recent labs, ED visits and hospitalizations within the last year were reviewed. Medication reconciliation completed  History of atrial fibrillation s/p ablation/hyperlipidemia: Patient reports compliance with Crestor  5 mg. Denies any return of A-fib.Patient denies chest pain, shortness of breath, dizziness or lower extremity edema.     Osteopenia/vitamin D  deficiency:   DEXA   2020 with osteopenia (-1.4), rpt 5 yrs patient reports compliance with vitamin D  supplementation   GERD/chronic proton pump inhibitor use: Patient reports compliance with use of Prilosec 20 mg daily, without medication symptoms return   Rheumatoid arthritis: He reports overall his rheumatoid arthritis is stable. Established with rheumatology, which prescribes his methotrexate , Humira, Mobic  and folate   Diabetes:   Patient reports compliance metformin  500 mg twice daily. Never started farxiga  $$ Diagnosed with diabetes May 2018. He has attended the nutrition classes.  Patient denies dizziness, hyperglycemic or hypoglycemic events. Patient  denies numbness, tingling in the extremities or nonhealing wounds of feet.   Health maintenance:  Colonoscopy: last screen 05/17/2020, recommend follow up 10 years. Dr. Shila Immunizations:  tdap UTD 11/2017, influenza completed today,PNA after 63 2020,  Shingrix  series completed Infectious disease screening: HIV and Hep C completed PSA:  Lab Results  Component Value Date   PSA 0.59 04/15/2023   PSA 0.61 07/20/2022   PSA 0.49 03/29/2021  , pt was counseled on prostate cancer screenings.  DEXA: 01/2019- 5 yr follow up (rheum).  T-score -1.4 Patient has a Dental home. Hospitalizations/ED visits: Reviewed  Flowsheet Row Office Visit from 08/05/2024 in [facility] [place]  PHQ-2 Total Score 0         08/05/2024    8:36 AM 09/30/2023    9:49 AM 09/18/2023   11:42 AM 12/05/2017    8:40 AM 12/05/2017    8:27 AM  Fall Risk   Falls in the past year? 0 0 0 No  No   Number falls in past yr: 0  0    Injury with Fall? 0  0     Risk for fall due to : No Fall Risks  No Fall Risks    Follow up Falls evaluation completed Falls evaluation completed Falls evaluation completed       Data saved with a previous flowsheet row definition    Immunization History  Administered Date(s) Administered   INFLUENZA, HIGH DOSE SEASONAL PF 08/05/2024   Influenza, Seasonal, Injecte, Preservative Fre 04/15/2023   Influenza,inj,Quad PF,6+ Mos 04/15/2017, 04/05/2020, 03/29/2021, 03/27/2022   Influenza-Unspecified 05/03/2010, 07/28/2015, 03/19/2018, 04/05/2020   PFIZER(Purple Top)SARS-COV-2 Vaccination 10/02/2019, 10/30/2019, 06/06/2020   PNEUMOCOCCAL CONJUGATE-20 08/05/2024  Pneumococcal Conjugate-13 08/29/2017   Pneumococcal Polysaccharide-23 08/13/2018   Tdap 12/05/2017   Zoster Recombinant(Shingrix ) 08/13/2018, 01/20/2019    Past Medical History:  Diagnosis Date   Closed right ankle fracture    Colon polyp 2011   x3 polyps, pts reports 10 year follow up   Delayed union of  metatarsal fracture, left 03/2014   No hardware or surgery   ED (erectile dysfunction) 09/23/2017   Elevated ferritin 2013   479 -->  return to normal   Encounter for long-term (current) use of other medications 09/23/2017   Erectile dysfunction 2013   Had been on Cialis   Generalized osteoarthritis    GERD (gastroesophageal reflux disease) 2013   Hyperlipidemia LDL goal <130    Irregular heart rate 09/23/2017   Neuropathy 09/23/2017   On statin therapy 04/15/2017   Osteopenia 2013   prior h/o of Chronic steroids for 10 years; was on Boniva for 6 months. Now takes calcium /vitamin D  daily.   Osteopenia of multiple sites    Other and unspecified hyperlipidemia 09/23/2017   Overweight (BMI 25.0-29.9) 11/22/2016   Peripheral neuropathy 2009   Did not respond to gabapentin by prior records   Prediabetes    RA (rheumatoid arthritis) (HCC) 09/23/2017   Rheumatoid arthritis (HCC) 1998   had been on chronic steroids for about 10 years   Rheumatoid lung (HCC)    pt reports having thoracentesis for rheumatoid lung many years ago.    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HCC)    Thrombocytopenia 2013   Mildly low at 130   Type 2 diabetes mellitus without complication, without long-term current use of insulin  (HCC) 11/22/2016   Vitamin D  deficiency 11/22/2016   Wears glasses    [facility]   Allergies  Allergen Reactions   Bee Venom Other (See Comments)    Angioedema with facial sting only   Penicillins Other (See Comments)    Doesn't know   Sulfa Antibiotics Other (See Comments)    Doesn't know   Past Surgical History:  Procedure Laterality Date   ABLATION OF DYSRHYTHMIC FOCUS  02/07/2018   ATRIAL FIBRILLATION ABLATION N/A 02/07/2018   Procedure: ATRIAL FIBRILLATION ABLATION;  Surgeon: Inocencio Soyla Lunger, MD;  Location: [facility];  Service: Cardiovascular;  Laterality: N/A;   COLONOSCOPY     COLONOSCOPY     NO PAST SURGERIES     Family History  Problem Relation Age of Onset   Arthritis  Mother    Diabetes Father    Heart disease Father    Arthritis Sister    Diabetes Sister    Alcohol abuse Brother    Drug abuse Brother    Breast cancer Paternal Aunt    Early death Maternal Grandfather    Colon cancer Neg Hx    Esophageal cancer Neg Hx    Stomach cancer Neg Hx    Rectal cancer Neg Hx    Social History   Social History Narrative   Drinks caffeine, takes a daily vitamin.   Masters degree, Psychiatric Nurse.   Exercises routinely.   Wears his seatbelt, firearms locked in the home.   Feels  safe in his relationships.    Allergies as of 08/05/2024       Reactions   Bee Venom Other (See Comments)   Angioedema with facial sting only   Penicillins Other (See Comments)   Doesn't know   Sulfa Antibiotics Other (See Comments)   Doesn't know        Medication List  Accurate as of August 05, 2024  9:05 AM. If you have any questions, ask your nurse or doctor.          aspirin 81 MG chewable tablet Chew 81 mg by mouth daily.   b complex vitamins capsule Take 1 capsule by mouth daily.   calcium -vitamin D  500-200 MG-UNIT tablet Commonly known as: OSCAL WITH D Take 1 tablet by mouth daily with breakfast.   EPINEPHrine  0.3 mg/0.3 mL Soaj injection Commonly known as: EPI-PEN Inject 0.3 mg into the muscle as needed for anaphylaxis.   folic acid  1 MG tablet Commonly known as: FOLVITE  Take 1 mg by mouth daily.   Humira (2 Pen) 40 MG/0.4ML pen Generic drug: adalimumab SMARTSIG:40 Milligram(s) SUB-Q Every 2 Weeks   meloxicam  15 MG tablet Commonly known as: MOBIC  Take 15 mg by mouth daily.   metFORMIN  500 MG tablet Commonly known as: GLUCOPHAGE  TAKE 2 TABLETS EVERY  MORNING AND TAKE 1 TABLET  IN THE AFTERNOON. What changed: See the new instructions. Changed by: Charlies Bellini, DO   methotrexate  2.5 MG tablet Commonly known as: RHEUMATREX Take 8 tablets by mouth once a week.   multivitamin capsule Take 1 capsule by mouth daily. Takes womens for  extra calcium    omeprazole  20 MG capsule Commonly known as: PRILOSEC Take 1 capsule (20 mg total) by mouth daily. What changed: how much to take Changed by: Charlies Bellini, DO   rosuvastatin  5 MG tablet Commonly known as: CRESTOR  Take 1 tablet (5 mg total) by mouth daily.   Symbicort  80-4.5 MCG/ACT inhaler Generic drug: budesonide -formoterol  INHALE 2 PUFFS BY MOUTH INTO THE LUNGS DAILY       All past medical history, surgical history, allergies, family history, immunizations andmedications were updated in the EMR today and reviewed under the history and medication portions of their EMR.     No results found for this or any previous visit (from the past 2160 hours).   ROS: 14 pt review of systems performed and negative (unless mentioned in an HPI)  Objective: BP 116/76   Pulse 68   Temp 98.2 F (36.8 C)   Ht 6' (1.829 m)   Wt 199 lb 6.4 oz (90.4 kg)   SpO2 98%   BMI 27.04 kg/m  Physical Exam Vitals and nursing note reviewed. Exam conducted with a chaperone present.  Constitutional:      General: He is not in acute distress.    Appearance: Normal appearance. He is not ill-appearing, toxic-appearing or diaphoretic.  HENT:     Head: Normocephalic and atraumatic.     Right Ear: Tympanic membrane, ear canal and external ear normal. There is no impacted cerumen.     Left Ear: Tympanic membrane, ear canal and external ear normal. There is no impacted cerumen.     Nose: Nose normal. No congestion or rhinorrhea.     Mouth/Throat:     Mouth: Mucous membranes are moist.     Pharynx: Oropharynx is clear. No oropharyngeal exudate or posterior oropharyngeal erythema.  Eyes:     General: No scleral icterus.       Right eye: No discharge.        Left eye: No discharge.     Extraocular Movements: Extraocular movements intact.     Pupils: Pupils are equal, round, and reactive to light.  Cardiovascular:     Rate and Rhythm: Normal rate and regular rhythm.     Pulses: Normal pulses.      Heart sounds: Normal heart sounds.  No murmur heard.    No friction rub. No gallop.  Pulmonary:     Effort: Pulmonary effort is normal. No respiratory distress.     Breath sounds: Normal breath sounds. No stridor. No wheezing, rhonchi or rales.  Chest:     Chest wall: No tenderness.  Abdominal:     General: Abdomen is flat. Bowel sounds are normal. There is no distension.     Palpations: Abdomen is soft. There is no mass.     Tenderness: There is no abdominal tenderness. There is no right CVA tenderness, left CVA tenderness, guarding or rebound.     Hernia: No hernia is present.  Musculoskeletal:        General: No swelling or tenderness. Normal range of motion.     Cervical back: Normal range of motion and neck supple.     Right lower leg: No edema.     Left lower leg: No edema.  Lymphadenopathy:     Cervical: No cervical adenopathy.  Skin:    General: Skin is warm and dry.     Coloration: Skin is not jaundiced.     Findings: No bruising, lesion or rash.  Neurological:     General: No focal deficit present.     Mental Status: He is alert and oriented to person, place, and time. Mental status is at baseline.     Cranial Nerves: No cranial nerve deficit.     Sensory: No sensory deficit.     Motor: No weakness.     Coordination: Coordination normal.     Gait: Gait normal.     Deep Tendon Reflexes: Reflexes normal.  Psychiatric:        Mood and Affect: Mood normal.        Behavior: Behavior normal.        Thought Content: Thought content normal.        Judgment: Judgment normal.    Diabetic Foot Exam - Simple   Simple Foot Form Diabetic Foot exam was performed with the following findings: Yes 08/05/2024  8:55 AM  Visual Inspection No deformities, no ulcerations, no other skin breakdown bilaterally: Yes Sensation Testing Intact to touch and monofilament testing bilaterally: Yes Pulse Check Posterior Tibialis and Dorsalis pulse intact bilaterally: Yes Comments     No  results found.  Assessment/plan: RAMZEY PETROVIC is a 64 y.o. male present for CPE and Chronic Conditions/illness Management Type 2 diabetes mellitus without complication, without long-term current use of insulin  (HCC) Stable Continue metformin  500 mg twice a day. EWJ:Jquzm 65> completed today 08/05/2024 Flu shot: Flu shot UTD 2025 Urine microalbumin: Collected today 08/05/2024 Foot exam: Completed today 07/28/2024 Eye exam: UTD 05/2022 [facility].He is DUE. A1c collected> 7.4 > 7.5 > 6.6 >6.8> collected today  Hyperlipidemia LDL goal <130/On statin therapy/Overweight (BMI 25.0-29.9)/Thrombocytopenia (HCC)/Status post ablation of atrial fibrillation Continue Crestor  CBC, CMP, TSH and lipids collected today  Gastroesophageal reflux disease/long-term PPI use Stable Continue omeprazole  20 mg  B12, folate, vitamin D  and magnesium levels collected today  Rheumatoid arthritis with positive rheumatoid factor, involving unspecified site (HCC)/ High risk medication use - Stable - Regimen of methotrexate , Humira, folate and Mobic  through his rheumatologist. -Continue folate supplementation - B12 and folate levels collected today   Vitamin D  deficiency/osteopenia Supplementing vitamin D  dexa due 2025 (rheum)> ordered - [facility] Vitamin D  levels collected today  Allergic reaction - bee sting- mouth swelling (beekeeper).  - epi pen prescribed with proper instructions.   Prostate cancer screening -  PSA Need for vaccination for pneumococcus - Pneumococcal conjugate vaccine 20-valent Influenza vaccine needed - Flu vaccine HIGH DOSE PF(Fluzone Trivalent)  Routine general medical examination at a health care facility (Primary) Patient was encouraged to exercise greater than 150 minutes a week. Patient was encouraged to choose a diet filled with fresh fruits and vegetables, and lean meats. AVS provided to patient today for education/recommendation on gender specific health and safety  maintenance. Colonoscopy: last screen 05/17/2020, recommend follow up 10 years. Dr. Shila Immunizations:  tdap UTD 11/2017, influenza completed today,PNA after 63 2020,  Shingrix  series completed Infectious disease screening: HIV and Hep C completed  Return in about 24 weeks (around 01/20/2025) for Routine chronic condition follow-up.  Orders Placed This Encounter  Procedures   DG Bone Density   Flu vaccine HIGH DOSE PF(Fluzone Trivalent)   Pneumococcal conjugate vaccine 20-valent   CBC   Comprehensive metabolic panel with GFR   Hemoglobin A1c   Lipid panel   TSH   Urine Microalbumin w/creat. ratio   PSA   B12 and Folate Panel   Magnesium   Vitamin D  (25 hydroxy)   Ambulatory referral to Ophthalmology   Meds ordered this encounter  Medications   metFORMIN  (GLUCOPHAGE ) 500 MG tablet    Sig: TAKE 2 TABLETS EVERY  MORNING AND TAKE 1 TABLET  IN THE AFTERNOON.    Dispense:  270 tablet    Refill:  1   omeprazole  (PRILOSEC) 20 MG capsule    Sig: Take 1 capsule (20 mg total) by mouth daily.    Dispense:  90 capsule    Refill:  1   rosuvastatin  (CRESTOR ) 5 MG tablet    Sig: Take 1 tablet (5 mg total) by mouth daily.    Dispense:  90 tablet    Refill:  1   EPINEPHrine  0.3 mg/0.3 mL IJ SOAJ injection    Sig: Inject 0.3 mg into the muscle as needed for anaphylaxis.    Dispense:  1 each    Refill:  0    Hold until pt request   Referral Orders         Ambulatory referral to Ophthalmology      Note is dictated utilizing voice recognition software. Although note has been proof read prior to signing, occasional typographical errors still can be missed. If any questions arise, please do not hesitate to call for verification.  Electronically signed by: Charlies Bellini, DO [facility]

## 2024-08-06 ENCOUNTER — Ambulatory Visit: Payer: Self-pay | Admitting: Family Medicine

## 2024-08-06 LAB — B12 AND FOLATE PANEL
Folate: >23.4 ng/mL
Vitamin B-12: 607 pg/mL (ref 211–911)

## 2024-08-06 LAB — PSA: PSA: 0.48 ng/mL (ref 0.10–4.00)

## 2024-08-06 LAB — VITAMIN D 25 HYDROXY (VIT D DEFICIENCY, FRACTURES): VITD: 31.38 ng/mL (ref 30.00–100.00)

## 2024-08-06 LAB — TSH: TSH: 1.96 u[IU]/mL (ref 0.35–5.50)

## 2024-08-06 MED ORDER — EMPAGLIFLOZIN 25 MG PO TABS: 25.0000 mg | ORAL_TABLET | Freq: Every day | ORAL | 5 refills | Status: AC

## 2024-08-06 NOTE — Telephone Encounter (Signed)
 Please call patient His A1c has increased a significant amount and is up to 8.6, this is significantly high for him.  I have called in a medication called Jardiance  for him to start along with his metformin .  Jardiance  is a great diabetic medicine that also helps protect heart and kidney function.  We will need to see him back little sooner since his A1c is elevated and we are starting a new medication.  Please schedule him for follow-up in 3.5 months.  - Since he does have Medicare, medication may be more expensive first couple of months until he meets his deductible, then it should reduce since it is on his formulary.  If it is too expensive we can contact our pharmacist to see if there is any assistance he will qualify for.   LDL/bad cholesterol is at goal.  Blood cell counts, liver kidney function and thyroid  function are normal. PSA/prostate cancer screening is normal. Urine protein levels are normal. B12 is normal.  Vitamin D  is at the low end of normal.  He could benefit from increasing his vitamin D  supplement by 1000 units daily. Magnesium is on the low end of normal, he could benefit from starting a low-dose magnesium glycinate 120-240 mg before bed.  This is over-the-counter.

## 2024-11-18 ENCOUNTER — Ambulatory Visit: Admitting: Family Medicine

## 2025-01-20 ENCOUNTER — Ambulatory Visit: Admitting: Family Medicine
